# Patient Record
Sex: Female | Born: 1940
Health system: Southern US, Community
[De-identification: ages and names within clinical notes are randomized; demographics above are authoritative.]

## PROBLEM LIST (undated history)

## (undated) DIAGNOSIS — E785 Hyperlipidemia, unspecified: Secondary | ICD-10-CM

## (undated) DIAGNOSIS — I10 Essential (primary) hypertension: Secondary | ICD-10-CM

## (undated) DIAGNOSIS — J45909 Unspecified asthma, uncomplicated: Secondary | ICD-10-CM

## (undated) DIAGNOSIS — H269 Unspecified cataract: Secondary | ICD-10-CM

## (undated) DIAGNOSIS — R000 Tachycardia, unspecified: Secondary | ICD-10-CM

## (undated) DIAGNOSIS — M8580 Other specified disorders of bone density and structure, unspecified site: Secondary | ICD-10-CM

## (undated) HISTORY — DX: Tachycardia, unspecified: R00.0

## (undated) HISTORY — DX: Hyperlipidemia, unspecified: E78.5

## (undated) HISTORY — PX: ABDOMINAL HYSTERECTOMY: SHX81

## (undated) HISTORY — DX: Unspecified asthma, uncomplicated: J45.909

## (undated) HISTORY — DX: Essential (primary) hypertension: I10

## (undated) HISTORY — PX: EYE SURGERY: SHX253

## (undated) HISTORY — PX: TOE SURGERY: SHX1073

## (undated) HISTORY — DX: Unspecified cataract: H26.9

---
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
## 2001-02-04 ENCOUNTER — Emergency Department (HOSPITAL_COMMUNITY): Admission: EM | Admit: 2001-02-04 | Discharge: 2001-02-05 | Payer: Self-pay | Admitting: Emergency Medicine

## 2001-02-09 ENCOUNTER — Encounter: Admission: RE | Admit: 2001-02-09 | Discharge: 2001-02-09 | Payer: Self-pay | Admitting: General Practice

## 2001-02-09 ENCOUNTER — Encounter: Payer: Self-pay | Admitting: General Practice

## 2009-04-15 ENCOUNTER — Emergency Department (HOSPITAL_COMMUNITY): Admission: EM | Admit: 2009-04-15 | Discharge: 2009-04-15 | Payer: Self-pay | Admitting: Emergency Medicine

## 2011-02-24 HISTORY — PX: EYE SURGERY: SHX253

## 2012-01-18 ENCOUNTER — Ambulatory Visit: Payer: PRIVATE HEALTH INSURANCE

## 2012-01-18 ENCOUNTER — Ambulatory Visit (INDEPENDENT_AMBULATORY_CARE_PROVIDER_SITE_OTHER): Payer: PRIVATE HEALTH INSURANCE | Admitting: Family Medicine

## 2012-01-18 VITALS — BP 118/86 | HR 82 | Temp 98.4°F | Resp 18 | Ht 59.0 in | Wt 220.0 lb

## 2012-01-18 DIAGNOSIS — R062 Wheezing: Secondary | ICD-10-CM

## 2012-01-18 DIAGNOSIS — J45909 Unspecified asthma, uncomplicated: Secondary | ICD-10-CM

## 2012-01-18 DIAGNOSIS — R0989 Other specified symptoms and signs involving the circulatory and respiratory systems: Secondary | ICD-10-CM

## 2012-01-18 LAB — POCT CBC
Granulocyte percent: 68.6 %G (ref 37–80)
HCT, POC: 46.4 % (ref 37.7–47.9)
Hemoglobin: 14 g/dL (ref 12.2–16.2)
Lymph, poc: 2 (ref 0.6–3.4)
MCH, POC: 28.4 pg (ref 27–31.2)
MCHC: 30.2 g/dL — AB (ref 31.8–35.4)
MCV: 94.1 fL (ref 80–97)
MID (cbc): 0.7 (ref 0–0.9)
MPV: 8.5 fL (ref 0–99.8)
POC Granulocyte: 5.9 (ref 2–6.9)
POC LYMPH PERCENT: 23.7 %L (ref 10–50)
POC MID %: 7.7 %M (ref 0–12)
Platelet Count, POC: 237 10*3/uL (ref 142–424)
RBC: 4.93 M/uL (ref 4.04–5.48)
RDW, POC: 15.6 %
WBC: 806 10*3/uL — AB (ref 4.6–10.2)

## 2012-01-18 MED ORDER — AZITHROMYCIN 250 MG PO TABS: ORAL_TABLET | ORAL | Status: DC

## 2012-01-18 MED ORDER — ALBUTEROL SULFATE HFA 108 (90 BASE) MCG/ACT IN AERS: 2.0000 | INHALATION_SPRAY | Freq: Four times a day (QID) | RESPIRATORY_TRACT | Status: DC | PRN

## 2012-01-18 MED ORDER — METHYLPREDNISOLONE SODIUM SUCC 125 MG IJ SOLR
125.0000 mg | Freq: Once | INTRAMUSCULAR | Status: AC
  Administered 2012-01-18: 125 mg via INTRAMUSCULAR

## 2012-01-18 MED ORDER — HYDROCODONE-HOMATROPINE 5-1.5 MG/5ML PO SYRP: 5.0000 mL | ORAL_SOLUTION | Freq: Three times a day (TID) | ORAL | Status: DC | PRN

## 2012-01-18 MED ORDER — ALBUTEROL SULFATE (2.5 MG/3ML) 0.083% IN NEBU
2.5000 mg | INHALATION_SOLUTION | Freq: Once | RESPIRATORY_TRACT | Status: AC
  Administered 2012-01-18: 2.5 mg via RESPIRATORY_TRACT

## 2012-01-18 MED ORDER — PREDNISONE 20 MG PO TABS
ORAL_TABLET | ORAL | Status: DC
Start: 2012-01-18 — End: 2015-03-14

## 2012-01-18 NOTE — Progress Notes (Signed)
Subjective: 71 year old lady who is here with a four-day history of cough wheezing and congestion. She has a history of a previous pneumonia last time she was here. She is starting to cough up more yellowish mucus, having been clear originally. She has felt a little chilled but does not know of having a fever. She likely is on Thursday before this started. She does not smoke and never has. Her husband has not been sick.  Objective: Pleasant elderly lady in no acute distress. She is obese. TMs normal. Throat clear. Neck supple without nodes thyromegaly. Chest has some rhonchi throughout both lungs, with scattered wheezing also. Heart regular without murmurs. Heart was a little hard to hear due to the noisy lungs.  [facility] reading (PRIMARY) by  Dr. Alwyn Ren Prominent bronchovascular markings.   Assessment: Cough Rhonchi Wheezes  Plan: Chest x-ray CBC Nebulizer albuterol treatment  Improved after nebulizer but still a little rhonchous  Will give solumedrol Prednisone taper zpak Hycodan albuterol

## 2012-01-18 NOTE — Patient Instructions (Addendum)
Drink plenty of fluids  Get enough rest  Use the albuterol inhaler every 4-6 hours as needed for wheezing and coughing  Take the antibiotics 2 today, then one daily for 4 days  Use the cough syrup only when needed for bad coughing  Take the prednisone starting tomorrow morning 3 daily for 2 days, 2 daily for 2 days, one daily for 2 days  Return if worse or short of breath

## 2014-06-26 ENCOUNTER — Encounter: Payer: Self-pay | Admitting: Podiatry

## 2014-06-26 ENCOUNTER — Ambulatory Visit (INDEPENDENT_AMBULATORY_CARE_PROVIDER_SITE_OTHER): Payer: PPO | Admitting: Podiatry

## 2014-06-26 VITALS — BP 124/74 | HR 82 | Resp 15

## 2014-06-26 DIAGNOSIS — L600 Ingrowing nail: Secondary | ICD-10-CM | POA: Diagnosis not present

## 2014-06-26 DIAGNOSIS — L84 Corns and callosities: Secondary | ICD-10-CM | POA: Diagnosis not present

## 2014-06-26 NOTE — Progress Notes (Signed)
Subjective:     Patient ID: Gina Guerrero, female   DOB: 1940/04/26, 74 y.o.   MRN: [id number]  HPI patient states the ingrown on both my feet are really bothering me and making it hard to wear shoe gear and I've tried to trim them out myself. Also complains of painful calluses   Review of Systems  All other systems reviewed and are negative.      Objective:   Physical Exam  Constitutional: She is oriented to person, place, and time.  Cardiovascular: Intact distal pulses.   Musculoskeletal: Normal range of motion.  Neurological: She is oriented to person, place, and time.  Skin: Skin is warm.  Nursing note and vitals reviewed.  neurovascular status intact with muscle strength adequate range of motion within normal limits. Patient's noted to have incurvated hallux nails bilateral medial borders that are painful when pressed and makes shoe gear difficult. Patient's noted to have keratotic lesion sub-5 metatarsal bilateral and has good digital perfusion and is well oriented 3     Assessment:     Ingrown toenail deformity hallux bilateral with keratotic lesion sub-fifth metatarsals bilateral that are painful    Plan:     H&P and conditions discussed with patient. Discussed treatment options including permanent correction which patient wants and explained risk of procedure. Today I infiltrated each hallux 60 Milligan times like Marcaine mixture remove the medial borders exposed matrix and applied phenol 3 applications 30 seconds followed by alcohol lavage and sterile dressing. Gave instructions on soaks and reappoint

## 2014-06-26 NOTE — Progress Notes (Signed)
   Subjective:    Patient ID: Gina Guerrero, female    DOB: 20-Apr-1940, 74 y.o.   MRN: [id number]  HPI Pt presents with bilateral ingrown great nails, medial borders, also has painful callus bilateral 5th met   Review of Systems  Musculoskeletal: Positive for back pain.  All other systems reviewed and are negative.      Objective:   Physical Exam        Assessment & Plan:

## 2014-06-26 NOTE — Patient Instructions (Signed)

## 2015-01-24 ENCOUNTER — Ambulatory Visit (INDEPENDENT_AMBULATORY_CARE_PROVIDER_SITE_OTHER): Payer: PPO

## 2015-01-24 ENCOUNTER — Ambulatory Visit (INDEPENDENT_AMBULATORY_CARE_PROVIDER_SITE_OTHER): Payer: PPO | Admitting: Family Medicine

## 2015-01-24 VITALS — BP 120/72 | HR 74 | Temp 97.9°F | Resp 18 | Ht 59.75 in | Wt 227.2 lb

## 2015-01-24 DIAGNOSIS — G8929 Other chronic pain: Secondary | ICD-10-CM

## 2015-01-24 DIAGNOSIS — M76822 Posterior tibial tendinitis, left leg: Secondary | ICD-10-CM | POA: Diagnosis not present

## 2015-01-24 DIAGNOSIS — M79672 Pain in left foot: Secondary | ICD-10-CM

## 2015-01-24 DIAGNOSIS — L989 Disorder of the skin and subcutaneous tissue, unspecified: Secondary | ICD-10-CM

## 2015-01-24 MED ORDER — CLOBETASOL PROPIONATE 0.05 % EX CREA: 1.0000 "application " | TOPICAL_CREAM | Freq: Two times a day (BID) | CUTANEOUS | Status: DC

## 2015-01-24 MED ORDER — DICLOFENAC SODIUM 75 MG PO TBEC: 75.0000 mg | DELAYED_RELEASE_TABLET | Freq: Two times a day (BID) | ORAL | Status: DC

## 2015-01-24 NOTE — Progress Notes (Addendum)
Patient ID: Gina Guerrero, female    DOB: [birth year]  Age: 74 y.o. MRN: [id number]  Chief Complaint  Patient presents with  . Foot Pain    Left foot, pain and burning sensation around ankle. x2 months     Subjective:   Gina Guerrero who is been having problems for several months with pain in her left foot. Gina Guerrero has felt like it felt hot at times on the anterior aspect of ankle. Gina Guerrero knows of no specific injury. Gina Guerrero hurts primarily on the medial aspect of the left heel. Thought it might be just a ankle spur, but with more intense and a little bit of swelling in the ankle Gina Guerrero wanted checked.  Gina Guerrero also has a little skin lesion on her chest wall midsternum. Is gotten gradually more prominent. Gina Guerrero says it itches some.  Current allergies, medications, problem list, past/family and social histories reviewed.  Objective:  BP 120/72 mmHg  Pulse 74  Temp(Src) 97.9 F (36.6 C) (Oral)  Resp 18  Ht 4' 11.75" (1.518 m)  Wt 227 lb 3.2 oz (103.057 kg)  BMI 44.72 kg/m2  SpO2 97%  No major acute problems. Has a 1.5 cm by about 3 or 4 mm lesion transversely between the upper part of her breasts on the sternum. It looks like a little keloid though there is no history of trauma. Gina Guerrero scratches at it occasionally.  The ankle is tender on the medial aspect  Assessment & Plan:   Assessment: 1. Heel pain, chronic, left   2. Skin lesion of chest wall   3. Tibialis posterior tendonitis, left       Plan:   Orders Placed This Encounter  Procedures  . DG Ankle Complete Left    Order Specific Question:  Reason for Exam (SYMPTOM  OR DIAGNOSIS REQUIRED)    Answer:  left heel pain, ankle swelling and warmth    Order Specific Question:  Preferred imaging location?    Answer:  External  . DG Os Calcis Left    Standing Status: Future     Number of Occurrences: 1     Standing Expiration Date: 01/24/2016    Order Specific Question:  Reason for Exam (SYMPTOM  OR DIAGNOSIS REQUIRED)    Answer:  left heel  pain, ankle swelling and warmth    Order Specific Question:  Preferred imaging location?    Answer:  External  . Uric acid    Meds ordered this encounter  Medications  . diclofenac (VOLTAREN) 75 MG EC tablet    Sig: Take 1 tablet (75 mg total) by mouth 2 (two) times daily.    Dispense:  20 tablet    Refill:  0  . clobetasol cream (TEMOVATE) 0.05 %    Sig: Apply 1 application topically 2 (two) times daily. Apply a small amount rubbed in twice daily to skin lesion on chest wall    Dispense:  15 g    Refill:  1         Patient Instructions  Take the diclofenac one twice daily with food for heel pain and inflammation, if stomach pain develops discontinue immediately.  Wear the ankle splint as much of the time as possible  Return if not improved in 2-3 weeks  Use the clobetasol cream on the chest wall twice daily as directed  If the chest wall does not improve we will either take a biopsy of the place or send you to a dermatologist.   [facility] reading (PRIMARY) by  Dr. Linna Darner No acute bony problem.  Heel spurs..     Return in about 2 weeks (around 02/07/2015), or if symptoms worsen or fail to improve.   Eswin Worrell, MD 01/24/2015

## 2015-01-24 NOTE — Patient Instructions (Signed)
Take the diclofenac one twice daily with food for heel pain and inflammation, if stomach pain develops discontinue immediately.  Wear the ankle splint as much of the time as possible  Return if not improved in 2-3 weeks  Use the clobetasol cream on the chest wall twice daily as directed  If the chest wall does not improve we will either take a biopsy of the place or send you to a dermatologist.

## 2015-01-25 LAB — URIC ACID: Uric Acid, Serum: 4.8 mg/dL (ref 2.4–7.0)

## 2015-03-14 ENCOUNTER — Ambulatory Visit (INDEPENDENT_AMBULATORY_CARE_PROVIDER_SITE_OTHER): Payer: PPO | Admitting: Family Medicine

## 2015-03-14 ENCOUNTER — Ambulatory Visit (INDEPENDENT_AMBULATORY_CARE_PROVIDER_SITE_OTHER): Payer: PPO

## 2015-03-14 VITALS — BP 136/88 | HR 77 | Temp 97.5°F | Resp 20 | Ht 59.0 in | Wt 229.6 lb

## 2015-03-14 DIAGNOSIS — M5412 Radiculopathy, cervical region: Secondary | ICD-10-CM

## 2015-03-14 DIAGNOSIS — M79644 Pain in right finger(s): Secondary | ICD-10-CM

## 2015-03-14 DIAGNOSIS — M542 Cervicalgia: Secondary | ICD-10-CM

## 2015-03-14 DIAGNOSIS — M25511 Pain in right shoulder: Secondary | ICD-10-CM

## 2015-03-14 DIAGNOSIS — Z23 Encounter for immunization: Secondary | ICD-10-CM | POA: Diagnosis not present

## 2015-03-14 DIAGNOSIS — M47812 Spondylosis without myelopathy or radiculopathy, cervical region: Secondary | ICD-10-CM

## 2015-03-14 DIAGNOSIS — M4692 Unspecified inflammatory spondylopathy, cervical region: Secondary | ICD-10-CM | POA: Diagnosis not present

## 2015-03-14 MED ORDER — MELOXICAM 7.5 MG PO TABS: 7.5000 mg | ORAL_TABLET | Freq: Every day | ORAL | Status: DC

## 2015-03-14 MED ORDER — PREDNISONE 20 MG PO TABS: ORAL_TABLET | ORAL | Status: DC

## 2015-03-14 NOTE — Patient Instructions (Signed)
Take the prednisone 3 pills daily for 2 days, then 2 daily for 2 days, then 1 daily for 2 days. Best taken after eating breakfast. For inflammation of the nerve  Take the meloxicam 1 daily after your main meal for pain and inflammation  In addition to this you can take some Tylenol if needed for additional pain, maximum of 2 pills 3 times daily. Do not take ibuprofen along with the meloxicam.  If you're not doing much better in 2 weeks please return, or come back sooner if you're having worse problems.  You will receive a flu shot today.
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
## 2015-05-03 ENCOUNTER — Ambulatory Visit (INDEPENDENT_AMBULATORY_CARE_PROVIDER_SITE_OTHER): Payer: PPO | Admitting: Podiatry

## 2015-05-03 ENCOUNTER — Encounter: Payer: Self-pay | Admitting: Podiatry

## 2015-05-03 VITALS — BP 120/86 | HR 101 | Resp 12

## 2015-05-03 DIAGNOSIS — L600 Ingrowing nail: Secondary | ICD-10-CM | POA: Diagnosis not present

## 2015-05-03 NOTE — Patient Instructions (Addendum)

## 2015-05-04 NOTE — Progress Notes (Signed)
Subjective:     Patient ID: Gina Guerrero, female   DOB: 15-Nov-1940, 75 y.o.   MRN: [id number]  HPI patient states again and I'm getting pain on the inside of his right big toe and I feel like there is some scar tissue   Review of Systems     Objective:   Physical Exam Neurovascular status intact with some irritation around the medial border of the right hallux with thickened type tissue consistent with a scar like tissue which can unfortunately happened with skin conditions    Assessment:     Combination ingrown toenail with scar tissue formation    Plan:     We are going to take slightly more nail and I explained procedure and risk and the fact it may not solve the problem. Today I infiltrated the right hallux 60 mg I can Marcaine mixture removed the medial border proud flesh and created a channel and applied phenol 3 applications 30 seconds followed by alcohol lavage and sterile dressing gave instructions on soaks and reappoint and we'll reevaluate again in the future if symptoms persist

## 2015-10-11 ENCOUNTER — Ambulatory Visit (INDEPENDENT_AMBULATORY_CARE_PROVIDER_SITE_OTHER): Payer: PPO | Admitting: Urgent Care

## 2015-10-11 VITALS — BP 121/77 | HR 85 | Temp 97.8°F | Resp 16 | Ht 59.0 in | Wt 229.0 lb

## 2015-10-11 DIAGNOSIS — I8390 Asymptomatic varicose veins of unspecified lower extremity: Secondary | ICD-10-CM

## 2015-10-11 DIAGNOSIS — I868 Varicose veins of other specified sites: Secondary | ICD-10-CM | POA: Diagnosis not present

## 2015-10-11 DIAGNOSIS — R58 Hemorrhage, not elsewhere classified: Secondary | ICD-10-CM | POA: Diagnosis not present

## 2015-10-11 LAB — CBC
HCT: 41.7 % (ref 35.0–45.0)
Hemoglobin: 14 g/dL (ref 11.7–15.5)
MCH: 29.3 pg (ref 27.0–33.0)
MCHC: 33.6 g/dL (ref 32.0–36.0)
MCV: 87.2 fL (ref 80.0–100.0)
MPV: 9.5 fL (ref 7.5–12.5)
Platelets: 228 10*3/uL (ref 140–400)
RBC: 4.78 MIL/uL (ref 3.80–5.10)
RDW: 14.3 % (ref 11.0–15.0)
WBC: 6.7 10*3/uL (ref 3.8–10.8)

## 2015-10-11 NOTE — Patient Instructions (Addendum)
Bleeding Varicose Veins Varicose veins are veins that have become enlarged and twisted. Valves in the veins help return blood from the leg to the heart. If these valves are damaged, blood flows backward and backs up into the veins in the leg near the skin. This causes the veins to become larger because of increased pressure within them. Sometimes these veins bleed. CAUSES  Factors that can lead to bleeding varicose veins include:  Thinning of the skin that covers the veins. This skin is stretched as the veins enlarge.  Weak and thinning walls of the varicose veins. These thin walls are part of the reason why blood is not flowing normally to the heart.  Having high pressure in the veins. This high pressure occurs because the blood is not flowing freely back up to the heart.  Injury. Even a small injury to a varicose vein can cause bleeding.  Open wounds. A sore may develop near a varicose vein and not heal. This makes bleeding more likely.  Taking medicine that thins the blood. These medicines may include aspirin, anti-inflammatory medicine, and other blood thinners. SIGNS AND SYMPTOMS  If bleeding is on the outside surface of the skin, blood can be seen. Sometimes, the bleeding stays under the skin. If this happens, the blue or purple area will spread beyond the vein. This discoloration may be visible. DIAGNOSIS  To decide if you have a bleeding varicose vein, your health care provider may:  Ask about your symptoms. This will include when you first saw bleeding.  Ask about how long you have had varicose veins and if they cause you problems.  Ask about your overall health.  Ask about possible causes, such as recent cuts or if the area near the varicose veins was bumped or injured.  Examine the skin or leg that concerns you. Your health care provider will probably feel the veins.  Order imaging tests. These create detailed pictures of the veins. TREATMENT  The first goal of treating  bleeding varicose veins is to stop the bleeding. Then, the aim is to keep any bleeding from happening again. Treatment will depend on the cause of the bleeding and how bad it is. Ask your health care provider about what would be best for you. Options include:  Raising (elevating) your leg. Lie down with your leg propped up on a pillow or cushion. Your foot should be above the level of your heart.  Applying pressure to the spot that is bleeding. The bleeding should stop in a short time.  Wearing elastic stockings that "compress" your legs (compression stockings). An elastic bandage may do the same thing.  Applying an antibiotic cream on sores that are not healing.  Closing off or surgically removing the bleeding varicose veins with one of the following:  Sclerotherapy. A solution is injected into the vein to close it off.  Laser treatment. A laser is used to heat the vein to close it off.  Radiofrequency vein ablation. An electrical current produced by radio waves is used to close off the vein.  Phlebectomy. The vein is surgically removed through small incisions made over the varicose vein.  Vein ligation and stripping. The vein is surgically removed through incisions made over the varicose vein after the vein has been tied (ligated). HOME CARE INSTRUCTIONS   Apply any creams that your health care provider prescribed. Follow the directions carefully.  Wear compression stockings or any wraps as directed by your health care provider. Make sure you know:  If   you should wear them every day.  How long you should wear them.  If veins were removed or closed, a bandage (dressing) will probably cover the area. Make sure you know:  How often the dressing should be changed.  Whether the area can get wet.  When you can leave the skin uncovered.  Check your skin every day. Look for new sores and signs of bleeding.  To prevent future bleeding:  Use extra care in situations where you could  cut your legs, such as when shaving or gardening.  Try to keep your legs elevated as much as possible. Lie down when you can. SEEK MEDICAL CARE IF:   Your veins continue to bleed.  You develop new sores near your varicose veins.  You have a sore that does not heal or gets bigger.  You have increased pain in your leg.  The area around a varicose vein becomes warm, red, or tender to the touch.  You notice a yellowish fluid that smells bad coming from a spot where there was bleeding.  You have a fever. SEEK IMMEDIATE MEDICAL CARE IF:   You have chest pain or difficulty breathing.  You have severe leg pain.   This information is not intended to replace advice given to you by your health care provider. Make sure you discuss any questions you have with your health care provider.   Document Released: 06/28/2008 Document Revised: 03/02/2014 Document Reviewed: 06/13/2013 Elsevier Interactive Patient Education 2016 [employer].     IF you received an x-ray today, you will receive an invoice from [facility]. Please contact [facility] at [phone] with questions or concerns regarding your invoice.   IF you received labwork today, you will receive an invoice from [employer]. Please contact [facility] at [phone] with questions or concerns regarding your invoice.   Our billing staff will not be able to assist you with questions regarding bills from these companies.  You will be contacted with the lab results as soon as they are available. The fastest way to get your results is to activate your My Chart account. Instructions are located on the last page of this paperwork. If you have not heard from [place] regarding the results in 2 weeks, please contact this office.     We recommend that you schedule a mammogram for breast cancer screening. Typically, you do not need a referral to do this. Please contact a local imaging center to schedule  your mammogram.  [facility] - [phone]  *ask for the Radiology Department [facility] - [phone] or [phone]  [facility] - [phone] [place] [phone] [facility] - [phone]  *ask for the [facility] - [phone]  *ask for the Radiology Department [facility] - [phone]  *ask for the [facility] [place] - [phone]

## 2015-10-11 NOTE — Progress Notes (Signed)
    MRN: [id number] DOB: January 25, 1941  Subjective:   Gina Guerrero is a 75 y.o. female presenting for chief complaint of Mass (bump on left ankle x 6 months, hot to touch )  Reports ~6 month history of swelling over her left ankle that started out like a pimple like lesion. It has not gotten larger but does drain and bleeds especially when she bumps or scrapes her ankle. There is intermittent warmth over the area as well. Has not tried medications, creams. Denies fever, pain, trauma, drainage of pus, malodorous discharge.  Gina Guerrero has a current medication list which includes the following prescription(s): clobetasol cream and meloxicam. Also has No Known Allergies.  [place]  has a past medical history of Cataract. Also  has a past surgical history that includes Abdominal hysterectomy and Eye surgery.  Objective:   Vitals: BP 121/77 (BP Location: Left Arm, Patient Position: Sitting, Cuff Size: Large)   Pulse 85   Temp 97.8 F (36.6 C) (Oral)   Resp 16   Ht 4\' 11"  (1.499 m)   Wt 229 lb (103.9 kg)   SpO2 96%   BMI 46.25 kg/m   Physical Exam  Constitutional: She is oriented to person, place, and time. She appears well-developed and well-nourished.  Cardiovascular: Normal rate.   Pulmonary/Chest: Effort normal.  Neurological: She is alert and oriented to person, place, and time.  Skin:      Assessment and Plan :   1. Varicose veins 2. Bleeding - Recommended conservative management. Likely has a superficial varicose vein that bleeds with trauma. Labs pending.   Jaynee Eagles, PA-C [facility] [phone] 10/11/2015 3:14 PM

## 2015-10-12 LAB — PROTIME-INR
INR: 1
Prothrombin Time: 10.7 s (ref 9.0–11.5)

## 2015-10-14 ENCOUNTER — Encounter: Payer: Self-pay | Admitting: Urgent Care

## 2015-10-16 NOTE — Progress Notes (Signed)
Medical screening examination/treatment/procedure(s) were performed by a resident physician or non-physician practitioner and as the supervising physician I was immediately available for consultation/collaboration.  Lynne Leader, MD

## 2015-12-30 ENCOUNTER — Ambulatory Visit (INDEPENDENT_AMBULATORY_CARE_PROVIDER_SITE_OTHER): Payer: PPO | Admitting: Podiatry

## 2015-12-30 ENCOUNTER — Encounter: Payer: Self-pay | Admitting: Podiatry

## 2015-12-30 DIAGNOSIS — M79674 Pain in right toe(s): Secondary | ICD-10-CM

## 2015-12-30 DIAGNOSIS — M79675 Pain in left toe(s): Secondary | ICD-10-CM | POA: Diagnosis not present

## 2015-12-30 DIAGNOSIS — M205X9 Other deformities of toe(s) (acquired), unspecified foot: Secondary | ICD-10-CM | POA: Diagnosis not present

## 2015-12-30 DIAGNOSIS — L84 Corns and callosities: Secondary | ICD-10-CM | POA: Diagnosis not present

## 2015-12-30 NOTE — Patient Instructions (Signed)

## 2016-01-02 ENCOUNTER — Telehealth: Payer: Self-pay | Admitting: *Deleted

## 2016-01-02 NOTE — Telephone Encounter (Signed)
"  I am calling to cancel patient's surgery scheduled for 01/09/2016.  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 She said she will call back later to reschedule ."  I will let Dr. Amalia Hailey know.

## 2016-01-04 NOTE — Progress Notes (Signed)
Subjective: Patient presents today for follow-up evaluation of painful callus deformities to the fifth digits bilaterally. Patient states that she does have hammertoe deformities of the bilateral fifth digits. She is been treated multiple times in the past and tried all conservative measures to alleviate symptoms. However, she still has pain when ambulating in shoe gear and significant pain relating to her hammertoes of the bilateral fifth digits with overlying corn formation. Patient presents today to discuss surgical option of hammertoe correction.   Objective/Physical Exam General: The patient is alert and oriented x3 in no acute distress.  Dermatology: Skin is warm, dry and supple bilateral lower extremities. Negative for open lesions or macerations.  Vascular: Palpable pedal pulses bilaterally. No edema or erythema noted. Capillary refill within normal limits.  Neurological: Epicritic and protective threshold grossly intact bilaterally.   Musculoskeletal Exam: Range of motion within normal limits to all pedal and ankle joints bilateral. Muscle strength 5/5 in all groups bilateral.  Structural adductovarus deformity noted to the bilateral fifth digits with overlying callus formation at the level of the PIPJ. Pain on palpation noted.  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 All patient questions were answered #2 today painful callus lesions were debrided using an chisel blade without incident. #3 discussed in detail the conservative versus surgical management of hammertoe correction to the bilateral fifth digits. Patient opts for surgical correction. All conservative modalities have been unsuccessful in providing any sort of satisfactory alleviation of symptoms for the patient. The patient was told of the benefits as well as possible side effects of the procedure. No guarantees were  expressed or implied. #4 postoperative shoes dispensed for bilateral procedures #5 authorization for surgical intervention was initiated today #6 surgery will consist of derotational arthroplasty third bilateral fifth digits #7 return to clinic 1 week postop   Dr. Edrick Kins, [place]

## 2016-02-03 ENCOUNTER — Ambulatory Visit (INDEPENDENT_AMBULATORY_CARE_PROVIDER_SITE_OTHER): Payer: PPO | Admitting: Physician Assistant

## 2016-02-03 ENCOUNTER — Ambulatory Visit (INDEPENDENT_AMBULATORY_CARE_PROVIDER_SITE_OTHER): Payer: PPO

## 2016-02-03 VITALS — BP 118/78 | HR 100 | Temp 97.8°F | Resp 17 | Ht 59.0 in | Wt 225.0 lb

## 2016-02-03 DIAGNOSIS — R059 Cough, unspecified: Secondary | ICD-10-CM

## 2016-02-03 DIAGNOSIS — R05 Cough: Secondary | ICD-10-CM

## 2016-02-03 MED ORDER — BENZONATATE 100 MG PO CAPS: 100.0000 mg | ORAL_CAPSULE | Freq: Three times a day (TID) | ORAL | 0 refills | Status: DC | PRN

## 2016-02-03 MED ORDER — DOXYCYCLINE HYCLATE 100 MG PO CAPS: 100.0000 mg | ORAL_CAPSULE | Freq: Two times a day (BID) | ORAL | 0 refills | Status: DC

## 2016-02-03 MED ORDER — GUAIFENESIN ER 1200 MG PO TB12: 1.0000 | ORAL_TABLET | Freq: Two times a day (BID) | ORAL | 1 refills | Status: DC | PRN

## 2016-02-03 NOTE — Progress Notes (Signed)
Patient ID: Gina Guerrero, female    DOB: 06/23/1940, 75 y.o.   MRN: [id number]  PCP: No PCP Per Patient  Chief Complaint  Patient presents with  . Cough    and wheezing. Sore throat. Since last tuesday     Subjective:   Presents for evaluation of cough and wheezing x 1 week.  Cough produces green sputum and causes chest soreness. Associated with wheezing and gag-emesis, sore throat, LEFT ear pain, chills without fever, achy muscles.  No diarrhea. No nausea.  OTC meds without relief.    Review of Systems As above    There are no active problems to display for this patient.    Prior to Admission medications   Medication Sig Start Date End Date Taking? Authorizing Provider  clobetasol cream (TEMOVATE) [id number] % Apply 1 application topically 2 (two) times daily. Apply a small amount rubbed in twice daily to skin lesion on chest wall 01/24/15  Yes Posey Boyer, MD     No Known Allergies     Objective:  Physical Exam  Constitutional: She is oriented to person, place, and time. She appears well-developed and well-nourished. No distress.  BP 118/78 (BP Location: Right Arm, Patient Position: Sitting, Cuff Size: Large)   Pulse 100   Temp 97.8 F (36.6 C) (Oral)   Resp 17   Ht 4\' 11"  (1.499 m)   Wt 225 lb (102.1 kg)   SpO2 96%   BMI 45.44 kg/m    HENT:  Head: Normocephalic and atraumatic.  Right Ear: Hearing, tympanic membrane, external ear and ear canal normal.  Left Ear: Hearing, tympanic membrane, external ear and ear canal normal.  Nose: Mucosal edema and rhinorrhea present.  No foreign bodies. Right sinus exhibits no maxillary sinus tenderness and no frontal sinus tenderness. Left sinus exhibits no maxillary sinus tenderness and no frontal sinus tenderness.  Mouth/Throat: Uvula is midline, oropharynx is clear and moist and mucous membranes are normal. No uvula swelling. No oropharyngeal exudate.  Eyes: Conjunctivae and EOM are normal. Pupils are equal, round,  and reactive to light. Right eye exhibits no discharge. Left eye exhibits no discharge. No scleral icterus.  Neck: Trachea normal, normal range of motion and full passive range of motion without pain. Neck supple. No thyroid mass and no thyromegaly present.  Cardiovascular: Normal rate, regular rhythm and normal heart sounds.   Pulmonary/Chest: Effort normal and breath sounds normal. No respiratory distress. She has no wheezes.  Lymphadenopathy:       Head (right side): No submandibular, no tonsillar, no preauricular, no posterior auricular and no occipital adenopathy present.       Head (left side): No submandibular, no tonsillar, no preauricular and no occipital adenopathy present.    She has no cervical adenopathy.       Right: No supraclavicular adenopathy present.       Left: No supraclavicular adenopathy present.  Neurological: She is alert and oriented to person, place, and time. She has normal strength. No cranial nerve deficit or sensory deficit.  Skin: Skin is warm, dry and intact. No rash noted.  Psychiatric: She has a normal mood and affect. Her speech is normal and behavior is normal.       Dg Chest 2 View  Result Date: 02/03/2016 CLINICAL DATA:  Cough EXAM: CHEST  2 VIEW COMPARISON:  01/18/2012 FINDINGS: Heart and mediastinal contours are within normal limits. No focal opacities or effusions. No acute bony abnormality. IMPRESSION: No active cardiopulmonary disease. Electronically Signed  By: Rolm Baptise M.D.   On: 02/03/2016 16:43       Assessment & Plan:   1. Cough Cover for atypicals with doxycycline, which should also address sinusitis, another possible source of her symptoms. Supportive care. RTC if symptoms worsen/persist. - DG Chest 2 View; Future - doxycycline (VIBRAMYCIN) 100 MG capsule; Take 1 capsule (100 mg total) by mouth 2 (two) times daily.  Dispense: 20 capsule; Refill: 0 - benzonatate (TESSALON) 100 MG capsule; Take 1-2 capsules (100-200 mg total) by  mouth 3 (three) times daily as needed for cough.  Dispense: 40 capsule; Refill: 0 - Guaifenesin (MUCINEX MAXIMUM STRENGTH) 1200 MG TB12; Take 1 tablet (1,200 mg total) by mouth every 12 (twelve) hours as needed.  Dispense: 14 tablet; Refill: 1   Fara Chute, PA-C Physician Assistant-Certified [facility]

## 2016-02-03 NOTE — Patient Instructions (Addendum)
Get plenty of rest and drink at least 64 ounces of water daily.     IF you received an x-ray today, you will receive an invoice from [facility]. Please contact [facility] at [phone] with questions or concerns regarding your invoice.   IF you received labwork today, you will receive an invoice from [facility]. Please contact [facility] at [phone] with questions or concerns regarding your invoice.   Our billing staff will not be able to assist you with questions regarding bills from these companies.  You will be contacted with the lab results as soon as they are available. The fastest way to get your results is to activate your My Chart account. Instructions are located on the last page of this paperwork. If you have not heard from us regarding the results in 2 weeks, please contact this office.     

## 2016-02-05 ENCOUNTER — Telehealth: Payer: Self-pay

## 2016-02-05 NOTE — Telephone Encounter (Signed)
Pt states she feel no better than yesterday coughing of colored mucus  Best number [phone]

## 2016-02-06 NOTE — Telephone Encounter (Signed)
Pt is taking medication prescribed. Ear ache and sore throat has cleared up. Slight SOB noted with increased clear/yellow phlegm. Advised to continue taking medication, drink plenty of fluids and return to clinic on Saturday if cough, sob worsens. Verbalized understanding

## 2016-02-13 ENCOUNTER — Ambulatory Visit (INDEPENDENT_AMBULATORY_CARE_PROVIDER_SITE_OTHER): Payer: PPO | Admitting: Family Medicine

## 2016-02-13 VITALS — BP 120/82 | HR 88 | Temp 98.2°F | Resp 18 | Ht 59.0 in | Wt 227.2 lb

## 2016-02-13 DIAGNOSIS — R0602 Shortness of breath: Secondary | ICD-10-CM

## 2016-02-13 DIAGNOSIS — R05 Cough: Secondary | ICD-10-CM

## 2016-02-13 DIAGNOSIS — J45901 Unspecified asthma with (acute) exacerbation: Secondary | ICD-10-CM

## 2016-02-13 DIAGNOSIS — E669 Obesity, unspecified: Secondary | ICD-10-CM | POA: Diagnosis not present

## 2016-02-13 DIAGNOSIS — IMO0001 Reserved for inherently not codable concepts without codable children: Secondary | ICD-10-CM

## 2016-02-13 DIAGNOSIS — R059 Cough, unspecified: Secondary | ICD-10-CM

## 2016-02-13 DIAGNOSIS — R062 Wheezing: Secondary | ICD-10-CM | POA: Diagnosis not present

## 2016-02-13 LAB — POCT CBC
Granulocyte percent: 65.9 %G (ref 37–80)
HCT, POC: 41.1 % (ref 37.7–47.9)
Hemoglobin: 14.1 g/dL (ref 12.2–16.2)
Lymph, poc: 2.6 (ref 0.6–3.4)
MCH, POC: 29.7 pg (ref 27–31.2)
MCHC: 34.4 g/dL (ref 31.8–35.4)
MCV: 86.4 fL (ref 80–97)
MID (cbc): 0.2 (ref 0–0.9)
MPV: 6.9 fL (ref 0–99.8)
POC Granulocyte: 5.3 (ref 2–6.9)
POC LYMPH PERCENT: 32 %L (ref 10–50)
POC MID %: 2.1 %M (ref 0–12)
Platelet Count, POC: 244 10*3/uL (ref 142–424)
RBC: 4.75 M/uL (ref 4.04–5.48)
RDW, POC: 14.5 %
WBC: 8.1 10*3/uL (ref 4.6–10.2)

## 2016-02-13 LAB — GLUCOSE, POCT (MANUAL RESULT ENTRY): POC Glucose: 90 mg/dl (ref 70–99)

## 2016-02-13 MED ORDER — ALBUTEROL SULFATE HFA 108 (90 BASE) MCG/ACT IN AERS: 2.0000 | INHALATION_SPRAY | RESPIRATORY_TRACT | 1 refills | Status: DC | PRN

## 2016-02-13 MED ORDER — PREDNISONE 20 MG PO TABS: ORAL_TABLET | ORAL | 0 refills | Status: DC

## 2016-02-13 MED ORDER — AZITHROMYCIN 250 MG PO TABS
ORAL_TABLET | ORAL | 0 refills | Status: DC
Start: 2016-02-13 — End: 2016-02-21

## 2016-02-13 NOTE — Patient Instructions (Addendum)
Your symptoms are most consistent with a cough induced asthma. If you ever get feeling like you cannot breathe call 911 and go to the emergency room.  Drink plenty of fluids to keep well-hydrated, because that will keep any mucus in there thinner.  Use the albuterol inhaler 2 inhalations every 4-6 hours as directed. Blowout call your air, insert inhaler, as you breathing in squeeze the inhaler to make it fire and suck in all of the medications that you can, hold your breath for about 10 seconds before exhaling. Wait a couple of minutes before taking the second inhalation.  Take the azithromycin antibiotic 2 pills today, then 1 daily for 4 days. Take it with some food.  Take the prednisone 3 pills daily for 2 days, then 2 daily for 2 days, then 1 daily for 2 days. Best taken after breakfast, except take the first dose today with some food.  Return if worse or not improving    IF you received an x-ray today, you will receive an invoice from [facility]. Please contact [facility] at [phone] with questions or concerns regarding your invoice.   IF you received labwork today, you will receive an invoice from [facility]. Please contact [facility] at [phone] with questions or concerns regarding your invoice.   Our billing staff will not be able to assist you with questions regarding bills from these companies.  You will be contacted with the lab results as soon as they are available. The fastest way to get your results is to activate your My Chart account. Instructions are located on the last page of this paperwork. If you have not heard from [place] regarding the results in 2 weeks, please contact this office.

## 2016-02-13 NOTE — Progress Notes (Signed)
Patient ID: Gina Guerrero, female    DOB: Dec 06, 1940  Age: 75 y.o. MRN: [id number]  Chief Complaint  Patient presents with  . Follow-up    not any better, cold and hot chills and shortness of breath    Subjective:   Pleasant 75 year old lady well-known to me from previous encounters. She was here 10 days ago with a respiratory tract infection. Chest x-ray was clear. She was treated with doxycycline, but didn't sit well with her and she is not complaining of. She has continued to have coughing, though not running up a lot of phlegm. She has more problems at nighttime. She does not have any inhalers. She does not smoke or live with any smokers. She does have a history of having had an episode of asthma about 3 years ago we treated her for. She's been feeling hot and cold but no documented fevers.  Current allergies, medications, problem list, past/family and social histories reviewed.  Objective:  BP 120/82 (BP Location: Right Arm, Patient Position: Sitting, Cuff Size: Large)   Pulse 88   Temp 98.2 F (36.8 C) (Oral)   Resp 18   Ht 4\' 11"  (1.499 m)   Wt 227 lb 3.2 oz (103.1 kg)   SpO2 98%   BMI 45.89 kg/m   Afebrile as noted. Her TMs are normal. Throat clear. Neck supple without significant nodes. Chest has decreased air exchange but only a minimal wheeze. No rales. Heart regular without murmur. No ankle edema. Note that her O2 saturation was very good.  Assessment & Plan:   Assessment: 1. Cough   2. Wheezing   3. Shortness of breath   4. Class 3 obesity without serious comorbidity in adult, unspecified BMI, unspecified obesity type   5. Moderate asthma with acute exacerbation, unspecified whether persistent       Plan: Check a CBC on her. If it is normal will not order any additional x-rays. Results for orders placed or performed in visit on 02/13/16  POCT CBC  Result Value Ref Range   WBC 8.1 4.6 - 10.2 K/uL   Lymph, poc 2.6 0.6 - 3.4   POC LYMPH PERCENT 32.0 10 - 50 %L   MID (cbc) 0.2 0 - 0.9   POC MID % 2.1 0 - 12 %M   POC Granulocyte 5.3 2 - 6.9   Granulocyte percent 65.9 37 - 80 %G   RBC 4.75 4.04 - 5.48 M/uL   Hemoglobin 14.1 12.2 - 16.2 g/dL   HCT, POC 41.1 37.7 - 47.9 %   MCV 86.4 80 - 97 fL   MCH, POC 29.7 27 - 31.2 pg   MCHC 34.4 31.8 - 35.4 g/dL   RDW, POC 14.5 %   Platelet Count, POC 244 142 - 424 K/uL   MPV 6.9 0 - 99.8 fL  POCT glucose (manual entry)  Result Value Ref Range   POC Glucose 90 70 - 99 mg/dl     Orders Placed This Encounter  Procedures  . POCT CBC  . POCT glucose (manual entry)    No orders of the defined types were placed in this encounter.        Patient Instructions   Your symptoms are most consistent with a cough induced asthma. If you ever get feeling like you cannot breathe call 911 and go to the emergency room.  Drink plenty of fluids to keep well-hydrated, because that will keep any mucus in there thinner.  Use the albuterol inhaler 2 inhalations every  4-6 hours as directed. Blowout call your air, insert inhaler, as you breathing in squeeze the inhaler to make it fire and suck in all of the medications that you can, hold your breath for about 10 seconds before exhaling. Wait a couple of minutes before taking the second inhalation.  Take the azithromycin antibiotic 2 pills today, then 1 daily for 4 days. Take it with some food.  Take the prednisone 3 pills daily for 2 days, then 2 daily for 2 days, then 1 daily for 2 days. Best taken after breakfast, except take the first dose today with some food.  Return if worse or not improving    IF you received an x-ray today, you will receive an invoice from [facility]. Please contact [facility] at [phone] with questions or concerns regarding your invoice.   IF you received labwork today, you will receive an invoice from [place]. Please contact [facility] at [phone] with questions or concerns regarding your invoice.   Our billing  staff will not be able to assist you with questions regarding bills from these companies.  You will be contacted with the lab results as soon as they are available. The fastest way to get your results is to activate your My Chart account. Instructions are located on the last page of this paperwork. If you have not heard from [place] regarding the results in 2 weeks, please contact this office.         No Follow-up on file.   Yerachmiel Spinney, MD 02/13/2016

## 2016-02-19 ENCOUNTER — Telehealth: Payer: Self-pay

## 2016-02-19 NOTE — Telephone Encounter (Signed)
Patient called in stating she is still not getting any better, after looking through the chart I see she has been dealing with this going on about 2 weeks now.   She would like to have a nurse call her to see what she needs to do about getting something to make her feel better.  Her call back number is [phone]

## 2016-02-21 ENCOUNTER — Ambulatory Visit (INDEPENDENT_AMBULATORY_CARE_PROVIDER_SITE_OTHER): Payer: PPO | Admitting: Physician Assistant

## 2016-02-21 VITALS — BP 108/78 | Temp 98.2°F | Resp 18 | Ht 59.0 in | Wt 229.0 lb

## 2016-02-21 DIAGNOSIS — J040 Acute laryngitis: Secondary | ICD-10-CM

## 2016-02-21 DIAGNOSIS — R05 Cough: Secondary | ICD-10-CM

## 2016-02-21 DIAGNOSIS — R059 Cough, unspecified: Secondary | ICD-10-CM

## 2016-02-21 MED ORDER — ALBUTEROL SULFATE (2.5 MG/3ML) 0.083% IN NEBU
2.5000 mg | INHALATION_SOLUTION | Freq: Once | RESPIRATORY_TRACT | Status: AC
  Administered 2016-02-21: 2.5 mg via RESPIRATORY_TRACT

## 2016-02-21 MED ORDER — FLUTICASONE-SALMETEROL 250-50 MCG/DOSE IN AEPB: 1.0000 | INHALATION_SPRAY | Freq: Two times a day (BID) | RESPIRATORY_TRACT | 0 refills | Status: DC

## 2016-02-21 MED ORDER — HYDROCODONE-HOMATROPINE 5-1.5 MG/5ML PO SYRP: 5.0000 mL | ORAL_SOLUTION | Freq: Two times a day (BID) | ORAL | 0 refills | Status: DC | PRN

## 2016-02-21 MED ORDER — IPRATROPIUM BROMIDE 0.02 % IN SOLN
0.5000 mg | Freq: Once | RESPIRATORY_TRACT | Status: AC
  Administered 2016-02-21: 0.5 mg via RESPIRATORY_TRACT

## 2016-02-21 NOTE — Telephone Encounter (Signed)
Called pt who sounded hoarse and "tight" while speaking. She stated that she has not gotten any better on any of the treatments (pred/Abx) and may be even worse. Reported that she does get better briefly after using her albuterol inh. Advised pt that she should RTC to be re-checked and transferred to set appt time.

## 2016-02-21 NOTE — Patient Instructions (Signed)
     IF you received an x-ray today, you will receive an invoice from [facility]. Please contact [facility] at [phone] with questions or concerns regarding your invoice.   IF you received labwork today, you will receive an invoice from [facility]. Please contact [facility] at [phone] with questions or concerns regarding your invoice.   Our billing staff will not be able to assist you with questions regarding bills from these companies.  You will be contacted with the lab results as soon as they are available. The fastest way to get your results is to activate your My Chart account. Instructions are located on the last page of this paperwork. If you have not heard from us regarding the results in 2 weeks, please contact this office.     

## 2016-02-21 NOTE — Progress Notes (Signed)
Patient ID: Gina Guerrero, female    DOB: 06/21/1940, 75 y.o.   MRN: [id number]  PCP: No PCP Per Patient  Chief Complaint  Patient presents with  . Sore Throat    rt ear pain, hoarsess, SOB, x 2 weeks    Subjective:   Presents for evaluation of persistent illness.  "Can't catch my breath." Hoarse. Occasional RIGHT eat pain. cpugh is non-productive. "It's so tight."  Presented 02/03/2016 for evaluation of cough and wheezing x 1 week. Cough produced green sputum and caused chest soreness. Associated with wheezing and gag-emesis, sore throat, LEFT ear pain, chills without fever, achy muscles. CXR was normal. Treated with doxycycline and tessalon for sinobronchitis.  She stopped the doxycycline after 3-4 days, thinking that wheezing and hoarseness may have been caused by it.  Returned 02/13/2016: She has continued to have coughing, though not running up a lot of phlegm. She has more problems at nighttime. She does not have any inhalers. She does not smoke or live with any smokers. She does have a history of having had an episode of asthma about 3 years ago we treated her for. She's been feeling hot and cold but no documented fevers.  CBC was normal. Glucose 90. Thought cough induced asthma and prescribe azithromycin, Prednisone taper, albuterol inhaler.  No improvements. Wheezing and SOB and hoarseness seem worse to her. Chills. No fever. No nausea, vomiting or diarrhea. Albuterol inhaler helps temporarily.    Review of Systems As above.    There are no active problems to display for this patient.    Prior to Admission medications   Medication Sig Start Date End Date Taking? Authorizing Provider  albuterol (PROVENTIL HFA;VENTOLIN HFA) 108 (90 Base) MCG/ACT inhaler Inhale 2 puffs into the lungs every 4 (four) hours as needed for wheezing or shortness of breath (cough, shortness of breath or wheezing.). 02/13/16  Yes Posey Boyer, MD  clobetasol cream (TEMOVATE) [id number] %  Apply 1 application topically 2 (two) times daily. Apply a small amount rubbed in twice daily to skin lesion on chest wall 01/24/15  Yes Posey Boyer, MD     No Known Allergies     Objective:  Physical Exam  Constitutional: She is oriented to person, place, and time. She appears well-developed and well-nourished. She is active and cooperative. No distress.  BP 108/78 (BP Location: Right Arm, Patient Position: Sitting, Cuff Size: Large)   Temp 98.2 F (36.8 C) (Oral)   Resp 18   Ht 4\' 11"  (1.499 m)   Wt 229 lb (103.9 kg)   SpO2 98%   BMI 46.25 kg/m   HENT:  Head: Normocephalic and atraumatic.  Right Ear: Hearing, tympanic membrane, external ear and ear canal normal.  Left Ear: Hearing, tympanic membrane, external ear and ear canal normal.  Nose: Nose normal.  Mouth/Throat: Uvula is midline, oropharynx is clear and moist and mucous membranes are normal. She has dentures.  Eyes: Conjunctivae are normal. No scleral icterus.  Neck: Normal range of motion and full passive range of motion without pain. Neck supple. No thyromegaly present.  Voice is hoarse  Cardiovascular: Normal rate, regular rhythm and normal heart sounds.   Pulses:      Radial pulses are 2+ on the right side, and 2+ on the left side.  Pulmonary/Chest: Effort normal. She has decreased breath sounds. She has no wheezes. She has no rhonchi. She has no rales.  Albuterol + Atrovent neb in the office resulted in subjective improvement and normal  lung exam on recheck.  Lymphadenopathy:       Head (right side): No tonsillar, no preauricular, no posterior auricular and no occipital adenopathy present.       Head (left side): No tonsillar, no preauricular, no posterior auricular and no occipital adenopathy present.    She has no cervical adenopathy.       Right: No supraclavicular adenopathy present.       Left: No supraclavicular adenopathy present.  Neurological: She is alert and oriented to person, place, and time. No  sensory deficit.  Skin: Skin is warm, dry and intact. No rash noted. No cyanosis or erythema. Nails show no clubbing.  Psychiatric: She has a normal mood and affect. Her speech is normal and behavior is normal.           Assessment & Plan:   1. Cough Not improved following partial course of doxycycline and then azithromycin. Prednisone helped some initially. Tessalon without benefit. Albuterol inhaler helps temporarily. Neb helped considerably in the office. Continue Q4hours albuterol inhaler. Add Advair BID. Hycodan syrup. Rest. Hydrate. Re-evaluate with me on 02/25/2015, sooner if worsens. - albuterol (PROVENTIL) (2.5 MG/3ML) 0.083% nebulizer solution 2.5 mg; Take 3 mLs (2.5 mg total) by nebulization once. - ipratropium (ATROVENT) nebulizer solution 0.5 mg; Take 2.5 mLs (0.5 mg total) by nebulization once. - Fluticasone-Salmeterol (ADVAIR) 250-50 MCG/DOSE AEPB; Inhale 1 puff into the lungs 2 (two) times daily. Rinse mouth or brush teeth after each dose  Dispense: 60 each; Refill: 0 - HYDROcodone-homatropine (HYCODAN) 5-1.5 MG/5ML syrup; Take 5 mLs by mouth every 12 (twelve) hours as needed for cough.  Dispense: 100 mL; Refill: 0  2. Laryngitis Due to #1. If persists, consider ENT vs pulmonology evaluation.   Fara Chute, PA-C Physician Assistant-Certified [facility]

## 2016-02-25 ENCOUNTER — Encounter: Payer: Self-pay | Admitting: Physician Assistant

## 2016-02-25 ENCOUNTER — Ambulatory Visit (INDEPENDENT_AMBULATORY_CARE_PROVIDER_SITE_OTHER): Payer: PPO | Admitting: Physician Assistant

## 2016-02-25 VITALS — BP 118/86 | HR 96 | Temp 99.0°F | Resp 16 | Ht 59.25 in | Wt 223.8 lb

## 2016-02-25 DIAGNOSIS — R0602 Shortness of breath: Secondary | ICD-10-CM | POA: Diagnosis not present

## 2016-02-25 DIAGNOSIS — R05 Cough: Secondary | ICD-10-CM | POA: Diagnosis not present

## 2016-02-25 DIAGNOSIS — J040 Acute laryngitis: Secondary | ICD-10-CM | POA: Diagnosis not present

## 2016-02-25 DIAGNOSIS — R059 Cough, unspecified: Secondary | ICD-10-CM

## 2016-02-25 NOTE — Progress Notes (Signed)
Patient ID: Gina Guerrero, female    DOB: 03/02/1940, 76 y.o.   MRN: [id number]  PCP: No PCP Per Patient  Chief Complaint  Patient presents with  . Follow-up    "breathing is better", little wheezing but not like it was    Subjective:   Presents for evaluation of cough and wheezing.  Presented 02/03/2016 for evaluation of cough and wheezing x 1 week. Cough produced green sputum and caused chest soreness. Associated with wheezing and gag-emesis, sore throat, LEFT ear pain, chills without fever, achy muscles. CXR was normal. Treated with doxycycline and tessalon for sinobronchitis.  She stopped the doxycycline after 3-4 days, thinking that wheezing and hoarseness may have been caused by it.  Returned 02/13/2016: She has continued to have coughing, though not running up a lot of phlegm. She has more problems at nighttime. She does not have any inhalers. She does not smoke or live with any smokers. She does have a history of having had an episode of asthma about 3 years ago we treated her for. She's been feeling hot and cold but no documented fevers.  CBC was normal. Glucose 90. Thought cough induced asthma and prescribe azithromycin, Prednisone taper, albuterol inhaler.  Returned on 02/21/2016: Not improvemed. Wheezing and SOB and hoarseness seemed worse to her. Chills. No fever. No nausea, vomiting or diarrhea. Albuterol inhaler helping temporarily. "Can't catch my breath." Hoarse. Occasional RIGHT eat pain. cough is non-productive. "It's so tight."  Albuterol+ Atrovent neb in the office improved her symptoms and exam normalized. She was started on Advair and advised to return today for reassessment, with plans to refer for specialty evaluation, depending on the persistent symptoms.  She reports that she feels much better. Minimal cough. Voice has returned to normal.   Review of Systems No SOB. Minimal cough. No sore throat, drainage, ear pressure or pain. No  laryngitis.    There are no active problems to display for this patient.    Prior to Admission medications   Medication Sig Start Date End Date Taking? Authorizing Provider  albuterol (PROVENTIL HFA;VENTOLIN HFA) 108 (90 Base) MCG/ACT inhaler Inhale 2 puffs into the lungs every 4 (four) hours as needed for wheezing or shortness of breath (cough, shortness of breath or wheezing.). 02/13/16  Yes Posey Boyer, MD  clobetasol cream (TEMOVATE) [id number] % Apply 1 application topically 2 (two) times daily. Apply a small amount rubbed in twice daily to skin lesion on chest wall 01/24/15  Yes Posey Boyer, MD  Fluticasone-Salmeterol (ADVAIR) 250-50 MCG/DOSE AEPB Inhale 1 puff into the lungs 2 (two) times daily. Rinse mouth or brush teeth after each dose 02/21/16  Yes Giana Castner, PA-C  HYDROcodone-homatropine (HYCODAN) 5-1.5 MG/5ML syrup Take 5 mLs by mouth every 12 (twelve) hours as needed for cough. 02/21/16  Yes Madailein Londo, PA-C     No Known Allergies     Objective:  Physical Exam  Constitutional: She is oriented to person, place, and time. She appears well-developed and well-nourished. She is active and cooperative. No distress.  BP 118/86   Pulse 96   Temp 99 F (37.2 C) (Oral)   Resp 16   Ht 4' 11.25" (1.505 m)   Wt 223 lb 12.8 oz (101.5 kg)   SpO2 96%   BMI 44.82 kg/m   HENT:  Head: Normocephalic and atraumatic.  Right Ear: Hearing normal.  Left Ear: Hearing normal.  Eyes: Conjunctivae are normal. No scleral icterus.  Neck: Normal range of motion.  Neck supple. No thyromegaly present.  Cardiovascular: Normal rate, regular rhythm and normal heart sounds.   Pulses:      Radial pulses are 2+ on the right side, and 2+ on the left side.  Pulmonary/Chest: Effort normal and breath sounds normal.  Lymphadenopathy:       Head (right side): No tonsillar, no preauricular, no posterior auricular and no occipital adenopathy present.       Head (left side): No tonsillar, no  preauricular, no posterior auricular and no occipital adenopathy present.    She has no cervical adenopathy.       Right: No supraclavicular adenopathy present.       Left: No supraclavicular adenopathy present.  Neurological: She is alert and oriented to person, place, and time. No sensory deficit.  Skin: Skin is warm, dry and intact. No rash noted. No cyanosis or erythema. Nails show no clubbing.  Psychiatric: She has a normal mood and affect. Her speech is normal and behavior is normal.           Assessment & Plan:   1. Shortness of breath Resolved.  2. Cough Significantly improved. Nearly resolved.  3. Laryngitis Resolved.  She was advised to continue the Advair until she only rarely needed the Albuterol rescue inhaler, at which time she can stop the Advair. If she continues to need it, RTC.  Fara Chute, PA-C Physician Assistant-Certified [facility]

## 2016-02-25 NOTE — Patient Instructions (Addendum)
Continue the new inhaler (Advair) until you only rarely need the rescue (Albuterol) inhaler, the stop it.  If your symptoms recur, resume the Advair and let me know.    IF you received an x-ray today, you will receive an invoice from [facility]. Please contact [facility] at [phone] with questions or concerns regarding your invoice.   IF you received labwork today, you will receive an invoice from [facility]. Please contact [facility] at [phone] with questions or concerns regarding your invoice.   Our billing staff will not be able to assist you with questions regarding bills from these companies.  You will be contacted with the lab results as soon as they are available. The fastest way to get your results is to activate your My Chart account. Instructions are located on the last page of this paperwork. If you have not heard from [place] regarding the results in 2 weeks, please contact this office.

## 2016-03-26 ENCOUNTER — Ambulatory Visit (INDEPENDENT_AMBULATORY_CARE_PROVIDER_SITE_OTHER): Payer: PPO

## 2016-03-26 ENCOUNTER — Encounter: Payer: Self-pay | Admitting: Family Medicine

## 2016-03-26 ENCOUNTER — Ambulatory Visit (INDEPENDENT_AMBULATORY_CARE_PROVIDER_SITE_OTHER): Payer: PPO | Admitting: Family Medicine

## 2016-03-26 VITALS — BP 148/82 | HR 96 | Temp 98.0°F | Resp 16 | Ht 59.25 in | Wt 225.0 lb

## 2016-03-26 DIAGNOSIS — Z23 Encounter for immunization: Secondary | ICD-10-CM

## 2016-03-26 DIAGNOSIS — R05 Cough: Secondary | ICD-10-CM

## 2016-03-26 DIAGNOSIS — R490 Dysphonia: Secondary | ICD-10-CM

## 2016-03-26 DIAGNOSIS — R61 Generalized hyperhidrosis: Secondary | ICD-10-CM

## 2016-03-26 DIAGNOSIS — R059 Cough, unspecified: Secondary | ICD-10-CM

## 2016-03-26 MED ORDER — FLUTICASONE PROPIONATE 50 MCG/ACT NA SUSP: 2.0000 | Freq: Every day | NASAL | 6 refills | Status: DC

## 2016-03-26 NOTE — Patient Instructions (Addendum)
It was very good to meet you today.  Your chest xray looks very good.  There's no sign of infection, bronchitis, or pneumonia.   Start the [facility].  This is a steroid that will stop any drainage you have from your sinuses.    I am referring you to an ear, nose, and throat doctor for the prolonged hoarseness.  We are checking labs today which would let us know about any infection or other issues.      IF you received an x-ray today, you will receive an invoice from [facility]. Please contact [facility] at [phone] with questions or concerns regarding your invoice.   IF you received labwork today, you will receive an invoice from [place]. Please contact [facility] at [phone] with questions or concerns regarding your invoice.   Our billing staff will not be able to assist you with questions regarding bills from these companies.  You will be contacted with the lab results as soon as they are available. The fastest way to get your results is to activate your My Chart account. Instructions are located on the last page of this paperwork. If you have not heard from [place] regarding the results in 2 weeks, please contact this office.

## 2016-03-26 NOTE — Progress Notes (Addendum)
Gina Guerrero is a 76 y.o. female who presents to [place] at [facility] today for hoarseness:  1.  Hoarseness:  Present since December 11.  Patient has had multiple visits here secondary to ongoing issues from URI symptoms. Initially evaluated back on December 11 for cough. She returned on December 21 for the same. This followed by December 29 and then January 2.  She had cough and wheezing throughout entire time. She was diagnosed as asthmatic and started on albuterol and Advair inhalers. She has been treated with steroid taper as well as antibiotics.  This resulted in resolution of her shortness of breath and audible wheezing symptoms.    Her cough has since resolved, even though she's not using any of her inhalers. Of note, she has not actually taken her Advair or albuterol inhaler for the past 2 weeks because she was concerned for development of thrush and this might complicate her presentation of hoarseness.  She has had no fevers but has had some subjective chills. She's also had night sweats for the past 4-5 days. She was not having any episodes of night sweats previously. She's lost 2 pounds since January 2. She is eating and drinking well. She has no nausea vomiting. No shortness of breath on exertion. No palpitations. No lower extremity edema.  ROS as above.     PMH reviewed. Patient is a nonsmoker.   Past Medical History:  Diagnosis Date  . Cataract    Past Surgical History:  Procedure Laterality Date  . ABDOMINAL HYSTERECTOMY    . EYE SURGERY      Medications reviewed. Current Outpatient Prescriptions  Medication Sig Dispense Refill  . albuterol (PROVENTIL HFA;VENTOLIN HFA) 108 (90 Base) MCG/ACT inhaler Inhale 2 puffs into the lungs every 4 (four) hours as needed for wheezing or shortness of breath (cough, shortness of breath or wheezing.). 1 Inhaler 1  . clobetasol cream (TEMOVATE) [id number] % Apply 1 application topically 2 (two) times daily. Apply a small amount rubbed in twice  daily to skin lesion on chest wall 15 g 1  . Fluticasone-Salmeterol (ADVAIR) 250-50 MCG/DOSE AEPB Inhale 1 puff into the lungs 2 (two) times daily. Rinse mouth or brush teeth after each dose 60 each 0  . HYDROcodone-homatropine (HYCODAN) 5-1.5 MG/5ML syrup Take 5 mLs by mouth every 12 (twelve) hours as needed for cough. (Patient not taking: Reported on 03/26/2016) 100 mL 0   No current facility-administered medications for this visit.      Physical Exam:  BP (!) 148/82   Pulse 96   Temp 98 F (36.7 C) (Oral)   Resp 16   Ht 4' 11.25" (1.505 m)   Wt 225 lb (102.1 kg)   SpO2 99%   BMI 45.06 kg/m  Gen:  Patient sitting on exam table, appears stated age in no acute distress Head: Normocephalic atraumatic Eyes: EOMI, PERRL, sclera and conjunctiva non-erythematous Ears:  Canals clear bilaterally.  TMs pearly gray bilaterally without erythema or bulging.   Nose:  Nasal turbinates grossly enlarged bilaterally. Some exudates noted. Tender to palpation of maxillary sinus  Mouth: Mucosa membranes moist. Tonsils +2, nonenlarged, non-erythematous. Neck: No cervical lymphadenopathy noted Heart:  RRR, no murmurs auscultated. Pulm:  Clear to auscultation bilaterally with good air movement.  No wheezes or rales noted.   Ext:  No LE edema   Assessment and Plan:  1.  Hoarseness: - Ongoing issue for patient. It sounds like her asthma, this is much actually has, is under control. She  is not actually used any inhaler for the past couple weeks. There is no evidence of thrush on examination today. -No issues with lungs on exam today or by history. -As hoarseness is present about 6 weeks we'll try multitude of things: We'll start her on Flonase to see if this resolves any postnasal drip which may relate hoarseness. -Also because this is ongoing despite factor other URI and lower respiratory tract issues have resolved, sent to refer to otolaryngology for further evaluation and possible laryngoscopy. - If  completely esolves with flonase, will presume from PND, and she can cancel ENT referral.   #2. Night sweats: -Unclear etiology. -Unclear if this is related to versus above. -Checking CBC for a leukocytosis. Also check a TSH for hyperthyroidism and HIV antibody. Non-she has no adenopathy on examination today. -If persists will need further workup, that she has no other ongoing B symptoms. She has had no real weight loss and her cough is now resolved.

## 2016-03-27 LAB — COMPREHENSIVE METABOLIC PANEL
ALT: 13 IU/L (ref 0–32)
AST: 20 IU/L (ref 0–40)
Albumin/Globulin Ratio: 1.3 (ref 1.2–2.2)
Albumin: 4 g/dL (ref 3.5–4.8)
Alkaline Phosphatase: 87 IU/L (ref 39–117)
BUN/Creatinine Ratio: 24 (ref 12–28)
BUN: 19 mg/dL (ref 8–27)
Bilirubin Total: 0.4 mg/dL (ref 0.0–1.2)
CO2: 24 mmol/L (ref 18–29)
Calcium: 9.1 mg/dL (ref 8.7–10.3)
Chloride: 101 mmol/L (ref 96–106)
Creatinine, Ser: 0.78 mg/dL (ref 0.57–1.00)
GFR calc Af Amer: 86 mL/min/{1.73_m2} (ref >59)
GFR calc non Af Amer: 75 mL/min/{1.73_m2} (ref >59)
Globulin, Total: 3 g/dL (ref 1.5–4.5)
Glucose: 102 mg/dL — ABNORMAL HIGH (ref 65–99)
Potassium: 3.8 mmol/L (ref 3.5–5.2)
Sodium: 141 mmol/L (ref 134–144)
Total Protein: 7 g/dL (ref 6.0–8.5)

## 2016-03-27 LAB — CBC
Hematocrit: 42.1 % (ref 34.0–46.6)
Hemoglobin: 14.2 g/dL (ref 11.1–15.9)
MCH: 29.1 pg (ref 26.6–33.0)
MCHC: 33.7 g/dL (ref 31.5–35.7)
MCV: 86 fL (ref 79–97)
Platelets: 237 10*3/uL (ref 150–379)
RBC: 4.88 x10E6/uL (ref 3.77–5.28)
RDW: 14.8 % (ref 12.3–15.4)
WBC: 7.9 10*3/uL (ref 3.4–10.8)

## 2016-03-27 LAB — TSH: TSH: 1.85 u[IU]/mL (ref 0.450–4.500)

## 2016-03-27 LAB — HIV ANTIBODY (ROUTINE TESTING W REFLEX): HIV Screen 4th Generation wRfx: NONREACTIVE

## 2016-04-01 ENCOUNTER — Telehealth: Payer: Self-pay

## 2016-04-01 DIAGNOSIS — R490 Dysphonia: Secondary | ICD-10-CM | POA: Diagnosis not present

## 2016-04-01 DIAGNOSIS — J040 Acute laryngitis: Secondary | ICD-10-CM | POA: Diagnosis not present

## 2016-04-01 DIAGNOSIS — R05 Cough: Secondary | ICD-10-CM | POA: Diagnosis not present

## 2016-04-01 NOTE — Telephone Encounter (Signed)
Pt is calling to have someone go over her lab results with her.  She has called twice today and the lab voicemail box is currently full.  Please advise  [phone]

## 2016-04-02 NOTE — Telephone Encounter (Signed)
L/m Advised of her negative lab results

## 2016-04-17 ENCOUNTER — Telehealth: Payer: Self-pay | Admitting: *Deleted

## 2016-04-17 NOTE — Telephone Encounter (Signed)
"  I had canceled my surgery back in November.  I would like to reschedule it now."  Do you have a date in mind, he does surgery on Thursdays.  "I'd like to do it soon as possible because this toe is killing me."  He can do it on March 8.  "That date will be fine.  Where is the surgical center located?"  It is located at [place]. [employer].  The address is on the brochure that was given to you in your bag.  Do you still have it?  "Yes, I still have it.  Thank you."

## 2016-04-21 ENCOUNTER — Telehealth: Payer: Self-pay | Admitting: Podiatry

## 2016-04-21 NOTE — Telephone Encounter (Signed)
Tried calling patient to ask Gina Guerrero to come into the office one day this week to have x-rays taken prior to Gina Guerrero surgery scheduled for Thursday 08 March with Dr. Amalia Hailey. The house phone just rang with no answer/answering machine.

## 2016-04-23 ENCOUNTER — Ambulatory Visit (INDEPENDENT_AMBULATORY_CARE_PROVIDER_SITE_OTHER): Payer: PPO

## 2016-04-23 ENCOUNTER — Ambulatory Visit (INDEPENDENT_AMBULATORY_CARE_PROVIDER_SITE_OTHER): Payer: PPO | Admitting: Podiatry

## 2016-04-23 DIAGNOSIS — M205X9 Other deformities of toe(s) (acquired), unspecified foot: Secondary | ICD-10-CM

## 2016-04-24 NOTE — Progress Notes (Signed)
Subjective:     Patient ID: Gina Guerrero, female   DOB: 02/22/1941, 76 y.o.   MRN: [id number]  HPI patient presents to get x-rays for surgery with Dr. Amalia Hailey   Review of Systems     Objective:   Physical Exam Chronic hammertoe deformity with pain    Assessment:     Hammertoe deformity bilateral    Plan:     X-rays taken reviewed and will be discussed with Dr. Amalia Hailey for surgery scheduled in March

## 2016-04-30 ENCOUNTER — Encounter: Payer: Self-pay | Admitting: Podiatry

## 2016-04-30 DIAGNOSIS — M25774 Osteophyte, right foot: Secondary | ICD-10-CM

## 2016-04-30 DIAGNOSIS — L600 Ingrowing nail: Secondary | ICD-10-CM | POA: Diagnosis not present

## 2016-04-30 DIAGNOSIS — Z01818 Encounter for other preprocedural examination: Secondary | ICD-10-CM | POA: Diagnosis not present

## 2016-04-30 DIAGNOSIS — L609 Nail disorder, unspecified: Secondary | ICD-10-CM

## 2016-04-30 DIAGNOSIS — M2041 Other hammer toe(s) (acquired), right foot: Secondary | ICD-10-CM | POA: Diagnosis not present

## 2016-04-30 DIAGNOSIS — M2042 Other hammer toe(s) (acquired), left foot: Secondary | ICD-10-CM | POA: Diagnosis not present

## 2016-04-30 DIAGNOSIS — M2021 Hallux rigidus, right foot: Secondary | ICD-10-CM | POA: Diagnosis not present

## 2016-05-06 ENCOUNTER — Ambulatory Visit (INDEPENDENT_AMBULATORY_CARE_PROVIDER_SITE_OTHER): Payer: Self-pay | Admitting: Podiatry

## 2016-05-06 ENCOUNTER — Ambulatory Visit (INDEPENDENT_AMBULATORY_CARE_PROVIDER_SITE_OTHER): Payer: PPO

## 2016-05-06 DIAGNOSIS — M205X9 Other deformities of toe(s) (acquired), unspecified foot: Secondary | ICD-10-CM | POA: Diagnosis not present

## 2016-05-06 DIAGNOSIS — Z9889 Other specified postprocedural states: Secondary | ICD-10-CM

## 2016-05-13 ENCOUNTER — Encounter: Payer: Self-pay | Admitting: Allergy & Immunology

## 2016-05-13 ENCOUNTER — Ambulatory Visit (INDEPENDENT_AMBULATORY_CARE_PROVIDER_SITE_OTHER): Payer: PPO | Admitting: Allergy & Immunology

## 2016-05-13 ENCOUNTER — Ambulatory Visit (INDEPENDENT_AMBULATORY_CARE_PROVIDER_SITE_OTHER): Payer: Self-pay | Admitting: Podiatry

## 2016-05-13 VITALS — BP 134/76 | HR 87 | Temp 98.0°F | Resp 16 | Ht 58.5 in | Wt 225.0 lb

## 2016-05-13 DIAGNOSIS — J3089 Other allergic rhinitis: Secondary | ICD-10-CM | POA: Diagnosis not present

## 2016-05-13 DIAGNOSIS — R490 Dysphonia: Secondary | ICD-10-CM | POA: Diagnosis not present

## 2016-05-13 DIAGNOSIS — B370 Candidal stomatitis: Secondary | ICD-10-CM | POA: Diagnosis not present

## 2016-05-13 DIAGNOSIS — R053 Chronic cough: Secondary | ICD-10-CM

## 2016-05-13 DIAGNOSIS — J310 Chronic rhinitis: Secondary | ICD-10-CM | POA: Diagnosis not present

## 2016-05-13 DIAGNOSIS — Z9889 Other specified postprocedural states: Secondary | ICD-10-CM

## 2016-05-13 DIAGNOSIS — K219 Gastro-esophageal reflux disease without esophagitis: Secondary | ICD-10-CM

## 2016-05-13 DIAGNOSIS — R05 Cough: Secondary | ICD-10-CM

## 2016-05-13 MED ORDER — FLUCONAZOLE 200 MG PO TABS: 200.0000 mg | ORAL_TABLET | Freq: Every day | ORAL | 0 refills | Status: AC

## 2016-05-13 MED ORDER — OMEPRAZOLE 40 MG PO CPDR: 40.0000 mg | DELAYED_RELEASE_CAPSULE | Freq: Every day | ORAL | 1 refills | Status: DC

## 2016-05-13 NOTE — Progress Notes (Addendum)
NEW PATIENT  Date of Service/Encounter:  05/13/16  Referring provider: Annabell Sabal, MD   Assessment:   Gastroesophageal reflux disease  Chronic cough  Hoarse voice  Oral thrush   Plan/Recommendations:   1. Chronic cough - secondary to GERD versus oral Candidiasis - We will get blood work to look for allergies. - We will send in fluconazole 200mg  once daily for 14 days for the thrush. - We will start omeprazole 40mg  daily empirically to see if this helps the hoarse voice. - Could consider workup for causes of recurrent laryngeal nerve dysfunction versus a mass.  - We will get blood work to look for environmental allergies, although she has no nasal congestion or rhinorrhea suggestive of such.  2. Return in about 4 weeks (around 06/10/2016).   Subjective:   Gina Guerrero is a 76 y.o. female presenting today for evaluation of  Chief Complaint  Patient presents with  . Allergies  . Asthma  . Wheezing    mostly at night  . Cough    off and on     Gina HEFFERAN has a history of the following: Patient Active Problem List   Diagnosis Date Noted  . Chronic cough 05/13/2016    History obtained from: chart review and patient.  Gina Guerrero was referred by [facility], MD.     Gina Guerrero is a 76 y.o. female presenting for evaluation of asthma and allergies. Symptoms started in December 2017 when she had a cold. She was seen in Urgent Care December th and has been sick since that time. Prior to this, she was in perfect health. She has had no fevers. Pulse ox has always been normal. CXR has been normal. She did have blood work in February 2018 that was normal. She was placed on Tessalon pearls, doxycycline, and Tussionex. She was also started on Advair which seemed to make her hoarse. She took the doxycycline for three days and had hoarseness and shortness of breath. She was started on the Advair Diskus after this first urgent care visit in December, but only took it for a few days  since there was no improvement. She has been off of all of these medications for one month and continues to have these wheezing and shortness of breath. She has also been quite wary of continuing on these medications since she read the side effect profiles.   She did have allergy testing performed way back when but does not remember what she was allergic to. She did see Dr. Ernesto Rutherford three weeks ago. He did perform a nasal endoscopy which was unrevealing. He recommended seeing an allergist. She is on Flonase but has not taken it in quite some time; she was prescribed this in February but only use it for three days without improvement. Dr. Mingo Amber prescribed this medication. She denies postnasal drip although she does endorse some "collection of stuff" in her throat. She denies symptoms of heart burn and has never needed a reflux medication.   Of note, she did have some exposure to a 76 year old man who passed away around Thanksgiving. He did not have any known infections. There is a moist basement with some mildew present but this has been ongoing for quite some time, upwards of one year.    Otherwise, there is no history of other atopic diseases, including asthma, drug allergies, food allergies, stinging insect allergies, or urticaria. There is no significant infectious history. Vaccinations are up to date.    Past Medical History:  Patient Active Problem List   Diagnosis Date Noted  . Chronic cough 05/13/2016    Medication List:  Allergies as of 05/13/2016   No Known Allergies     Medication List       Accurate as of 05/13/16  3:59 PM. Always use your most recent med list.          albuterol 108 (90 Base) MCG/ACT inhaler Commonly known as:  PROVENTIL HFA;VENTOLIN HFA Inhale 2 puffs into the lungs every 4 (four) hours as needed for wheezing or shortness of breath (cough, shortness of breath or wheezing.).   clobetasol cream 0.05 % Commonly known as:  TEMOVATE Apply 1 application  topically 2 (two) times daily. Apply a small amount rubbed in twice daily to skin lesion on chest wall   fluconazole 200 MG tablet Commonly known as:  DIFLUCAN Take 1 tablet (200 mg total) by mouth daily.   fluticasone 50 MCG/ACT nasal spray Commonly known as:  FLONASE Place 2 sprays into both nostrils daily.   Fluticasone-Salmeterol 250-50 MCG/DOSE Aepb Commonly known as:  ADVAIR Inhale 1 puff into the lungs 2 (two) times daily. Rinse mouth or brush teeth after each dose   meloxicam 15 MG tablet Commonly known as:  MOBIC   omeprazole 40 MG capsule Commonly known as:  PRILOSEC Take 1 capsule (40 mg total) by mouth daily.   UNABLE TO FIND Take 1 tablet by mouth daily. preser vision eye vitamin       Birth History: non-contributory.   Developmental History: non-contributory.   Past Surgical History: Past Surgical History:  Procedure Laterality Date  . ABDOMINAL HYSTERECTOMY    . EYE SURGERY       Family History: Family History  Problem Relation Age of Onset  . Allergic rhinitis Neg Hx   . Angioedema Neg Hx   . Asthma Neg Hx   . Eczema Neg Hx   . Immunodeficiency Neg Hx   . Urticaria Neg Hx      Social History: [name] lives at home with her husband of 29 years. Their house was built in [place]. There are wood floors throughout. There is some mold damage in the basement. They have gas heating and central cooling. There is one dog inside the home. She does have dust mite covers on her bed, but not her pillows. There is no tobacco exposure.     Review of Systems: a 14-point review of systems is pertinent for what is mentioned in HPI.  Otherwise, all other systems were negative. Constitutional: negative other than that listed in the HPI Eyes: negative other than that listed in the HPI Ears, nose, mouth, throat, and face: negative other than that listed in the HPI Respiratory: negative other than that listed in the HPI Cardiovascular: negative other than that listed in  the HPI Gastrointestinal: negative other than that listed in the HPI Genitourinary: negative other than that listed in the HPI Integument: negative other than that listed in the HPI Hematologic: negative other than that listed in the HPI Musculoskeletal: negative other than that listed in the HPI Neurological: negative other than that listed in the HPI Allergy/Immunologic: negative other than that listed in the HPI    Objective:   Blood pressure 134/76, pulse 87, temperature 98 F (36.7 C), temperature source Oral, resp. rate 16, height 4' 10.5" (1.486 m), weight 225 lb (102.1 kg), SpO2 97 %. Body mass index is 46.22 kg/m.   Physical Exam:  General: Alert, interactive, in no acute distress. Pleasant  elderly female. Very talkative with joyful smile.  Eyes: No conjunctival injection present on the right, No conjunctival injection present on the left, PERRL bilaterally, No discharge on the right, No discharge on the left and No Horner-Trantas dots present Ears: Right TM pearly gray with normal light reflex, Left TM pearly gray with normal light reflex, Right TM intact without perforation and Left TM intact without perforation.  Nose/Throat: External nose within normal limits and septum midline, turbinates minimally edematous without discharge, post-pharynx mildly erythematous without cobblestoning in the posterior oropharynx. Tonsils 2+ without exudates Neck: Supple without thyromegaly.  Adenopathy: no enlarged lymph nodes appreciated in the anterior cervical, occipital, axillary, epitrochlear, inguinal, or popliteal regions Lungs: Clear to auscultation without wheezing, rhonchi or rales. No increased work of breathing. CV: Normal S1/S2, no murmurs. Capillary refill <2 seconds.  Abdomen: Nondistended, nontender. No guarding or rebound tenderness. Bowel sounds present in all fields and hyperactive  Skin: Warm and dry, without lesions or rashes. Extremities:  No clubbing, cyanosis or  edema. Neuro:   Grossly intact. No focal deficits appreciated. Responsive to questions.  Diagnostic studies:  Spirometry: results normal (FEV1: 1.74/119%, FVC: 1.91/102%, FEV1/FVC: 91%).    Spirometry consistent with normal pattern.   Allergy Studies: None     Salvatore Marvel, MD [facility]

## 2016-05-13 NOTE — Patient Instructions (Addendum)
1. Chronic cough - GERD versus Candidiasis - We will get blood work to look for allergies.  2. Hoarse voice - We will send in fluconazole 200mg  once daily for 14 days for the thrush. - Start omeprazole 40mg  daily.  3. Return in about 4 weeks (around 06/10/2016).  Please inform us of any Emergency Department visits, hospitalizations, or changes in symptoms. Call us before going to the ED for breathing or allergy symptoms since we might be able to fit you in for a sick visit. Feel free to contact us anytime with any questions, problems, or concerns.  It was a pleasure to meet you today! Happy spring!   Websites that have reliable patient information: 1. American Academy of Asthma, Allergy, and Immunology: www.aaaai.org 2. Food Allergy Research and Education (FARE): foodallergy.org 3. Mothers of Asthmatics: http://www.asthmacommunitynetwork.org 4. American College of Allergy, Asthma, and Immunology: www.acaai.org

## 2016-05-14 LAB — CP584 ZONE 3
Allergen, A. alternata, m6: <0.1 kU/L
Allergen, Black Locust, Acacia9: <0.1 kU/L
Allergen, C. Herbarum, M2: <0.1 kU/L
Allergen, Cedar tree, t12: <0.1 kU/L
Allergen, Comm Silver Birch, t9: <0.1 kU/L
Allergen, D pternoyssinus,d7: 1.79 kU/L — ABNORMAL HIGH
Allergen, Mucor Racemosus, M4: <0.1 kU/L
Allergen, Mulberry, t76: <0.1 kU/L
Allergen, Oak,t7: <0.1 kU/L
Allergen, P. notatum, m1: <0.1 kU/L
Allergen, S. Botryosum, m10: <0.1 kU/L
Aspergillus fumigatus, m3: <0.1 kU/L
Bahia Grass: <0.1 kU/L
Bermuda Grass: <0.1 kU/L
Box Elder IgE: <0.1 kU/L
Cat Dander: 0.14 kU/L — ABNORMAL HIGH
Cockroach: <0.1 kU/L
Common Ragweed: <0.1 kU/L
D. farinae: 3.33 kU/L — ABNORMAL HIGH
Dog Dander: <0.1 kU/L
Elm IgE: <0.1 kU/L
Johnson Grass: <0.1 kU/L
Meadow Grass: <0.1 kU/L
Nettle: <0.1 kU/L
Pecan/Hickory Tree IgE: <0.1 kU/L
Plantain: <0.1 kU/L
Rough Pigweed  IgE: <0.1 kU/L

## 2016-05-17 NOTE — Progress Notes (Signed)
Subjective: Patient presents today status post bilateral fifth toe arthroplasty with a when a graft procedure to the left great toe. Patient states that she's feeling much better. Patient denies significant amount of pain.  Date of surgery 04/30/2016.  Objective: Skin incisions well coapted with sutures intact. No sign of infectious process. Minimal edema noted.  Assessment: Status post bilateral fifth toe arthroplasty.  Procedure left great toe. Date of surgery 04/30/2016.  Plan of care: Today dressings were changed. Return to clinic in 1 week for stitches removal  Edrick Kins, DPM [facility]  Dr. Edrick Kins, [address]                                        [place], [place] [address]                Office [phone]  Fax [phone]

## 2016-05-20 ENCOUNTER — Ambulatory Visit: Payer: PPO | Admitting: Podiatry

## 2016-05-20 ENCOUNTER — Encounter: Payer: PPO | Admitting: Podiatry

## 2016-05-20 NOTE — Progress Notes (Signed)
Subjective: Patient presents today status post bilateral fifth toe arthroplasty with a when a graft procedure to the left great toe. Patient states that she's feeling much better. Patient denies significant amount of pain.  Date of surgery 04/30/2016.  Objective: Skin incisions well coapted with sutures intact. No sign of infectious process. Minimal edema noted.  Assessment: Status post bilateral fifth toe arthroplasty. [place] Procedure left great toe. Date of surgery 04/30/2016.  Plan of care: Today dressings were changed. Today sutures were removed. Patient can begin transitioning into tennis shoes. Return to clinic in 4 weeks   Edrick Kins, DPM [facility]  Dr. Edrick Kins, [address]                                        [place], [place] [address]                Office [phone]  Fax [phone]

## 2016-06-10 ENCOUNTER — Ambulatory Visit: Payer: PPO | Admitting: Allergy & Immunology

## 2016-06-10 ENCOUNTER — Encounter: Payer: Self-pay | Admitting: Allergy & Immunology

## 2016-06-10 ENCOUNTER — Ambulatory Visit (INDEPENDENT_AMBULATORY_CARE_PROVIDER_SITE_OTHER): Payer: PPO | Admitting: Podiatry

## 2016-06-10 ENCOUNTER — Ambulatory Visit (INDEPENDENT_AMBULATORY_CARE_PROVIDER_SITE_OTHER): Payer: PPO

## 2016-06-10 ENCOUNTER — Ambulatory Visit (INDEPENDENT_AMBULATORY_CARE_PROVIDER_SITE_OTHER): Payer: PPO | Admitting: Allergy & Immunology

## 2016-06-10 VITALS — BP 122/78 | HR 87 | Temp 98.2°F | Resp 20 | Ht <= 58 in | Wt 223.6 lb

## 2016-06-10 DIAGNOSIS — Z9889 Other specified postprocedural states: Secondary | ICD-10-CM

## 2016-06-10 DIAGNOSIS — J4540 Moderate persistent asthma, uncomplicated: Secondary | ICD-10-CM

## 2016-06-10 DIAGNOSIS — R490 Dysphonia: Secondary | ICD-10-CM

## 2016-06-10 DIAGNOSIS — T781XXD Other adverse food reactions, not elsewhere classified, subsequent encounter: Secondary | ICD-10-CM | POA: Diagnosis not present

## 2016-06-10 DIAGNOSIS — K219 Gastro-esophageal reflux disease without esophagitis: Secondary | ICD-10-CM | POA: Diagnosis not present

## 2016-06-10 DIAGNOSIS — M205X9 Other deformities of toe(s) (acquired), unspecified foot: Secondary | ICD-10-CM

## 2016-06-10 MED ORDER — AZITHROMYCIN 250 MG PO TABS: ORAL_TABLET | ORAL | 0 refills | Status: DC

## 2016-06-10 MED ORDER — FLUTICASONE FUROATE-VILANTEROL 200-25 MCG/INH IN AEPB: 1.0000 | INHALATION_SPRAY | Freq: Every day | RESPIRATORY_TRACT | 0 refills | Status: AC
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
## 2016-06-10 NOTE — Patient Instructions (Addendum)
1. Moderate persistent asthma - with coexisting ? bronchitis - Lung function looked normal today. - We will change you to Breo 200/25 one inhalation once daily (this will replace the Advair). - Start azithromycin: 500mg  today and then 250mg  daily for four more days - Start the prednisone pack provided. - Daily controller medication(s): Breo 200/25 one inhalation once daily - Rescue medications: ProAir 4 puffs every 4-6 hours as needed - Asthma control goals:  * Full participation in all desired activities (may need albuterol before activity) * Albuterol use two time or less a week on average (not counting use with activity) * Cough interfering with sleep two time or less a month * Oral steroids no more than once a year * No hospitalizations  2. Gastroesophageal reflux disease - We will stop the omeprazole since this does not seem to be helping.  3. Perennial allergic rhinitis (dust mites, cats) - Restart the Flonase two sprays per nostril at night. - We will get an extended mold panel allergy test.   4. Adverse food reaction (shrimp) - We will get blood testing for seafood.  5. Return in about 4 weeks (around 07/08/2016).  Please inform us of any Emergency Department visits, hospitalizations, or changes in symptoms. Call us before going to the ED for breathing or allergy symptoms since we might be able to fit you in for a sick visit. Feel free to contact us anytime with any questions, problems, or concerns.  It was a pleasure to see you again today! Happy spring!   Websites that have reliable patient information: 1. American Academy of Asthma, Allergy, and Immunology: www.aaaai.org 2. Food Allergy Research and Education (FARE): foodallergy.org 3. Mothers of Asthmatics: http://www.asthmacommunitynetwork.org 4. American College of Allergy, Asthma, and Immunology: www.acaai.org  Control of House Dust Mite Allergen    House dust mites play a major role in allergic asthma and  rhinitis.  They occur in environments with high humidity wherever human skin, the food for dust mites is found. High levels have been detected in dust obtained from mattresses, pillows, carpets, upholstered furniture, bed covers, clothes and soft toys.  The principal allergen of the house dust mite is found in its feces.  A gram of dust may contain 1,000 mites and 250,000 fecal particles.  Mite antigen is easily measured in the air during house cleaning activities.    1. Encase mattresses, including the box spring, and pillow, in an air tight cover.  Seal the zipper end of the encased mattresses with wide adhesive tape. 2. Wash the bedding in water of 130 degrees Farenheit weekly.  Avoid cotton comforters/quilts and flannel bedding: the most ideal bed covering is the dacron comforter. 3. Remove all upholstered furniture from the bedroom. 4. Remove carpets, carpet padding, rugs, and non-washable window drapes from the bedroom.  Wash drapes weekly or use plastic window coverings. 5. Remove all non-washable stuffed toys from the bedroom.  Wash stuffed toys weekly. 6. Have the room cleaned frequently with a vacuum cleaner and a damp dust-mop.  The patient should not be in a room which is being cleaned and should wait 1 hour after cleaning before going into the room. 7. Close and seal all heating outlets in the bedroom.  Otherwise, the room will become filled with dust-laden air.  An electric heater can be used to heat the room. 8. Reduce indoor humidity to less than 50%.  Do not use a humidifier.  Control of Dog or Cat Allergen  Avoidance is the best way  to manage a dog or cat allergy. If you have a dog or cat and are allergic to dog or cats, consider removing the dog or cat from the home. If you have a dog or cat but don't want to find it a new home, or if your family wants a pet even though someone in the household is allergic, here are some strategies that may help keep symptoms at bay:  1. Keep the  pet out of your bedroom and restrict it to only a few rooms. Be advised that keeping the dog or cat in only one room will not limit the allergens to that room. 2. Don't pet, hug or kiss the dog or cat; if you do, wash your hands with soap and water. 3. High-efficiency particulate air (HEPA) cleaners run continuously in a bedroom or living room can reduce allergen levels over time. 4. Regular use of a high-efficiency vacuum cleaner or a central vacuum can reduce allergen levels. 5. Giving your dog or cat a bath at least once a week can reduce airborne allergen.

## 2016-06-11 LAB — ALLERGY PANEL 19, SEAFOOD GROUP
Allergen, Salmon, f41: <0.1 kU/L
Crab: <0.1 kU/L
Fish Cod: <0.1 kU/L
Lobster: <0.1 kU/L
Shrimp IgE: <0.1 kU/L
Tuna IgE: <0.1 kU/L

## 2016-06-11 LAB — ALLERGY PANEL 11, MOLD GROUP
Allergen, A. alternata, m6: <0.1 kU/L
Allergen, C. Herbarum, M2: <0.1 kU/L
Allergen, Mucor Racemosus, M4: <0.1 kU/L
Aspergillus fumigatus, m3: <0.1 kU/L
Candida Albicans: 0.37 kU/L — ABNORMAL HIGH

## 2016-06-11 LAB — IGE: IgE (Immunoglobulin E), Serum: 49 kU/L (ref <115)

## 2016-06-11 NOTE — Progress Notes (Signed)
Subjective: Patient presents today status post bilateral fifth toe arthroplasty with a Winograd procedure to the left great toe. Patient states that she is doing well. She reports some intermittent soreness to the toes. Patient denies significant amount of pain.  Date of surgery 04/30/2016.  Objective: Skin incisions well coapted with sutures intact. No sign of infectious process. Minimal edema noted.  Assessment: Status post bilateral fifth toe arthroplasty. Winograd Procedure left great toe. Date of surgery 04/30/2016.  Plan of care: Patient evaluated. X-rays reviewed. Patient doing well. Return to clinic when necessary.  Edrick Kins, DPM [facility]  Dr. Edrick Kins, [place]                                        [place], [place] [address]                Office [phone]  Fax [phone]

## 2016-06-26 ENCOUNTER — Encounter: Payer: Self-pay | Admitting: *Deleted

## 2016-07-08 ENCOUNTER — Ambulatory Visit: Payer: PPO | Admitting: Allergy & Immunology

## 2016-07-15 NOTE — Progress Notes (Signed)
DOS 04/30/2016 Derotational arthroplasty fifth digit bilateral(hammertoe repair)

## 2016-07-27 ENCOUNTER — Ambulatory Visit: Payer: PPO | Admitting: Physician Assistant

## 2016-07-28 ENCOUNTER — Ambulatory Visit (INDEPENDENT_AMBULATORY_CARE_PROVIDER_SITE_OTHER): Payer: PPO

## 2016-07-28 ENCOUNTER — Ambulatory Visit (INDEPENDENT_AMBULATORY_CARE_PROVIDER_SITE_OTHER): Payer: PPO | Admitting: Physician Assistant

## 2016-07-28 ENCOUNTER — Encounter: Payer: Self-pay | Admitting: Physician Assistant

## 2016-07-28 VITALS — BP 109/73 | HR 88 | Temp 98.3°F | Resp 18 | Ht <= 58 in | Wt 217.0 lb

## 2016-07-28 DIAGNOSIS — M25562 Pain in left knee: Secondary | ICD-10-CM

## 2016-07-28 DIAGNOSIS — M25561 Pain in right knee: Secondary | ICD-10-CM

## 2016-07-28 MED ORDER — MELOXICAM 15 MG PO TABS: 15.0000 mg | ORAL_TABLET | Freq: Every day | ORAL | 1 refills | Status: DC

## 2016-07-28 NOTE — Progress Notes (Signed)
Gina Guerrero  MRN: [id number] DOB: 05/13/1940  PCP: Mancel Bale, PA-C  Chief Complaint  Patient presents with  . Knee Injury    both knees that travels to calves x2weeks     Subjective:  Pt presents to clinic for bilateral knee pain that started about 1.5-2 weeks ago. Right side is worse than the left - right started 1st and then left started about 4 days ago.  The pain aches and stabbing with changing position - it is stiff so it is hard to move.  When the right knee started to hurt she started to limp due to pain.  She has had problems with her knees in the past. She has never had imaging.  Recent foot surgery in 3/18 and she stopped her mobic about a month ago because she did not go to the pharmacy to pick up her refill.  No recent long distance travel, nonsmoker.  Helps take care of family members - very active.  [place]ard[place].    Review of Systems  Musculoskeletal: Positive for gait problem.    Patient Active Problem List   Diagnosis Date Noted  . Chronic cough 05/13/2016    Current Outpatient Prescriptions on File Prior to Visit  Medication Sig Dispense Refill  . clobetasol cream (TEMOVATE) 5.62 % Apply 1 application topically 2 (two) times daily. Apply a small amount rubbed in twice daily to skin lesion on chest wall 15 g 1  . UNABLE TO FIND Take 1 tablet by mouth daily. preser vision eye vitamin    . albuterol (PROVENTIL HFA;VENTOLIN HFA) 108 (90 Base) MCG/ACT inhaler Inhale 2 puffs into the lungs every 4 (four) hours as needed for wheezing or shortness of breath (cough, shortness of breath or wheezing.). (Patient not taking: Reported on 06/10/2016) 1 Inhaler 1  . omeprazole (PRILOSEC) 40 MG capsule Take 1 capsule (40 mg total) by mouth daily. 30 capsule 1   No current facility-administered medications on file prior to visit.     No Known Allergies  Pt patients past, family and social history were reviewed and updated.   Objective:  BP 109/73   Pulse 88   Temp 98.3 F  (36.8 C) (Oral)   Resp 18   Ht 4\' 10"  (1.473 m)   Wt 217 lb (98.4 kg)   SpO2 95%   BMI 45.35 kg/m   Physical Exam  Constitutional: She is oriented to person, place, and time and well-developed, well-nourished, and in no distress.  HENT:  Head: Normocephalic and atraumatic.  Right Ear: Hearing and external ear normal.  Left Ear: Hearing and external ear normal.  Eyes: Conjunctivae are normal.  Neck: Normal range of motion.  Pulmonary/Chest: Effort normal.  Musculoskeletal:       Right knee: She exhibits normal range of motion, no effusion, no ecchymosis and no erythema. Tenderness found. Medial joint line tenderness noted. No lateral joint line tenderness noted.       Left knee: She exhibits normal range of motion, no swelling and no erythema. Tenderness found. Medial joint line tenderness noted. No lateral joint line, no MCL and no LCL tenderness noted.  Multiple varicose veins - none specifically tender Mild TTP over the entire bilateral calves and lower thighs  Neurological: She is alert and oriented to person, place, and time. Gait normal.  Skin: Skin is warm and dry.  Psychiatric: Mood, memory, affect and judgment normal.  Vitals reviewed.   Wt Readings from Last 3 Encounters:  07/28/16 217 lb (98.4  kg)  06/10/16 223 lb 9.6 oz (101.4 kg)  05/13/16 225 lb (102.1 kg)   Dg Knee 1-2 Views Left  Result Date: 07/28/2016 CLINICAL DATA:  Acute pain of both knees EXAM: LEFT KNEE - 1-2 VIEW COMPARISON:  None. FINDINGS: No fracture or dislocation. Moderate narrowing of the medial compartment with bony spurring. Mild lateral spurring. Mild patellofemoral degenerative change with bony spurring. Trace suprapatellar effusion. IMPRESSION: No acute osseous abnormality. Mild to moderate arthritis with trace suprapatellar effusion Electronically Signed   By: Donavan Foil M.D.   On: 07/28/2016 17:01   Dg Knee 1-2 Views Right  Result Date: 07/28/2016 CLINICAL DATA:  Acute onset of bilateral  knee pain EXAM: RIGHT KNEE - 1-2 VIEW COMPARISON:  None. FINDINGS: Moderate to marked narrowing of the medial compartment with bony spurring. Mild narrowing and spurring of the lateral compartment. Disc space calcification laterally. Mild lateral deviation of the patella. Mild to moderate patellofemoral degenerative changes with bony spurring. Small suprapatellar effusion. IMPRESSION: 1. No acute osseous abnormality. Moderate arthritis of the right knee with small suprapatellar effusion 2. Mild lateral deviation of the patella 3. Chondrocalcinosis Electronically Signed   By: Donavan Foil M.D.   On: 07/28/2016 17:06     Assessment and Plan :  Acute pain of both knees - Plan: DG Knee 1-2 Views Left, DG Knee 1-2 Views Right, meloxicam (MOBIC) 15 MG tablet   Suspect arthritis is the cause of the patient's pain - suspect that the right knee is worse and when she changed her gait the left started to hurt.  Suspect her recent surgery played a role in today's pain as well as her weight.  Pt felt much better while on the mobic - she will restart that medication.  Expect patient to maybe benefit from knee injections if the mobic does not clear her pain - ? Need for referral to ortho.   Encouraged patient to think about getting general medical care.  Windell Hummingbird PA-C  [facility] 07/28/2016 5:50 PM

## 2016-07-28 NOTE — Patient Instructions (Addendum)
Restart the mobic as this is likely helping your pain.  Elevate your legs to help with aching    IF you received an x-ray today, you will receive an invoice from [facility]. Please contact [facility] at [phone] with questions or concerns regarding your invoice.   IF you received labwork today, you will receive an invoice from [facility]. Please contact [facility] at [phone] with questions or concerns regarding your invoice.   Our billing staff will not be able to assist you with questions regarding bills from these companies.  You will be contacted with the lab results as soon as they are available. The fastest way to get your results is to activate your My Chart account. Instructions are located on the last page of this paperwork. If you have not heard from [place] regarding the results in 2 weeks, please contact this office.

## 2016-08-11 ENCOUNTER — Telehealth: Payer: Self-pay | Admitting: Physician Assistant

## 2016-08-11 NOTE — Telephone Encounter (Signed)
Pt states that she never received a call back regarding the xray report on her knee and is still having trouble with the knee and  Needs to know what to do next   Best number [phone]

## 2016-08-12 NOTE — Telephone Encounter (Signed)
Sarah, please see this message. Thanks. If you want me to call the patient, please release the results.

## 2016-08-13 NOTE — Telephone Encounter (Signed)
Please let the patient know that she has arthritis in both of her knees.  This is likely something that she will deal with for likely the rest of the life though there wlll be times when it is better than others.  The mobic should help with that. We could try an injection in the office of the worse knee to see if that will help.  If the patient would like to consider knee injections other than steroids or further treatment like surgery an ortho would be the correct person for that and I can place a referral - if the patient wants this please see if there is one that she wants to see.

## 2016-08-14 NOTE — Telephone Encounter (Signed)
Called and spoke with patient, informed of results, told her about the options she has, but she don't agree to have injection in her knee, or to go to ortho. She is wondering if a prednisone could help , and she is ready to try if you send a prescription. Thanks.

## 2016-08-17 NOTE — Telephone Encounter (Signed)
The risks/side effects of oral steroids is more than the benefits and if she wants to do steroids they need to go directly into her joint to really help her pain.

## 2016-08-18 NOTE — Telephone Encounter (Signed)
Called and spoke with patient, she still don't want any steroids injection, but if the pain gets more severe she will call us.

## 2016-09-15 ENCOUNTER — Telehealth: Payer: Self-pay | Admitting: *Deleted

## 2016-09-15 DIAGNOSIS — M79605 Pain in left leg: Principal | ICD-10-CM

## 2016-09-15 DIAGNOSIS — M79604 Pain in right leg: Secondary | ICD-10-CM

## 2016-09-15 NOTE — Telephone Encounter (Signed)
Pt states she read that Mobic could cause cramps in the legs, worse at night. I spoke with pt and she had surgery in 04/2016 although she had said in 06/30/2016, pt states the pain started in 07/24/2016. Pt denies heat, swelling, redness or hardness to the calfs, only the pain worse at night and after resting. I told pt is would have the schedulers contact her to see if we could get her in tomorrow with Dr. Amalia Hailey. I told her if she did develop redness, increase pain, swelling, heat or firmness to the calfs to go to ED. Pt states understanding.

## 2016-09-23 ENCOUNTER — Encounter: Payer: Self-pay | Admitting: Podiatry

## 2016-09-23 ENCOUNTER — Ambulatory Visit (INDEPENDENT_AMBULATORY_CARE_PROVIDER_SITE_OTHER): Payer: PPO | Admitting: Podiatry

## 2016-09-23 DIAGNOSIS — M170 Bilateral primary osteoarthritis of knee: Secondary | ICD-10-CM

## 2016-09-23 MED ORDER — TRAMADOL HCL 50 MG PO TABS: 50.0000 mg | ORAL_TABLET | Freq: Three times a day (TID) | ORAL | 0 refills | Status: DC | PRN

## 2016-09-25 NOTE — Progress Notes (Signed)
   HPI: 76 year old female presents to office today for bilateral knee pain. She does have a history of surgery to the bilateral fifth toes and she has no complaints. She states that her toes and the ingrown toenail procedure left great toe is doing well. Date of surgery 04/30/2016. Today the patient complains of bilateral knee pain. She's been having chronic swelling and knee pain for the past 6 weeks. The pain extends to the posterior knee and extends down distally to the calf. Patient states that she started having pain after taking meloxicam.   Physical Exam: General: The patient is alert and oriented x3 in no acute distress.  Dermatology: Skin is warm, dry and supple bilateral lower extremities. Negative for open lesions or macerations.  Vascular: Palpable pedal pulses bilaterally. No edema or erythema noted. Capillary refill within normal limits.  Neurological: Epicritic and protective threshold grossly intact bilaterally.   Musculoskeletal Exam: Range of motion within normal limits to all pedal and ankle joints bilateral. Muscle strength 5/5 in all groups bilateral.  There is a moderate edema noted to the bilateral knee joints anterior and posterior.  Assessment: 1. 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 Patient was evaluated. 2. Today splint the patient that I do not treat or manage her knee pain. Today like to refer the patient to Dr. Eduard Roux, Orthopedic Surgeon, for referral regarding treatment management of bilateral knee pain. 3. Return to clinic when necessary   Edrick Kins, DPM [facility]  Dr. Edrick Kins, DPM    [address],  [address]                Office [phone]  Fax [phone]

## 2016-09-28 NOTE — Telephone Encounter (Signed)
-----   Message from Edrick Kins, DPM sent at 09/25/2016  6:46 PM EDT ----- Regarding: Ortho Referral Will you please refer the patient to Dr. Eduard Roux, Ortho, [facility], for bilateral chronic knee pain.  Thanks, Dr. Amalia Hailey

## 2016-09-28 NOTE — Telephone Encounter (Addendum)
-----   Message from Edrick Kins, DPM sent at 09/25/2016  6:46 PM EDT ----- Regarding: Ortho Referral Will you please refer the patient to Dr. Eduard Roux, Ortho, [facility], for bilateral chronic knee pain.  Thanks, Dr. Amalia Hailey.09/28/2016-Faxed referral to [facility] [employer].

## 2016-11-09 ENCOUNTER — Telehealth: Payer: Self-pay

## 2016-11-09 NOTE — Telephone Encounter (Signed)
Called pt to schedule Medicare Annual Wellness Visit. -nr    Josepha Pigg, B.A.  Care Guide [phone]

## 2018-04-01 IMAGING — DX DG CHEST 2V
2 series · 2 of 2 positions shown · non-contrast
Comparison: 01/18/2012

CLINICAL DATA: Cough

EXAM:
CHEST  2 VIEW

[chest pa]
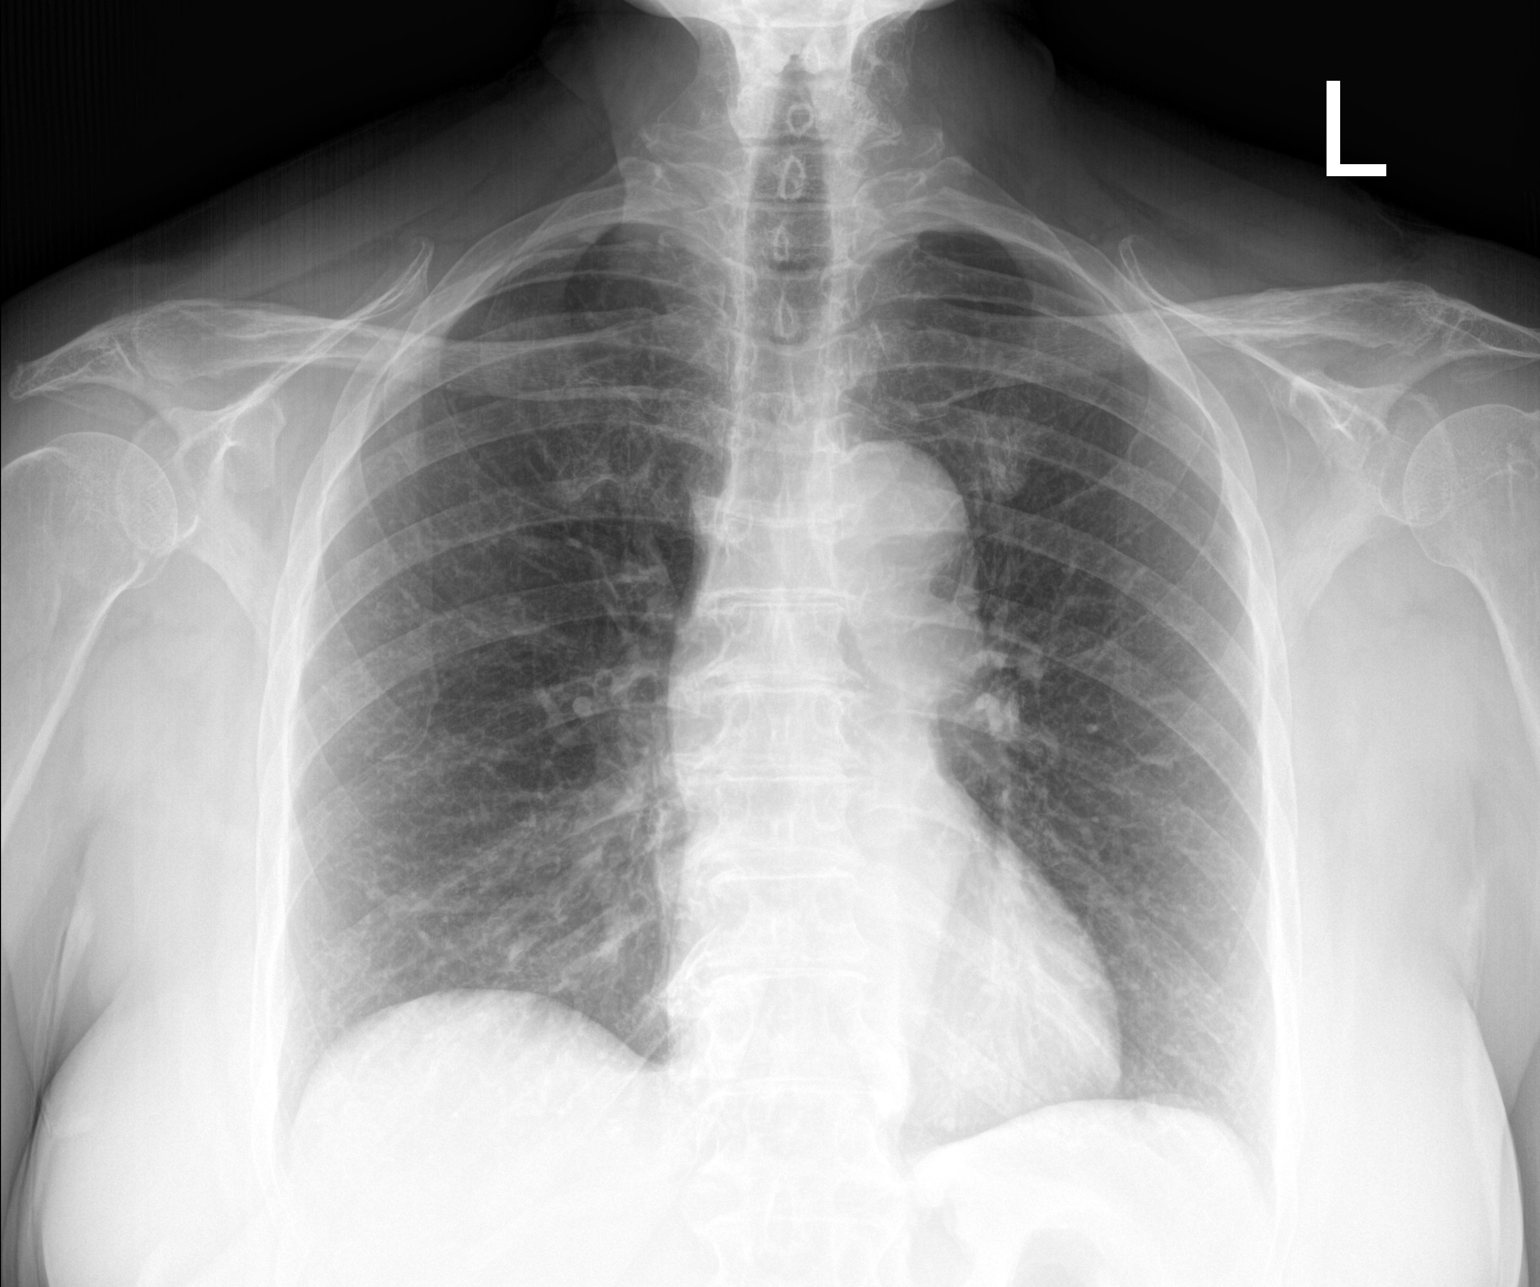

[chest lat]
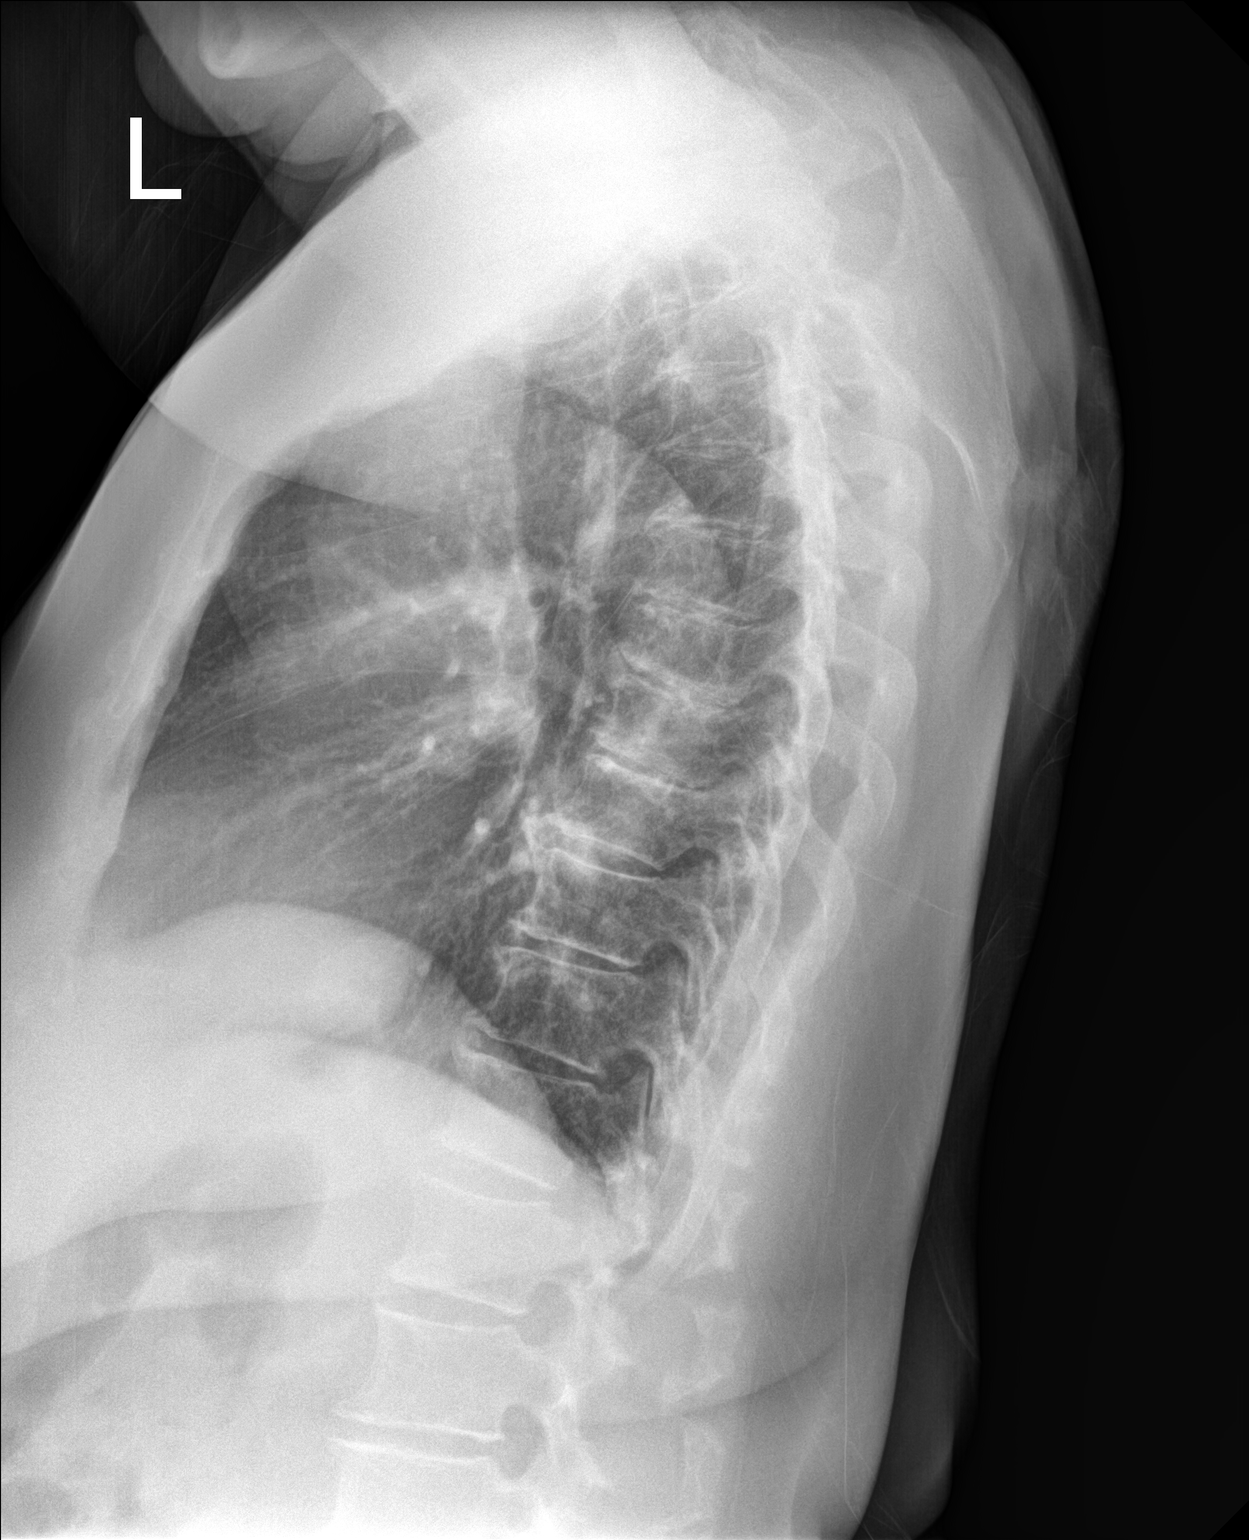

[2 of 2 positions shown; findings below may reference images not displayed]

FINDINGS: Heart and mediastinal contours are within normal limits. No focal
opacities or effusions. No acute bony abnormality.
IMPRESSION: No active cardiopulmonary disease.

## 2018-05-16 ENCOUNTER — Encounter: Payer: Self-pay | Admitting: Podiatry

## 2018-05-16 ENCOUNTER — Other Ambulatory Visit: Payer: Self-pay

## 2018-05-16 ENCOUNTER — Ambulatory Visit: Payer: PPO | Admitting: Podiatry

## 2018-05-16 DIAGNOSIS — L600 Ingrowing nail: Secondary | ICD-10-CM | POA: Diagnosis not present

## 2018-05-17 NOTE — Progress Notes (Signed)
   Subjective: Patient presents today for evaluation of pain to the medial border of the right hallux that began a few months ago. Patient is concerned for possible ingrown nail. Wearing shoes and applying pressure to the toe increases the pain. She has not done anything for treatment. Patient presents today for further treatment and evaluation.  Past Medical History:  Diagnosis Date  . Asthma   . Cataract     Objective:  General: Well developed, nourished, in no acute distress, alert and oriented x3   Dermatology: Skin is warm, dry and supple bilateral. Medial border right hallux appears to be erythematous with evidence of an ingrowing nail. Pain on palpation noted to the border of the nail fold. The remaining nails appear unremarkable at this time. There are no open sores, lesions.  Vascular: Dorsalis Pedis artery and Posterior Tibial artery pedal pulses palpable. No lower extremity edema noted.   Neruologic: Grossly intact via light touch bilateral.  Musculoskeletal: Muscular strength within normal limits in all groups bilateral. Normal range of motion noted to all pedal and ankle joints.   Assesement: #1 Paronychia with ingrowing nail medial border right hallux  #2 Incurvated nail  Plan of Care:  1. Patient evaluated.  2. Mechanical debridement of the right great toenail performed using a nail nipper. Filed with dremel without incident.  3. Recommended good shoe gear.  4. Return to clinic as needed.     Edrick Kins, DPM [facility]  Dr. Edrick Kins, [address]                                        [place], [place] [address]                Office [phone]  Fax [phone]

## 2018-07-04 ENCOUNTER — Ambulatory Visit: Payer: PPO | Admitting: Podiatry

## 2018-07-06 ENCOUNTER — Ambulatory Visit: Payer: PPO | Admitting: Podiatry

## 2018-07-06 ENCOUNTER — Other Ambulatory Visit: Payer: Self-pay

## 2018-07-06 VITALS — Temp 97.9°F

## 2018-07-06 DIAGNOSIS — M79674 Pain in right toe(s): Secondary | ICD-10-CM

## 2018-07-06 DIAGNOSIS — L600 Ingrowing nail: Secondary | ICD-10-CM

## 2018-07-06 MED ORDER — GENTAMICIN SULFATE 0.1 % EX CREA: 1.0000 "application " | TOPICAL_CREAM | Freq: Two times a day (BID) | CUTANEOUS | 1 refills | Status: DC

## 2018-07-06 NOTE — Patient Instructions (Signed)

## 2018-07-10 NOTE — Progress Notes (Signed)
   Subjective:78 year old female presenting today for follow up evaluation of pain to the medial border of the right great toe. She reports continued pain and states the nail is still ingrown. She has not done anything at home for treatment. Wearing shoes and applying pressure to the nail increases the pain. Patient is here for further evaluation and treatment.   Past Medical History:  Diagnosis Date  . Asthma   . Cataract     Objective:  General: Well developed, nourished, in no acute distress, alert and oriented x3   Dermatology: Skin is warm, dry and supple bilateral. Medial border of the right great toe appears to be erythematous with evidence of an ingrowing nail. Pain on palpation noted to the border of the nail fold. The remaining nails appear unremarkable at this time. There are no open sores, lesions.  Vascular: Dorsalis Pedis artery and Posterior Tibial artery pedal pulses palpable. No lower extremity edema noted.   Neruologic: Grossly intact via light touch bilateral.  Musculoskeletal: Muscular strength within normal limits in all groups bilateral. Normal range of motion noted to all pedal and ankle joints.   Assessment:  #1 Paronychia with ingrowing nail medial border right great toe #2 Pain in toe #3 Incurvated nail  Plan of Care:  1. Patient evaluated.  2. Discussed treatment alternatives and plan of care. Explained nail avulsion procedure and post procedure course to patient. 3. Patient opted for total temporary nail avulsion of the right great toenail.  4. Prior to procedure, local anesthesia infiltration utilized using 3 ml of a 50:50 mixture of 2% plain lidocaine and 0.5% plain marcaine in a normal hallux block fashion and a betadine prep performed.  5. Light dressing applied. 6. Prescription for Gentamicin cream provided to patient to use daily with a bandage.  7. Return to clinic in 3 weeks.   Edrick Kins, DPM [facility]  Dr. Edrick Kins,  [address]                                        [place], [place] [address]                Office [phone]  Fax [phone]

## 2018-07-27 ENCOUNTER — Other Ambulatory Visit: Payer: Self-pay

## 2018-07-27 ENCOUNTER — Encounter: Payer: Self-pay | Admitting: Podiatry

## 2018-07-27 ENCOUNTER — Ambulatory Visit: Payer: PPO | Admitting: Podiatry

## 2018-07-27 VITALS — Temp 97.9°F

## 2018-07-27 DIAGNOSIS — L600 Ingrowing nail: Secondary | ICD-10-CM

## 2018-07-27 DIAGNOSIS — M79674 Pain in right toe(s): Secondary | ICD-10-CM | POA: Diagnosis not present

## 2018-07-30 NOTE — Progress Notes (Signed)
   Subjective: Patient presents today status post total temporary nail avulsion procedure of the right hallux that was performed on 07/06/2018. Patient states that the toe and nail fold is feeling much better. She has been soaking the toe for treatment. She denies any drainage. Patient is here for further evaluation and treatment.   Past Medical History:  Diagnosis Date  . Asthma   . Cataract     Objective: Skin is warm, dry and supple. Nail bed and respective nail fold appears to be healing appropriately. Open wound to the associated nail fold with a granular wound base and moderate amount of fibrotic tissue. Minimal drainage noted.   Assessment: #1 postop temporary total nail avulsion right hallux  #2 open wound periungual nail fold and nail bed of respective digit.   Plan of care: #1 patient was evaluated  #2 debridement of open wound was performed to the periungual border and nail fold of the respective toe using a currette. Antibiotic ointment and Band-Aid was applied. #3 patient is to return to clinic on a PRN  basis.   Edrick Kins, DPM [facility]  Dr. Edrick Kins, [address]                                        [place], [place] [address]                Office [phone]  Fax [phone]

## 2018-08-26 ENCOUNTER — Other Ambulatory Visit: Payer: Self-pay

## 2018-08-26 ENCOUNTER — Emergency Department (HOSPITAL_COMMUNITY): Payer: PPO

## 2018-08-26 ENCOUNTER — Encounter (HOSPITAL_COMMUNITY): Payer: Self-pay | Admitting: *Deleted

## 2018-08-26 ENCOUNTER — Emergency Department (HOSPITAL_COMMUNITY)
Admission: EM | Admit: 2018-08-26 | Discharge: 2018-08-26 | Disposition: A | Discharge status: Home / Self Care | Payer: PPO | Attending: Emergency Medicine | Admitting: Emergency Medicine

## 2018-08-26 DIAGNOSIS — R062 Wheezing: Secondary | ICD-10-CM

## 2018-08-26 DIAGNOSIS — R0602 Shortness of breath: Secondary | ICD-10-CM | POA: Diagnosis not present

## 2018-08-26 DIAGNOSIS — R0789 Other chest pain: Secondary | ICD-10-CM | POA: Insufficient documentation

## 2018-08-26 DIAGNOSIS — R079 Chest pain, unspecified: Secondary | ICD-10-CM | POA: Diagnosis not present

## 2018-08-26 DIAGNOSIS — Z8709 Personal history of other diseases of the respiratory system: Secondary | ICD-10-CM | POA: Insufficient documentation

## 2018-08-26 DIAGNOSIS — J45901 Unspecified asthma with (acute) exacerbation: Secondary | ICD-10-CM

## 2018-08-26 DIAGNOSIS — I471 Supraventricular tachycardia, unspecified: Secondary | ICD-10-CM

## 2018-08-26 DIAGNOSIS — Z20828 Contact with and (suspected) exposure to other viral communicable diseases: Secondary | ICD-10-CM | POA: Diagnosis not present

## 2018-08-26 DIAGNOSIS — R05 Cough: Secondary | ICD-10-CM | POA: Insufficient documentation

## 2018-08-26 DIAGNOSIS — R059 Cough, unspecified: Secondary | ICD-10-CM

## 2018-08-26 LAB — CBC WITH DIFFERENTIAL/PLATELET
Abs Immature Granulocytes: 0.06 10*3/uL (ref 0.00–0.07)
Basophils Absolute: 0 10*3/uL (ref 0.0–0.1)
Basophils Relative: 0 %
Eosinophils Absolute: 0 10*3/uL (ref 0.0–0.5)
Eosinophils Relative: 0 %
HCT: 42.5 % (ref 36.0–46.0)
Hemoglobin: 14.1 g/dL (ref 12.0–15.0)
Immature Granulocytes: 1 %
Lymphocytes Relative: 14 %
Lymphs Abs: 1.4 10*3/uL (ref 0.7–4.0)
MCH: 30.3 pg (ref 26.0–34.0)
MCHC: 33.2 g/dL (ref 30.0–36.0)
MCV: 91.2 fL (ref 80.0–100.0)
Monocytes Absolute: 0.7 10*3/uL (ref 0.1–1.0)
Monocytes Relative: 7 %
Neutro Abs: 7.8 10*3/uL — ABNORMAL HIGH (ref 1.7–7.7)
Neutrophils Relative %: 78 %
Platelets: 154 10*3/uL (ref 150–400)
RBC: 4.66 MIL/uL (ref 3.87–5.11)
RDW: 15.6 % — ABNORMAL HIGH (ref 11.5–15.5)
WBC: 10.1 10*3/uL (ref 4.0–10.5)
nRBC: 0.3 % — ABNORMAL HIGH (ref 0.0–0.2)

## 2018-08-26 LAB — BRAIN NATRIURETIC PEPTIDE: B Natriuretic Peptide: 929.5 pg/mL — ABNORMAL HIGH (ref 0.0–100.0)

## 2018-08-26 LAB — BASIC METABOLIC PANEL
Anion gap: 15 (ref 5–15)
BUN: 47 mg/dL — ABNORMAL HIGH (ref 8–23)
CO2: 17 mmol/L — ABNORMAL LOW (ref 22–32)
Calcium: 8.8 mg/dL — ABNORMAL LOW (ref 8.9–10.3)
Chloride: 105 mmol/L (ref 98–111)
Creatinine, Ser: 1.3 mg/dL — ABNORMAL HIGH (ref 0.44–1.00)
GFR calc Af Amer: 46 mL/min — ABNORMAL LOW (ref >60)
GFR calc non Af Amer: 40 mL/min — ABNORMAL LOW (ref >60)
Glucose, Bld: 148 mg/dL — ABNORMAL HIGH (ref 70–99)
Potassium: 4.1 mmol/L (ref 3.5–5.1)
Sodium: 137 mmol/L (ref 135–145)

## 2018-08-26 LAB — SARS CORONAVIRUS 2 BY RT PCR (HOSPITAL ORDER, PERFORMED IN ~~LOC~~ HOSPITAL LAB): SARS Coronavirus 2: NEGATIVE

## 2018-08-26 LAB — TROPONIN I (HIGH SENSITIVITY)
Troponin I (High Sensitivity): 28 ng/L — ABNORMAL HIGH (ref <18)
Troponin I (High Sensitivity): 38 ng/L — ABNORMAL HIGH (ref <18)
Troponin I (High Sensitivity): 42 ng/L — ABNORMAL HIGH (ref <18)

## 2018-08-26 LAB — TSH: TSH: 2.066 u[IU]/mL (ref 0.350–4.500)

## 2018-08-26 MED ORDER — SODIUM CHLORIDE 0.9 % IV BOLUS
500.0000 mL | Freq: Once | INTRAVENOUS | Status: AC
  Administered 2018-08-26: 500 mL via INTRAVENOUS

## 2018-08-26 MED ORDER — ALBUTEROL SULFATE HFA 108 (90 BASE) MCG/ACT IN AERS: 2.0000 | INHALATION_SPRAY | RESPIRATORY_TRACT | 1 refills | Status: DC | PRN

## 2018-08-26 MED ORDER — DILTIAZEM HCL 25 MG/5ML IV SOLN
20.0000 mg | Freq: Once | INTRAVENOUS | Status: AC
  Administered 2018-08-26: 11:00:00 20 mg via INTRAVENOUS
  Filled 2018-08-26: qty 5

## 2018-08-26 NOTE — Discharge Instructions (Signed)
Please follow-up and establish care with a primary care provider by calling the number circled on your paperwork.  The cardiologist office should also be reaching out to you.  Please return to the emergency department if you develop any new or worsening symptoms.

## 2018-08-26 NOTE — ED Notes (Signed)
Pt verbalized understanding of discharge paperwork, prescriptions and follow-up care. Nephew is picking up pt

## 2018-08-26 NOTE — ED Triage Notes (Signed)
C/o sob  Onset 1 week ago, coughing productive at times. C/o chest pain onset off and on  . Denies chest pain

## 2018-08-26 NOTE — ED Provider Notes (Signed)
[facility] EMERGENCY DEPARTMENT Provider Note   CSN: [id number] Arrival date & time: 08/26/18  1003    History   Chief Complaint Chief Complaint  Patient presents with  . Shortness of Breath    HPI Gina Guerrero is a 78 y.o. female with history of asthma who presents with a one-week history of shortness of breath.  Patient has had some intermittent chest pain, but has not had any in the past 3 to 4 days.  She is also had some intermittent cough.  She has felt like her heart has been racing a few times, but does not feel that now.  She denies any fever, abdominal pain, nausea, vomiting, urinary symptoms, leg swelling.  Patient has no history of cardiac problems.  No interventions taken prior to arrival     HPI  Past Medical History:  Diagnosis Date  . Asthma   . Cataract     Patient Active Problem List   Diagnosis Date Noted  . Chronic cough 05/13/2016    Past Surgical History:  Procedure Laterality Date  . ABDOMINAL HYSTERECTOMY    . EYE SURGERY       OB History   No obstetric history on file.      Home Medications    Prior to Admission medications   Medication Sig Start Date End Date Taking? Authorizing Provider  Multiple Vitamins-Minerals (PRESERVISION/LUTEIN PO) Take 1 tablet by mouth daily.   Yes [provider]  albuterol (VENTOLIN HFA) 108 (90 Base) MCG/ACT inhaler Inhale 2 puffs into the lungs every 4 (four) hours as needed for wheezing or shortness of breath (cough, shortness of breath or wheezing.). 08/26/18   Anatalia Kronk, Bea Graff, PA-C  meloxicam (MOBIC) 15 MG tablet Take 1 tablet (15 mg total) by mouth daily. Patient not taking: Reported on 08/26/2018 07/28/16   Mancel Bale, PA-C  omeprazole (PRILOSEC) 40 MG capsule Take 1 capsule (40 mg total) by mouth daily. Patient not taking: Reported on 08/26/2018 05/13/16 06/12/16  Valentina Shaggy, MD  traMADol (ULTRAM) 50 MG tablet Take 1 tablet (50 mg total) by mouth every 8 (eight) hours  as needed. Patient not taking: Reported on 08/26/2018 09/23/16   Edrick Kins, DPM    Family History Family History  Problem Relation Age of Onset  . Allergic rhinitis Neg Hx   . Angioedema Neg Hx   . Asthma Neg Hx   . Eczema Neg Hx   . Immunodeficiency Neg Hx   . Urticaria Neg Hx     Social History Social History   Tobacco Use  . Smoking status: Never Smoker  . Smokeless tobacco: Never Used  Substance Use Topics  . Alcohol use: No  . Drug use: Not on file     Allergies   Patient has no known allergies.   Review of Systems Review of Systems  Constitutional: Negative for chills and fever.  HENT: Negative for facial swelling and sore throat.   Respiratory: Positive for cough and shortness of breath.   Cardiovascular: Positive for chest pain and palpitations.  Gastrointestinal: Negative for abdominal pain, nausea and vomiting.  Genitourinary: Negative for dysuria.  Musculoskeletal: Negative for back pain.  Skin: Negative for rash and wound.  Neurological: Negative for headaches.  Psychiatric/Behavioral: The patient is not nervous/anxious.      Physical Exam Updated Vital Signs BP (!) 130/93   Pulse 69   Temp 97.6 F (36.4 C) (Oral)   Resp 20   Ht 4' 11.5" (  1.511 m)   Wt 90.7 kg   SpO2 98%   BMI 39.72 kg/m   Physical Exam Vitals signs and nursing note reviewed.  Constitutional:      General: She is not in acute distress.    Appearance: She is well-developed. She is not diaphoretic.  HENT:     Head: Normocephalic and atraumatic.     Mouth/Throat:     Pharynx: No oropharyngeal exudate.  Eyes:     General: No scleral icterus.       Right eye: No discharge.        Left eye: No discharge.     Conjunctiva/sclera: Conjunctivae normal.     Pupils: Pupils are equal, round, and reactive to light.  Neck:     Musculoskeletal: Normal range of motion and neck supple.     Thyroid: No thyromegaly.  Cardiovascular:     Rate and Rhythm: Regular rhythm.  Tachycardia present.     Heart sounds: Normal heart sounds. No murmur. No friction rub. No gallop.      Comments: Initially, patient had tachycardia in the 190s with regular rhythm Pulmonary:     Effort: Pulmonary effort is normal. No respiratory distress.     Breath sounds: Normal breath sounds. No stridor. No wheezing or rales.  Abdominal:     General: Bowel sounds are normal. There is no distension.     Palpations: Abdomen is soft.     Tenderness: There is no abdominal tenderness. There is no guarding or rebound.  Lymphadenopathy:     Cervical: No cervical adenopathy.  Skin:    General: Skin is warm and dry.     Coloration: Skin is not pale.     Findings: No rash.  Neurological:     Mental Status: She is alert.     Coordination: Coordination normal.      ED Treatments / Results  Labs (all labs ordered are listed, but only abnormal results are displayed) Labs Reviewed  BASIC METABOLIC PANEL - Abnormal; Notable for the following components:      Result Value   CO2 17 (*)    Glucose, Bld 148 (*)    BUN 47 (*)    Creatinine, Ser 1.30 (*)    Calcium 8.8 (*)    GFR calc non Af Amer 40 (*)    GFR calc Af Amer 46 (*)    All other components within normal limits  CBC WITH DIFFERENTIAL/PLATELET - Abnormal; Notable for the following components:   RDW 15.6 (*)    nRBC 0.3 (*)    Neutro Abs 7.8 (*)    All other components within normal limits  TROPONIN I (HIGH SENSITIVITY) - Abnormal; Notable for the following components:   Troponin I (High Sensitivity) 28 (*)    All other components within normal limits  BRAIN NATRIURETIC PEPTIDE - Abnormal; Notable for the following components:   B Natriuretic Peptide 929.5 (*)    All other components within normal limits  TROPONIN I (HIGH SENSITIVITY) - Abnormal; Notable for the following components:   Troponin I (High Sensitivity) 38 (*)    All other components within normal limits  TROPONIN I (HIGH SENSITIVITY) - Abnormal; Notable for  the following components:   Troponin I (High Sensitivity) 42 (*)    All other components within normal limits  SARS CORONAVIRUS 2 (HOSPITAL ORDER, [facility])  TSH    EKG EKG Interpretation  Date/Time:  Friday August 26 2018 10:42:42 EDT Ventricular Rate:  78 PR Interval:    QRS Duration: 114 QT Interval:  374 QTC Calculation: 426 R Axis:   -60 Text Interpretation:  Sinus rhythm Probable left atrial enlargement Left anterior fascicular block Abnormal R-wave progression, early transition Borderline repolarization abnormality flipped t wves in inferior and lateral leads Otherwise no significant change Confirmed by Deno Etienne [phone]) on 08/26/2018 12:48:16 PM   Radiology Dg Chest Portable 1 View  Result Date: 08/26/2018 CLINICAL DATA:  Intermittent chest pain with shortness of breath and cough for the past week. EXAM: PORTABLE CHEST 1 VIEW COMPARISON:  Chest x-ray dated March 26, 2016. FINDINGS: The heart size and mediastinal contours are within normal limits. Central peribronchial thickening. No focal consolidation, pleural effusion, or pneumothorax. No acute osseous abnormality. IMPRESSION: 1. Bronchitic changes. Electronically Signed   By: Titus Dubin M.D.   On: 08/26/2018 11:22    Procedures Procedures (including critical care time)  Medications Ordered in ED Medications  sodium chloride 0.9 % bolus 500 mL (0 mLs Intravenous Stopped 08/26/18 1206)  diltiazem (CARDIZEM) injection 20 mg (20 mg Intravenous Given 08/26/18 1037)     Initial Impression / Assessment and Plan / ED Course  I have reviewed the triage vital signs and the nursing notes.  Pertinent labs & imaging results that were available during my care of the patient were reviewed by me and considered in my medical decision making (see chart for details).  Clinical Course as of Aug 26 808  Fri Aug 26, 2018  1051 Patient given diltiazem 20mg  to control heart rates and patient's heart rate  went from the 190s to 75 with normal sinus rhythm.  On reassessment, patient is feeling back to baseline.  She denies any shortness of breath or chest pain.   [AL]    Clinical Course User Index [AL] Frederica Kuster, PA-C       Patient presenting with SVT suspected for the past week, as patient states she has been short of breath for that time.  Patient given single dose of diltiazem 20 mg and she is back into normal sinus rhythm with rates around 75.  She is feeling much better and denies any shortness of breath.  BNP is elevated to 929.  Troponin is mildly elevated.  BMP shows BUN 47, creatinine 1.30.  Patient given IV fluids in the ED.  COVID test negative.  TSH within normal limits.  I discussed these findings with Dr. Caryl Comes with cardiology who advised they were expected with patient's week history of SVT.  He is comfortable with the patient being discharged home with follow-up outpatient in their office.  Cardiology will contact the patient.  Will not start any medications at this time, although patient did ask for albuterol inhaler refill.  Patient advised to establish care with PCP as well.  Return precautions discussed.  Patient vitals stable and discharged in satisfactory condition.  Patient also evaluated by my attending, Dr. Tyrone Nine, who guided the patient's management and agrees with plan  Final Clinical Impressions(s) / ED Diagnoses   Final diagnoses:  SVT (supraventricular tachycardia) [facility])    ED Discharge Orders         Ordered    albuterol (VENTOLIN HFA) 108 (90 Base) MCG/ACT inhaler  Every 4 hours PRN     08/26/18 1603           LawBea Graff, PA-C 08/27/18 [place], DO 08/27/18 1108

## 2018-08-30 ENCOUNTER — Encounter: Payer: Self-pay | Admitting: Allergy & Immunology

## 2018-08-30 ENCOUNTER — Ambulatory Visit (INDEPENDENT_AMBULATORY_CARE_PROVIDER_SITE_OTHER): Payer: PPO | Admitting: Allergy & Immunology

## 2018-08-30 ENCOUNTER — Other Ambulatory Visit: Payer: Self-pay

## 2018-08-30 VITALS — BP 142/90 | HR 77 | Temp 98.0°F | Resp 18 | Ht 59.5 in | Wt 225.0 lb

## 2018-08-30 DIAGNOSIS — J3089 Other allergic rhinitis: Secondary | ICD-10-CM

## 2018-08-30 DIAGNOSIS — R7989 Other specified abnormal findings of blood chemistry: Secondary | ICD-10-CM | POA: Diagnosis not present

## 2018-08-30 DIAGNOSIS — R0602 Shortness of breath: Secondary | ICD-10-CM | POA: Diagnosis not present

## 2018-08-30 NOTE — Patient Instructions (Addendum)
1. Shortness of breath - You had an elevated BNP which shows that you have a problem with possible congestive heart failure. - We are going to refer to you to see Dr. Vear Clock at [facility]. - We will go ahead and put that referral in and they should be getting a hold of you later this week.  - I think you just need a fluid pill most likely, but we will see what the Cardiologist says.  - Continue with the albuterol as needed since this seems to have gotten better.   2. Chronic rhinitis (dust mites, cat) - Continue with fluticasone one spray daily as needed.  3. Return in about 6 months (around 03/02/2019). This can be an in-person, a virtual Webex or a telephone follow up visit.     Please inform us of any Emergency Department visits, hospitalizations, or changes in symptoms. Call us before going to the ED for breathing or allergy symptoms since we might be able to fit you in for a sick visit. Feel free to contact us anytime with any questions, problems, or concerns.  It was a pleasure to see you again today! You definitely need a primary care doctor to help coordinate your care!   Any [facility] or [facility] Physician are great choices!   Websites that have reliable patient information: 1. American Academy of Asthma, Allergy, and Immunology: www.aaaai.org 2. Food Allergy Research and Education (FARE): foodallergy.org 3. Mothers of Asthmatics: http://www.asthmacommunitynetwork.org 4. American College of Allergy, Asthma, and Immunology: www.acaai.org  "Like" [place] on Facebook and Instagram for our latest updates!      Make sure you are registered to vote! If you have moved or changed any of your contact information, you will need to get this updated before voting!  In some cases, you MAY be able to register to vote online: [email or web address]    Voter ID laws are NOT going into effect for the General Election in November 2020! DO NOT  let this stop you from exercising your right to vote!   Absentee voting is the SAFEST way to vote during the coronavirus pandemic!   Download and print an absentee ballot request form at [email or web address] or you can scan the QR code below with your smart phone:      More information on absentee ballots can be found here: [email or web address]

## 2018-08-30 NOTE — Progress Notes (Signed)
FOLLOW UP  Date of Service/Encounter:  08/30/18   Assessment:   Moderate persistent asthma - apparently controlled since the last visit two years ago  Shortness of breath - with elevated BNP and mildly elevated troponins on a recent ED visit  Allergic rhinitis (dust mite, cat)  Adverse food reaction (shellfish)  Needs a Primary Care Provider   Asthma Reportables:  Severity: moderate persistent  Risk: low Control: not well controlled  Plan/Recommendations:   1. Shortness of breath - You had an elevated BNP which shows that you have a problem with possible congestive heart failure. - We are going to refer to you to see Dr. Vear Clock at [facility]. - We will go ahead and put that referral in and they should be getting a hold of you later this week.  - I think you just need a fluid pill most likely, but we will see what the Cardiologist says.  - Continue with the albuterol as needed since this seems to have gotten better.   2. Chronic rhinitis (dust mites, cat) - Continue with fluticasone one spray daily as needed.  3. Return in about 6 months (around 03/02/2019). This can be an in-person, a virtual Webex or a telephone follow up visit.   Subjective:   Gina Guerrero is a 78 y.o. female presenting today for follow up of  Chief Complaint  Patient presents with  . Asthma    she was wheezing and was seen at the ED on Friday. elevated troponin. she did have a negative COVID test.   . Allergic Rhinitis     Gina Guerrero has a history of the following: Patient Active Problem List   Diagnosis Date Noted  . Shortness of breath 08/30/2018  . Elevated brain natriuretic peptide (BNP) level 08/30/2018  . Perennial allergic rhinitis 08/30/2018  . Chronic cough 05/13/2016    History obtained from: chart review and patient.  Gina Guerrero is a 78 y.o. female presenting for a follow up visit.  She was last seen in April 2018.  At that time, her lung function looked  normal.  She was endorsing mucus production and worsening cough, so I treated her empirically for bronchitis with azithromycin and prednisone.  We also changed her from Advair to Breo 200/25 mcg 1 puff once daily.  We stopped her omeprazole since this did not seem to be helping.  She has a history of sensitizations to dust mite and cat.  We started Flonase 2 sprays per nostril daily because we felt that the postnasal drip was contributing to her cough.  We also obtained an extended mold panel to see if she was sensitized to molds.  We recommended continued avoidance of shrimp.  We did test a seafood panel and this was completely negative.  We recommended introducing this at home.  The extended mold panel showed only a sensitization to Candida albicans.  Since last visit, she has mostly done well. She reports that she was wheezing which prompted her to actually go to the ED. Per the note, they discussed the case with Cards and it was felt that she was stable to be discharged, event with a BNP of nearly 1000 and mildly elevated troponins. She has not seen a Cardiologist at all (apparently the discussion in the ED was just over the phone).  Chest x-ray showed bronchitic changes.    She is interested in seeing her husband's cardiologist - Dr. Vear Clock - at [facility]. She is open to our  making an appointment for her. She also does not have a PCP and would be open to [place] providing some recommendations regarding establishing with a PCP.  Regarding her asthma, she says that this is been well controlled until the beginning of July.  She does mention Advair as a controller, but it is unclear who would have been filling in since she has not seen [place] in 2 years and she has no PCP.  Otherwise, there have been no changes to her past medical history, surgical history, family history, or social history.    Review of Systems  Constitutional: Negative.  Negative for chills, fever, malaise/fatigue and  weight loss.  HENT: Negative.  Negative for congestion, ear discharge and ear pain.   Eyes: Negative for pain, discharge and redness.  Respiratory: Positive for shortness of breath. Negative for cough, sputum production and wheezing.   Cardiovascular: Positive for leg swelling. Negative for chest pain and palpitations.  Gastrointestinal: Negative for abdominal pain, constipation, diarrhea, heartburn, nausea and vomiting.  Genitourinary: Positive for frequency.  Skin: Negative.  Negative for itching and rash.  Neurological: Negative for dizziness and headaches.  Endo/Heme/Allergies: Negative for environmental allergies. Does not bruise/bleed easily.       Objective:   Blood pressure (!) 142/90, pulse 77, temperature 98 F (36.7 C), temperature source Temporal, resp. rate 18, height 4' 11.5" (1.511 m), weight 225 lb (102.1 kg), SpO2 97 %. Body mass index is 44.68 kg/m.   Physical Exam:  Physical Exam  Constitutional: She appears well-developed.  Pleasant female.  HENT:  Head: Normocephalic and atraumatic.  Right Ear: Tympanic membrane, external ear and ear canal normal.  Left Ear: Tympanic membrane, external ear and ear canal normal.  Nose: Mucosal edema and rhinorrhea present. No nasal deformity or septal deviation. No epistaxis. Right sinus exhibits no maxillary sinus tenderness and no frontal sinus tenderness. Left sinus exhibits no maxillary sinus tenderness and no frontal sinus tenderness.  Mouth/Throat: Uvula is midline and oropharynx is clear and moist. Mucous membranes are not pale and not dry.  Eyes: Pupils are equal, round, and reactive to light. Conjunctivae and EOM are normal. Right eye exhibits no chemosis and no discharge. Left eye exhibits no chemosis and no discharge. Right conjunctiva is not injected. Left conjunctiva is not injected.  Cardiovascular: Normal rate, regular rhythm and normal heart sounds.  Respiratory: Effort normal and breath sounds normal. No  accessory muscle usage. No tachypnea. No respiratory distress. She has no wheezes. She has no rhonchi. She has no rales. She exhibits no tenderness.  Moving air well in all lung fields.  There is slightly decreased air movement at the bases.  Musculoskeletal:     Comments: She does have clearly swollen lower extremities.  Lymphadenopathy:    She has no cervical adenopathy.  Neurological: She is alert.  Skin: No abrasion, no petechiae and no rash noted. Rash is not papular, not vesicular and not urticarial. No erythema. No pallor.  Alopecia.  Psychiatric: She has a normal mood and affect.     Diagnostic studies: none     Salvatore Marvel, MD  [facility] [place] [facility] [place]

## 2018-08-31 ENCOUNTER — Telehealth: Payer: Self-pay

## 2018-08-31 NOTE — Telephone Encounter (Signed)
Looks like the referral has been placed to the correct office.  I will follow back up tomorrow to see if the patient has been scheduled.

## 2018-08-31 NOTE — Progress Notes (Signed)
Looks like the referral has been placed to the correct office.  I will follow back up tomorrow to see if the patient has been scheduled.   Thanks

## 2018-08-31 NOTE — Telephone Encounter (Signed)
-----   Message from Valentina Shaggy, MD sent at 08/30/2018 11:42 PM EDT ----- Urgent Cardiology referral placed for Dr. Vear Clock at [facility].

## 2018-09-13 ENCOUNTER — Other Ambulatory Visit: Payer: Self-pay

## 2018-09-13 ENCOUNTER — Encounter: Payer: Self-pay | Admitting: Family Medicine

## 2018-09-13 ENCOUNTER — Ambulatory Visit (INDEPENDENT_AMBULATORY_CARE_PROVIDER_SITE_OTHER): Payer: PPO | Admitting: Family Medicine

## 2018-09-13 ENCOUNTER — Telehealth: Payer: Self-pay

## 2018-09-13 VITALS — BP 140/86 | HR 88 | Temp 98.5°F | Ht 59.5 in | Wt 218.0 lb

## 2018-09-13 DIAGNOSIS — M25511 Pain in right shoulder: Secondary | ICD-10-CM

## 2018-09-13 DIAGNOSIS — Z780 Asymptomatic menopausal state: Secondary | ICD-10-CM

## 2018-09-13 DIAGNOSIS — I471 Supraventricular tachycardia: Secondary | ICD-10-CM

## 2018-09-13 DIAGNOSIS — R8290 Unspecified abnormal findings in urine: Secondary | ICD-10-CM

## 2018-09-13 DIAGNOSIS — G8929 Other chronic pain: Secondary | ICD-10-CM

## 2018-09-13 DIAGNOSIS — N289 Disorder of kidney and ureter, unspecified: Secondary | ICD-10-CM | POA: Diagnosis not present

## 2018-09-13 DIAGNOSIS — R351 Nocturia: Secondary | ICD-10-CM | POA: Diagnosis not present

## 2018-09-13 DIAGNOSIS — Z23 Encounter for immunization: Secondary | ICD-10-CM

## 2018-09-13 LAB — POCT URINALYSIS DIP (MANUAL ENTRY)
Bilirubin, UA: NEGATIVE
Blood, UA: NEGATIVE
Glucose, UA: NEGATIVE mg/dL
Ketones, POC UA: NEGATIVE mg/dL
Nitrite, UA: NEGATIVE
Protein Ur, POC: NEGATIVE mg/dL
Spec Grav, UA: 1.01 (ref 1.010–1.025)
Urobilinogen, UA: 0.2 E.U./dL
pH, UA: 5 (ref 5.0–8.0)

## 2018-09-13 LAB — BASIC METABOLIC PANEL
BUN/Creatinine Ratio: 17 (ref 12–28)
BUN: 15 mg/dL (ref 8–27)
CO2: 24 mmol/L (ref 20–29)
Calcium: 9.5 mg/dL (ref 8.7–10.3)
Chloride: 105 mmol/L (ref 96–106)
Creatinine, Ser: 0.86 mg/dL (ref 0.57–1.00)
GFR calc Af Amer: 75 mL/min/{1.73_m2} (ref >59)
GFR calc non Af Amer: 65 mL/min/{1.73_m2} (ref >59)
Glucose: 98 mg/dL (ref 65–99)
Potassium: 4.9 mmol/L (ref 3.5–5.2)
Sodium: 143 mmol/L (ref 134–144)

## 2018-09-13 MED ORDER — DICLOFENAC SODIUM 1 % TD GEL: 2.0000 g | Freq: Four times a day (QID) | TRANSDERMAL | 3 refills | Status: DC

## 2018-09-13 NOTE — Patient Instructions (Signed)
° ° ° °  If you have lab work done today you will be contacted with your lab results within the next 2 weeks.  If you have not heard from us then please contact us. The fastest way to get your results is to register for My Chart. ° ° °IF you received an x-ray today, you will receive an invoice from [facility]. Please contact  Radiology at [phone] with questions or concerns regarding your invoice.  ° °IF you received labwork today, you will receive an invoice from [facility]. Please contact [facility] at [phone] with questions or concerns regarding your invoice.  ° °Our billing staff will not be able to assist you with questions regarding bills from these companies. ° °You will be contacted with the lab results as soon as they are available. The fastest way to get your results is to activate your My Chart account. Instructions are located on the last page of this paperwork. If you have not heard from us regarding the results in 2 weeks, please contact this office. °  ° ° ° °

## 2018-09-13 NOTE — Telephone Encounter (Signed)
Spoke to Gina Guerrero at [employer], was told the original referral was put into epic incorrectly. Referral coordinator, Rise Paganini has fixed the problem and the pt will be receiving a call soon for appt.

## 2018-09-13 NOTE — Progress Notes (Signed)
7/21/202010:36 AM  [name] [birth year], 78 y.o., female [id number]  Chief Complaint  Patient presents with  . Follow-up    went to ed 3 wks ago originally for wheezing, was told that she is chf. Needs cards referral. Having lots of tachycardia and fatigue  . Urinary Urgency    drinks lots of water, yet has some burning  . Immunizations    wants to talk pvenar 13    HPI:   Patient is a 78 y.o. female who presents today for ER followup  Seen in ED  On August 26 2018, dx with SVT, resolved with dilt bolus, elevated BNP and trop 2/2 week of SVT, normal TSH  Saw asthma and allergy clinic July 2020 - saw them for SOB, referred to cards, albuterol prn  Has not seen cards yet, referral placed on July 7th Had a short lived event of palpitations In general no chest pain, cough, SOB, edema, dysuria, hematuria, abd pain, nausea, vomiting She is experiencing fatigue, polyuria  Right shoulder pain, known OA Bothers her at night  Depression screen [facility] 2/9 09/13/2018 09/13/2018 07/28/2016  Decreased Interest 0 0 0  Down, Depressed, Hopeless 0 0 0  PHQ - 2 Score 0 0 0    Fall Risk  09/13/2018 07/28/2016 03/26/2016 02/25/2016 02/21/2016  Falls in the past year? 0 No No No No  Number falls in past yr: 0 - - - -  Injury with Fall? 0 - - - -     No Known Allergies  Prior to Admission medications   Medication Sig Start Date End Date Taking? Authorizing Provider  albuterol (VENTOLIN HFA) 108 (90 Base) MCG/ACT inhaler Inhale 2 puffs into the lungs every 4 (four) hours as needed for wheezing or shortness of breath (cough, shortness of breath or wheezing.). 08/26/18  Yes Law, Bea Graff, PA-C    Past Medical History:  Diagnosis Date  . Asthma   . Cataract     Past Surgical History:  Procedure Laterality Date  . ABDOMINAL HYSTERECTOMY    . EYE SURGERY      Social History   Tobacco Use  . Smoking status: Never Smoker  . Smokeless tobacco: Never Used  Substance Use Topics  . Alcohol  use: No    Family History  Problem Relation Age of Onset  . Allergic rhinitis Neg Hx   . Angioedema Neg Hx   . Asthma Neg Hx   . Eczema Neg Hx   . Immunodeficiency Neg Hx   . Urticaria Neg Hx     ROS Per hpi  OBJECTIVE:  Today's Vitals   09/13/18 0952  BP: 140/86  Pulse: 88  Temp: 98.5 F (36.9 C)  TempSrc: Oral  SpO2: 96%  Weight: 218 lb (98.9 kg)  Height: 4' 11.5" (1.511 m)   Body mass index is 43.29 kg/m.   Physical Exam Vitals signs and nursing note reviewed.  Constitutional:      Appearance: She is well-developed.  HENT:     Head: Normocephalic and atraumatic.     Mouth/Throat:     Pharynx: No oropharyngeal exudate.  Eyes:     General: No scleral icterus.    Conjunctiva/sclera: Conjunctivae normal.     Pupils: Pupils are equal, round, and reactive to light.  Neck:     Musculoskeletal: Neck supple.  Cardiovascular:     Rate and Rhythm: Normal rate and regular rhythm.     Heart sounds: Normal heart sounds. No murmur. No friction rub. No  gallop.   Pulmonary:     Effort: Pulmonary effort is normal.     Breath sounds: Normal breath sounds. No wheezing or rales.  Skin:    General: Skin is warm and dry.  Neurological:     Mental Status: She is alert and oriented to person, place, and time.     Results for orders placed or performed in visit on 09/13/18 (from the past 24 hour(s))  POCT urinalysis dipstick     Status: Abnormal   Collection Time: 09/13/18 10:40 AM  Result Value Ref Range   Color, UA yellow yellow   Clarity, UA clear clear   Glucose, UA negative negative mg/dL   Bilirubin, UA negative negative   Ketones, POC UA negative negative mg/dL   Spec Grav, UA 1.010 1.010 - 1.025   Blood, UA negative negative   pH, UA 5.0 5.0 - 8.0   Protein Ur, POC negative negative mg/dL   Urobilinogen, UA 0.2 0.2 or 1.0 E.U./dL   Nitrite, UA Negative Negative   Leukocytes, UA Trace (A) Negative    No results found.   ASSESSMENT and PLAN  1.  Paroxysmal SVT (supraventricular tachycardia) ([place]) Patient has had one more event since ED. Today is normal rate. Reached out to cards, referral corrected, they will call patient to make appt.  2. Need for vaccination - Pneumococcal conjugate vaccine 13-valent IM  3. Postmenopausal estrogen deficiency - DG Bone Density; Future  4. Nocturia - POCT urinalysis dipstick  5. Function kidney decreased - Basic Metabolic Panel  6. Chronic right shoulder pain No oral nsaids given decreased kidney function with recent labs. Discussed trial of topical diclofenac and/or OTC APAP  7. Abnormal urinalysis - Urine Culture  Other orders - diclofenac sodium (VOLTAREN) 1 % GEL; Apply 2 g topically 4 (four) times daily.  Return in about 3 months (around 12/14/2018).    Rutherford Guys, MD Primary Care at [place], [place] [address] Ph.  [phone] Fax [phone]

## 2018-09-14 ENCOUNTER — Ambulatory Visit (INDEPENDENT_AMBULATORY_CARE_PROVIDER_SITE_OTHER): Payer: PPO | Admitting: Cardiology

## 2018-09-14 ENCOUNTER — Encounter: Payer: Self-pay | Admitting: Cardiology

## 2018-09-14 VITALS — BP 135/62 | HR 82 | Temp 98.7°F | Ht 59.5 in | Wt 218.0 lb

## 2018-09-14 DIAGNOSIS — I471 Supraventricular tachycardia, unspecified: Secondary | ICD-10-CM | POA: Insufficient documentation

## 2018-09-14 DIAGNOSIS — R0602 Shortness of breath: Secondary | ICD-10-CM | POA: Diagnosis not present

## 2018-09-14 LAB — URINE CULTURE

## 2018-09-14 MED ORDER — DILTIAZEM HCL 30 MG PO TABS: 30.0000 mg | ORAL_TABLET | Freq: Three times a day (TID) | ORAL | 2 refills | Status: DC

## 2018-09-14 NOTE — Assessment & Plan Note (Signed)
Two likely episodes since 7/3.  Episode on 7/18 at home appears to have self-resolved.  EKG in office shows resolved T-wave inversions compared to hospital EKG.  Elevated BNP in hospital and elevated trop, likely secondary to demand. No evidence of ACS or fluid overload in office.  TSH normal. -Start Diltiazem BID -Take two additional tablets for acute SVT episodes at home -Attempt valsalva maneuvers at home during SVT episodes -TTE -Follow up in 2-3 months -Call if episodes become frequent or poorly controlled

## 2018-09-14 NOTE — Progress Notes (Deleted)
Primary Physician:  Rutherford Guys, MD   Patient ID: Gina Guerrero, female    DOB: Aug 18, 1940, 78 y.o.   MRN: [id number]  Subjective:    Chief Complaint  Patient presents with  . Shortness of Breath    pt c/o sob 1 month on exertion     HPI: Gina Guerrero  is a 78 y.o. female  with ***  Past Medical History:  Diagnosis Date  . Asthma   . Cataract     Past Surgical History:  Procedure Laterality Date  . ABDOMINAL HYSTERECTOMY    . EYE SURGERY      Social History   Socioeconomic History  . Marital status: Married    Spouse name: Not on file  . Number of children: Not on file  . Years of education: Not on file  . Highest education level: Not on file  Occupational History  . Not on file  Social Needs  . Financial resource strain: Not on file  . Food insecurity    Worry: Not on file    Inability: Not on file  . Transportation needs    Medical: Not on file    Non-medical: Not on file  Tobacco Use  . Smoking status: Never Smoker  . Smokeless tobacco: Never Used  Substance and Sexual Activity  . Alcohol use: No  . Drug use: Not on file  . Sexual activity: Not on file  Lifestyle  . Physical activity    Days per week: Not on file    Minutes per session: Not on file  . Stress: Not on file  Relationships  . Social Herbalist on phone: Not on file    Gets together: Not on file    Attends religious service: Not on file    Active member of club or organization: Not on file    Attends meetings of clubs or organizations: Not on file    Relationship status: Not on file  . Intimate partner violence    Fear of current or ex partner: Not on file    Emotionally abused: Not on file    Physically abused: Not on file    Forced sexual activity: Not on file  Other Topics Concern  . Not on file  Social History Narrative   Lives with husband and is currently taking care of her niece who is dying of brain cancer.     No children.    ROS    Objective:   Blood pressure 135/62, pulse 82, temperature 98.7 F (37.1 C), height 4' 11.5" (1.511 m), weight 218 lb (98.9 kg), SpO2 98 %. Body mass index is 43.29 kg/m.    Physical Exam Radiology: No results found.  Laboratory examination:   *** CMP Latest Ref Rng & Units 09/13/2018 08/26/2018 03/26/2016  Glucose 65 - 99 mg/dL 98 148(H) 102(H)  BUN 8 - 27 mg/dL 15 47(H) 19  Creatinine 0.57 - 1.00 mg/dL 0.86 1.30(H) 0.78  Sodium 134 - 144 mmol/L 143 137 141  Potassium 3.5 - 5.2 mmol/L 4.9 4.1 3.8  Chloride 96 - 106 mmol/L 105 105 101  CO2 20 - 29 mmol/L 24 17(L) 24  Calcium 8.7 - 10.3 mg/dL 9.5 8.8(L) 9.1  Total Protein 6.0 - 8.5 g/dL - - 7.0  Total Bilirubin 0.0 - 1.2 mg/dL - - 0.4  Alkaline Phos 39 - 117 IU/L - - 87  AST 0 - 40 IU/L - - 20  ALT 0 - 32  IU/L - - 13   CBC Latest Ref Rng & Units 08/26/2018 03/26/2016 02/13/2016  WBC 4.0 - 10.5 K/uL 10.1 7.9 8.1  Hemoglobin 12.0 - 15.0 g/dL 14.1 14.2 14.1  Hematocrit 36.0 - 46.0 % 42.5 42.1 41.1  Platelets 150 - 400 K/uL 154 237 -   Lipid Panel  No results found for: CHOL, TRIG, HDL, CHOLHDL, VLDL, LDLCALC, LDLDIRECT HEMOGLOBIN A1C No results found for: HGBA1C, MPG TSH Recent Labs    08/26/18 1025  TSH 2.066    PRN Meds:. There are no discontinued medications. Current Meds  Medication Sig  . albuterol (VENTOLIN HFA) 108 (90 Base) MCG/ACT inhaler Inhale 2 puffs into the lungs every 4 (four) hours as needed for wheezing or shortness of breath (cough, shortness of breath or wheezing.).  Marland Kitchen diclofenac sodium (VOLTAREN) 1 % GEL Apply 2 g topically 4 (four) times daily.  . fluticasone (FLONASE) 50 MCG/ACT nasal spray Place 2 sprays into both nostrils as needed for allergies or rhinitis.    Cardiac Studies:   ***  Assessment:   Shortness of breath - Plan: EKG 12-Lead  ***  Recommendations:   ***  Miquel Dunn, MSN, APRN, FNP-C [facility]. [place] Office: [phone] Fax: [phone]

## 2018-09-14 NOTE — Progress Notes (Signed)
Primary Physician/Referring:  Rutherford Guys, MD  Patient ID: Gina Guerrero, female    DOB: 1940-08-23, 78 y.o.   MRN: [id number]  Chief Complaint  Patient presents with  . Shortness of Breath    pt c/o sob 1 month on exertion    HPI:    HPI: Gina Guerrero  is a 78 y.o. presenting for a a new patient visit for supraventricular tachycardia.  Her previous medical history is significant for asthma and a sister who recently underwent open heart surgery (unknown reason/procedure).  She reports being seen in the ED on 7/3 following 2 weeks of shortness of breath and wheezing.  She believed that she was merely experiencing an asthma exacerbation.  While in the ED, she was found to have SVT with an HR up to 193.  Vagal maneuvers failed and SVT was successfully aborted following a diltiazem bolus.  Her EKG reverted to NSR with T-wave inversions in inferior and lateral leads.  Labs were notable for mildly elevated high sensitivity troponin, and elevated BNP. She was discharged from the ED with close follow up with her PCP.  She was seen by her PCP on 7/6 and was well appearing at that visit.  She was referred to cardiology for further management of SVT.  Today in clinic, she reports no SOB, chest pain or palpitations since her ED visit.  She notes that she did feel unwell over the weekend on 7/18 and took her vitals to find that she had a normal blood pressure but and elevated heart rate to 141.  She had not recently taken her albuterol prior to this tachycardic episode.  This episode seems to have resolved spontaneously.  She noted that she was attempting valsalva maneuvers at home which may have contributed.  Past Medical History:  Diagnosis Date  . Asthma   . Cataract    Past Surgical History:  Procedure Laterality Date  . ABDOMINAL HYSTERECTOMY    . EYE SURGERY     Social History   Socioeconomic History  . Marital status: Married    Spouse name: Not on file  . Number of children: Not on  file  . Years of education: Not on file  . Highest education level: Not on file  Occupational History  . Not on file  Social Needs  . Financial resource strain: Not on file  . Food insecurity    Worry: Not on file    Inability: Not on file  . Transportation needs    Medical: Not on file    Non-medical: Not on file  Tobacco Use  . Smoking status: Never Smoker  . Smokeless tobacco: Never Used  Substance and Sexual Activity  . Alcohol use: No  . Drug use: Not on file  . Sexual activity: Not on file  Lifestyle  . Physical activity    Days per week: Not on file    Minutes per session: Not on file  . Stress: Not on file  Relationships  . Social Herbalist on phone: Not on file    Gets together: Not on file    Attends religious service: Not on file    Active member of club or organization: Not on file    Attends meetings of clubs or organizations: Not on file    Relationship status: Not on file  . Intimate partner violence    Fear of current or ex partner: Not on file    Emotionally abused: Not on  file    Physically abused: Not on file    Forced sexual activity: Not on file  Other Topics Concern  . Not on file  Social History Narrative   Lives with husband and is currently taking care of her niece who is dying of brain cancer.     No children.   ROS  Review of Systems  Constitution: Positive for malaise/fatigue (mildly decreased energy following her ED visit). Negative for decreased appetite.  HENT: Negative for congestion.   Cardiovascular: Negative for chest pain, leg swelling and palpitations.  Respiratory: Negative for cough and wheezing.   Neurological: Negative for excessive daytime sleepiness and weakness.   Objective  Blood pressure 135/62, pulse 82, temperature 98.7 F (37.1 C), height 4' 11.5" (1.511 m), weight 218 lb (98.9 kg), SpO2 98 %. Body mass index is 43.29 kg/m.   Physical Exam  Constitutional: She is oriented to person, place, and time.  She appears well-developed and well-nourished.  HENT:  Head: Normocephalic.  Eyes: Pupils are equal, round, and reactive to light.  Neck: Normal range of motion. Neck supple.  Cardiovascular: Normal rate, regular rhythm, normal heart sounds and intact distal pulses. Exam reveals no gallop.  No murmur heard. Radial, DP and popliteal pulses normal.  Pulmonary/Chest: Effort normal and breath sounds normal. No respiratory distress. She has no wheezes. She exhibits no tenderness.  Abdominal: Soft. Bowel sounds are normal. There is no abdominal tenderness.  Musculoskeletal: Normal range of motion.        General: No edema.  Neurological: She is alert and oriented to person, place, and time.  Skin: Skin is warm and dry. No rash noted.  Psychiatric: She has a normal mood and affect. Her behavior is normal.   Radiology: No results found.  Laboratory examination:   CMP Latest Ref Rng & Units 09/13/2018 08/26/2018 03/26/2016  Glucose 65 - 99 mg/dL 98 148(H) 102(H)  BUN 8 - 27 mg/dL 15 47(H) 19  Creatinine 0.57 - 1.00 mg/dL 0.86 1.30(H) 0.78  Sodium 134 - 144 mmol/L 143 137 141  Potassium 3.5 - 5.2 mmol/L 4.9 4.1 3.8  Chloride 96 - 106 mmol/L 105 105 101  CO2 20 - 29 mmol/L 24 17(L) 24  Calcium 8.7 - 10.3 mg/dL 9.5 8.8(L) 9.1  Total Protein 6.0 - 8.5 g/dL - - 7.0  Total Bilirubin 0.0 - 1.2 mg/dL - - 0.4  Alkaline Phos 39 - 117 IU/L - - 87  AST 0 - 40 IU/L - - 20  ALT 0 - 32 IU/L - - 13   CBC Latest Ref Rng & Units 08/26/2018 03/26/2016 02/13/2016  WBC 4.0 - 10.5 K/uL 10.1 7.9 8.1  Hemoglobin 12.0 - 15.0 g/dL 14.1 14.2 14.1  Hematocrit 36.0 - 46.0 % 42.5 42.1 41.1  Platelets 150 - 400 K/uL 154 237 -   Lipid Panel  No results found for: CHOL, TRIG, HDL, CHOLHDL, VLDL, LDLCALC, LDLDIRECT HEMOGLOBIN A1C No results found for: HGBA1C, MPG TSH Recent Labs    08/26/18 1025  TSH 2.066   Medications   Current Outpatient Medications  Medication Instructions  . albuterol (VENTOLIN HFA) 108 (90  Base) MCG/ACT inhaler 2 puffs, Inhalation, Every 4 hours PRN  . diclofenac sodium (VOLTAREN) 2 g, Topical, 4 times daily  . diltiazem (CARDIZEM) 30 mg, Oral, 3 times daily, Take 2 additional tablets with heart palpitations  . fluticasone (FLONASE) 50 MCG/ACT nasal spray 2 sprays, Each Nare, As needed    Cardiac Studies:   None  Assessment  ICD-10-CM   1. PSVT (paroxysmal supraventricular tachycardia) (HCC)  I47.1 PCV ECHOCARDIOGRAM COMPLETE    diltiazem (CARDIZEM) 30 MG tablet   Long RP tachycardia : AVRT  2. Shortness of breath  R06.02 EKG 12-Lead  3. Morbid obesity ([place])  E66.01     EKG 09/14/2018: Normal sinus rhythm at rate of 87 bpm, left atrial abnormality, left axis deviation, left anterior fascicular block.  Cannot exclude inferior infarct old. Poor R wave progression, cannot exclude anterior infarct old.  IVCD, borderline criteria for LVH.    EKG 08/26/2018: SVT at rate of 193 bpm, left axis deviation, incomplete right bundle branch block.  Nonspecific T abnormality. Repeat EKG reveals probable ectopic atrial rhythm with short PR interval, leftward axis, poor R-wave progression, incomplete right bundle branch block.  Diffuse nonspecific T abnormality, cannot exclude inferior and anterolateral ischemia.  ST T abnormalities may have been due to either demand ischemia or memory effect S/P SVT examination.  Recommendations:   Supraventricular Tachycardia: Two likely episodes since 7/3.  Episode on 7/18 at home appears to have self-resolved.  EKG in office shows resolved T-wave inversions compared to hospital EKG.  Elevated BNP in hospital and elevated trop, likely secondary to demand. No evidence of ACS or fluid overload in office.  TSH normal. -Start Diltiazem 30 mg TID -Take two additional tablets for acute SVT episodes at home -Attempt valsalva maneuvers at home during SVT episodes -Follow up TTE -Follow up in 2-3 months -Call if episodes become frequent or poorly  controlled   Adrian Prows, MD, [facility] 09/14/2018, 3:14 PM [facility]. [place] Pager: [phone] Office: [phone] If no answer Cell [phone]

## 2018-09-21 ENCOUNTER — Telehealth: Payer: Self-pay

## 2018-09-21 NOTE — Telephone Encounter (Signed)
She may take the medication-diltiazem- as needed for palpitation. No need to take everyday.  Thanks MJP

## 2018-09-21 NOTE — Telephone Encounter (Signed)
Pt aware of instructions.//ah

## 2018-09-21 NOTE — Telephone Encounter (Signed)
Telephone encounter:  Reason for call: She was given med for palpitations. Mentioned she is feeling better, should she continue medication (does not remember name of medicine)   Usual provider: [place] office visit: 7/22  Next office visit: 9/23   Last hospitalization:   Current Outpatient Medications on File Prior to Visit  Medication Sig Dispense Refill  . albuterol (VENTOLIN HFA) 108 (90 Base) MCG/ACT inhaler Inhale 2 puffs into the lungs every 4 (four) hours as needed for wheezing or shortness of breath (cough, shortness of breath or wheezing.). 6.7 g 1  . diclofenac sodium (VOLTAREN) 1 % GEL Apply 2 g topically 4 (four) times daily. 100 g 3  . diltiazem (CARDIZEM) 30 MG tablet Take 1 tablet (30 mg total) by mouth 3 (three) times daily. Take 2 additional tablets with heart palpitations 90 tablet 2  . fluticasone (FLONASE) 50 MCG/ACT nasal spray Place 2 sprays into both nostrils as needed for allergies or rhinitis.     No current facility-administered medications on file prior to visit.

## 2018-10-14 ENCOUNTER — Other Ambulatory Visit: Payer: Self-pay

## 2018-10-14 ENCOUNTER — Ambulatory Visit (INDEPENDENT_AMBULATORY_CARE_PROVIDER_SITE_OTHER): Payer: PPO

## 2018-10-14 DIAGNOSIS — I471 Supraventricular tachycardia: Secondary | ICD-10-CM

## 2018-10-18 NOTE — Progress Notes (Signed)
Pt aware.

## 2018-11-15 ENCOUNTER — Encounter: Payer: Self-pay | Admitting: Cardiology

## 2018-11-16 ENCOUNTER — Other Ambulatory Visit: Payer: Self-pay

## 2018-11-16 ENCOUNTER — Ambulatory Visit: Payer: PPO

## 2018-11-16 ENCOUNTER — Encounter: Payer: Self-pay | Admitting: Cardiology

## 2018-11-16 ENCOUNTER — Ambulatory Visit (INDEPENDENT_AMBULATORY_CARE_PROVIDER_SITE_OTHER): Payer: PPO | Admitting: Cardiology

## 2018-11-16 VITALS — BP 133/77 | HR 77 | Ht 59.0 in | Wt 219.5 lb

## 2018-11-16 DIAGNOSIS — I471 Supraventricular tachycardia: Secondary | ICD-10-CM

## 2018-11-16 DIAGNOSIS — R0602 Shortness of breath: Secondary | ICD-10-CM

## 2018-11-16 MED ORDER — DILTIAZEM HCL ER COATED BEADS 120 MG PO CP24: 120.0000 mg | ORAL_CAPSULE | Freq: Every day | ORAL | 1 refills | Status: DC

## 2018-11-16 NOTE — Patient Instructions (Signed)
Please get your blood test done today

## 2018-11-16 NOTE — Progress Notes (Signed)
Primary Physician/Referring:  Rutherford Guys, MD  Patient ID: Gina Guerrero, female    DOB: 1940-06-07, 78 y.o.   MRN: [id number]  Chief Complaint  Patient presents with  . Tachycardia  . Follow-up   HPI:    HPI: Gina Guerrero  is a 78 y.o. presenting for a a new patient visit for supraventricular tachycardia.  Her previous medical history is significant for asthma.  She was seen in the emergency room on 08/26/2018 when she presented with shortness of breath and wheezing and was also found to have SVT with a heart rate up to 193 bpm.  She spontaneously converted to sinus rhythm after IV diltiazem.  I had last seen her 2 months ago, she now presents for follow-up. She is now having frequent episodes of palpitations that lasts several hours and has also used diltiazem that she has taken p.r.n. basis for termination.  Well several maneuver does help.  But each episode is lasting anywhere from a few minutes to sometimes up to 2-3 hours.  Dyspnea has remained stable and she has not had any recent asthma exacerbation.  Past Medical History:  Diagnosis Date  . Asthma   . Cataract    Past Surgical History:  Procedure Laterality Date  . ABDOMINAL HYSTERECTOMY    . EYE SURGERY     Social History   Socioeconomic History  . Marital status: Married    Spouse name: Not on file  . Number of children: 0  . Years of education: Not on file  . Highest education level: Not on file  Occupational History  . Not on file  Social Needs  . Financial resource strain: Not on file  . Food insecurity    Worry: Not on file    Inability: Not on file  . Transportation needs    Medical: Not on file    Non-medical: Not on file  Tobacco Use  . Smoking status: Never Smoker  . Smokeless tobacco: Never Used  Substance and Sexual Activity  . Alcohol use: No  . Drug use: Never  . Sexual activity: Not on file  Lifestyle  . Physical activity    Days per week: Not on file    Minutes per session: Not on  file  . Stress: Not on file  Relationships  . Social Herbalist on phone: Not on file    Gets together: Not on file    Attends religious service: Not on file    Active member of club or organization: Not on file    Attends meetings of clubs or organizations: Not on file    Relationship status: Not on file  . Intimate partner violence    Fear of current or ex partner: Not on file    Emotionally abused: Not on file    Physically abused: Not on file    Forced sexual activity: Not on file  Other Topics Concern  . Not on file  Social History Narrative   Lives with husband and is currently taking care of her niece who is dying of brain cancer.     No children.   ROS  Review of Systems  Constitution: Negative for chills, decreased appetite, malaise/fatigue and weight gain.  HENT: Negative for congestion.   Cardiovascular: Positive for palpitations. Negative for chest pain, dyspnea on exertion, leg swelling and syncope.  Respiratory: Negative for cough and wheezing.   Endocrine: Negative for cold intolerance.  Hematologic/Lymphatic: Does not bruise/bleed easily.  Musculoskeletal: Negative for joint swelling.  Gastrointestinal: Negative for abdominal pain, anorexia, change in bowel habit, hematochezia and melena.  Neurological: Negative for excessive daytime sleepiness, headaches, light-headedness and weakness.  Psychiatric/Behavioral: Negative for depression and substance abuse.  All other systems reviewed and are negative.  Objective  Blood pressure 133/77, pulse 77, height 4\' 11"  (1.499 m), weight 219 lb 8 oz (99.6 kg), SpO2 97 %. Body mass index is 44.33 kg/m.   Physical Exam  Constitutional: She is oriented to person, place, and time.  Short stay chair, morbidly obese in no acute distress.  HENT:  Head: Normocephalic.  Eyes: Pupils are equal, round, and reactive to light.  Neck: Normal range of motion. Neck supple.  Cardiovascular: Normal rate, regular rhythm,  normal heart sounds, intact distal pulses and normal pulses. Exam reveals no gallop.  No murmur heard. Pulses:      Carotid pulses are 2+ on the right side and 2+ on the left side. No leg edema, no JVD.  Pulmonary/Chest: Effort normal and breath sounds normal. No respiratory distress. She has no wheezes. She exhibits no tenderness.  Abdominal: Soft. Bowel sounds are normal. There is no abdominal tenderness.  Musculoskeletal: Normal range of motion.        General: No edema.  Neurological: She is alert and oriented to person, place, and time.  Skin: Skin is warm and dry. No rash noted.  Psychiatric: She has a normal mood and affect. Her behavior is normal.   Radiology: No results found.  Laboratory examination:   CMP Latest Ref Rng & Units 09/13/2018 08/26/2018 03/26/2016  Glucose 65 - 99 mg/dL 98 148(H) 102(H)  BUN 8 - 27 mg/dL 15 47(H) 19  Creatinine 0.57 - 1.00 mg/dL 0.86 1.30(H) 0.78  Sodium 134 - 144 mmol/L 143 137 141  Potassium 3.5 - 5.2 mmol/L 4.9 4.1 3.8  Chloride 96 - 106 mmol/L 105 105 101  CO2 20 - 29 mmol/L 24 17(L) 24  Calcium 8.7 - 10.3 mg/dL 9.5 8.8(L) 9.1  Total Protein 6.0 - 8.5 g/dL - - 7.0  Total Bilirubin 0.0 - 1.2 mg/dL - - 0.4  Alkaline Phos 39 - 117 IU/L - - 87  AST 0 - 40 IU/L - - 20  ALT 0 - 32 IU/L - - 13   CBC Latest Ref Rng & Units 08/26/2018 03/26/2016 02/13/2016  WBC 4.0 - 10.5 K/uL 10.1 7.9 8.1  Hemoglobin 12.0 - 15.0 g/dL 14.1 14.2 14.1  Hematocrit 36.0 - 46.0 % 42.5 42.1 41.1  Platelets 150 - 400 K/uL 154 237 -   Lipid Panel  No results found for: CHOL, TRIG, HDL, CHOLHDL, VLDL, LDLCALC, LDLDIRECT HEMOGLOBIN A1C No results found for: HGBA1C, MPG TSH Recent Labs    08/26/18 1025  TSH 2.066   Medications   Current Outpatient Medications  Medication Instructions  . albuterol (VENTOLIN HFA) 108 (90 Base) MCG/ACT inhaler 2 puffs, Inhalation, Every 4 hours PRN  . aspirin EC 81 mg, Oral, Daily  . diclofenac sodium (VOLTAREN) 2 g, Topical, 4  times daily  . diltiazem (CARDIZEM) 30 mg, Oral, 3 times daily, Take 2 additional tablets with heart palpitations  . fluticasone (FLONASE) 50 MCG/ACT nasal spray 2 sprays, Each Nare, As needed  . Multiple Vitamins-Minerals (PRESERVISION AREDS 2 PO) 1 tablet, Oral, 2 times daily    Cardiac Studies:   Echocardiogram 10/14/2018: Left ventricle cavity is normal in size. Normal left ventricular wall thickness. Normal LV systolic function with EF 55%. Normal global wall motion.  Doppler evidence of grade I (impaired) diastolic dysfunction, normal LAP.  Left atrial cavity is normal in size. Aneurysmal interatrial septum without 2D or color Doppler evidence of interatrial shunt. Trileaflet aortic valve. Mild aortic valve leaflet calcification. Mild (Grade I) aortic regurgitation. Mild tricuspid regurgitation. Estimated pulmonary artery systolic pressure is 26 mmHg.  Assessment     ICD-10-CM   1. Shortness of breath  R06.02   2. PSVT (paroxysmal supraventricular tachycardia) (HCC)  I47.1   3. Morbid obesity ([place])  E66.01     EKG 09/14/2018: Normal sinus rhythm at rate of 87 bpm, left atrial abnormality, left axis deviation, left anterior fascicular block.  Cannot exclude inferior infarct old. Poor R wave progression, cannot exclude anterior infarct old.  IVCD, borderline criteria for LVH.    EKG 08/26/2018: SVT at rate of 193 bpm, left axis deviation, incomplete right bundle branch block.  Nonspecific T abnormality. Repeat EKG reveals probable ectopic atrial rhythm with short PR interval, leftward axis, poor R-wave progression, incomplete right bundle branch block.  Diffuse nonspecific T abnormality, cannot exclude inferior and anterolateral ischemia.  ST T abnormalities may have been due to either demand ischemia or memory effect S/P SVT examination.  Recommendations:   Patient with symptoms of palpitations related to SVT that occurred first episode on 08/26/2018, patient is aware of Valsalva  maneuver and also to take extra dose of diltiazem if necessary. I seen her 2 months ago, she continues to have frequent episodes of palpitations now lasting for several hours, mostly happens when she wakes up in the morning.  I'll perform an event monitor to confirm persistent SVT.  Indeed if she has frequent episodes or persistent SVT, will refer her for EP evaluation.  Patient is becoming symptomatic with SVT. I have added diltiazem CD 120 mg daily in view of frequent palpitations.   I reviewed the results of the recently performed echocardiogram, normal LVEF, no significant valvular abnormality.  Weight loss was again discussed with the patient with regard to morbid obesity.    She did have abnormal EKG post SVT termination, this may indicate underlying CAD versus nonspecific changes, however she has no other significant cardiovascular risk factors that includes hypertension or diabetes or hyperlipidemia, however lipids have not been checked in quite a while and may benefit from obtaining them.    I will see her back in 3 months for follow-up.  Adrian Prows, MD, [facility] 11/16/2018, 11:36 AM [facility] [place] Pager: [phone] Office: [phone] If no answer Cell [phone]

## 2018-11-19 LAB — LIPID PANEL WITH LDL/HDL RATIO
Cholesterol, Total: 216 mg/dL — ABNORMAL HIGH (ref 100–199)
HDL: 58 mg/dL (ref >39)
LDL Chol Calc (NIH): 146 mg/dL — ABNORMAL HIGH (ref 0–99)
LDL/HDL Ratio: 2.5 ratio (ref 0.0–3.2)
Triglycerides: 70 mg/dL (ref 0–149)
VLDL Cholesterol Cal: 12 mg/dL (ref 5–40)

## 2018-11-21 ENCOUNTER — Other Ambulatory Visit: Payer: Self-pay

## 2018-11-21 MED ORDER — ATORVASTATIN CALCIUM 10 MG PO TABS: 10.0000 mg | ORAL_TABLET | Freq: Every day | ORAL | 3 refills | Status: DC

## 2018-11-21 NOTE — Progress Notes (Signed)
Called pt to inform her about her lab results. Pt is willing to start chol medication, have already send it to her pharmacy.

## 2018-11-23 ENCOUNTER — Other Ambulatory Visit: Payer: Self-pay

## 2018-11-23 ENCOUNTER — Ambulatory Visit
Admission: RE | Admit: 2018-11-23 | Discharge: 2018-11-23 | Disposition: A | Discharge status: Home / Self Care | Payer: PPO | Source: Ambulatory Visit | Attending: Family Medicine | Admitting: Family Medicine

## 2018-11-23 DIAGNOSIS — Z780 Asymptomatic menopausal state: Secondary | ICD-10-CM

## 2018-11-23 DIAGNOSIS — M8589 Other specified disorders of bone density and structure, multiple sites: Secondary | ICD-10-CM | POA: Diagnosis not present

## 2018-11-24 DIAGNOSIS — M8580 Other specified disorders of bone density and structure, unspecified site: Secondary | ICD-10-CM

## 2018-11-24 HISTORY — DX: Other specified disorders of bone density and structure, unspecified site: M85.80

## 2018-12-05 ENCOUNTER — Encounter: Payer: Self-pay | Admitting: Family Medicine

## 2018-12-06 ENCOUNTER — Encounter: Payer: Self-pay | Admitting: Radiology

## 2018-12-15 ENCOUNTER — Ambulatory Visit (INDEPENDENT_AMBULATORY_CARE_PROVIDER_SITE_OTHER): Payer: PPO | Admitting: Family Medicine

## 2018-12-15 ENCOUNTER — Other Ambulatory Visit: Payer: Self-pay

## 2018-12-15 ENCOUNTER — Encounter: Payer: Self-pay | Admitting: Family Medicine

## 2018-12-15 VITALS — BP 128/75 | HR 74 | Temp 98.5°F | Ht 59.0 in | Wt 218.0 lb

## 2018-12-15 DIAGNOSIS — I471 Supraventricular tachycardia, unspecified: Secondary | ICD-10-CM

## 2018-12-15 DIAGNOSIS — Z1231 Encounter for screening mammogram for malignant neoplasm of breast: Secondary | ICD-10-CM | POA: Diagnosis not present

## 2018-12-15 DIAGNOSIS — Z23 Encounter for immunization: Secondary | ICD-10-CM | POA: Diagnosis not present

## 2018-12-15 DIAGNOSIS — M8580 Other specified disorders of bone density and structure, unspecified site: Secondary | ICD-10-CM | POA: Diagnosis not present

## 2018-12-15 DIAGNOSIS — R0982 Postnasal drip: Secondary | ICD-10-CM | POA: Diagnosis not present

## 2018-12-15 NOTE — Progress Notes (Signed)
10/22/202011:17 AM  Gina Guerrero Aug 01, 1940, 78 y.o., female [id number]  Chief Complaint  Patient presents with  . Follow-up    asking for meds that helps with drainage, also wants desiity scan explained    HPI:   Patient is a 78 y.o. female with past medical history significant for pSVT who presents today for followup  Last OV July 2020 Has seen cards - started dilt 120mg  daily, LDL 146 - started atorvastatin 10mg , placed holter, pending referral to EP if holter +  Palpitations and SOB are better on dilt Started atorvastatin - tolerating well She continues to have palpitations at night specially if she is having lots of PND Has not been using flonase daily  She has started taking vitamin D3 1000 units a day, Not taking calcium supplement  Has not had mammogram in years Denies any breast concerns   Depression screen [facility] 2/9 09/13/2018 09/13/2018 07/28/2016  Decreased Interest 0 0 0  Down, Depressed, Hopeless 0 0 0  PHQ - 2 Score 0 0 0    Fall Risk  12/15/2018 09/13/2018 07/28/2016 03/26/2016 02/25/2016  Falls in the past year? 0 0 No No No  Number falls in past yr: 0 0 - - -  Injury with Fall? 0 0 - - -  Risk for fall due to : Impaired mobility;Impaired balance/gait - - - -     No Known Allergies  Prior to Admission medications   Medication Sig Start Date End Date Taking? Authorizing Provider  albuterol (VENTOLIN HFA) 108 (90 Base) MCG/ACT inhaler Inhale 2 puffs into the lungs every 4 (four) hours as needed for wheezing or shortness of breath (cough, shortness of breath or wheezing.). 08/26/18  Yes Law, Alexandra M, PA-C  aspirin EC 81 MG tablet Take 81 mg by mouth daily.   Yes [provider]  atorvastatin (LIPITOR) 10 MG tablet Take 1 tablet (10 mg total) by mouth daily. 11/21/18 12/21/18 Yes Adrian Prows, MD  diclofenac sodium (VOLTAREN) 1 % GEL Apply 2 g topically 4 (four) times daily. 09/13/18  Yes Rutherford Guys, MD  diltiazem (CARDIZEM CD) 120 MG 24 hr  capsule Take 1 capsule (120 mg total) by mouth daily. 11/16/18 02/14/19 Yes Adrian Prows, MD  diltiazem (CARDIZEM) 30 MG tablet Take 1 tablet (30 mg total) by mouth 3 (three) times daily. Take 2 additional tablets with heart palpitations Patient taking differently: Take 30 mg by mouth 3 (three) times daily as needed. Take 2 additional tablets with heart palpitations 09/14/18  Yes Adrian Prows, MD  fluticasone [facility]) 50 MCG/ACT nasal spray Place 2 sprays into both nostrils as needed for allergies or rhinitis.   Yes [provider]  Multiple Vitamins-Minerals (PRESERVISION AREDS 2 PO) Take 1 tablet by mouth 2 (two) times daily.   Yes [provider]    Past Medical History:  Diagnosis Date  . Asthma   . Cataract   . Osteopenia determined by x-ray 11/2018   low FRAX score    Past Surgical History:  Procedure Laterality Date  . ABDOMINAL HYSTERECTOMY    . EYE SURGERY      Social History   Tobacco Use  . Smoking status: Never Smoker  . Smokeless tobacco: Never Used  Substance Use Topics  . Alcohol use: No    Family History  Problem Relation Age of Onset  . Heart disease Sister   . Allergic rhinitis Neg Hx   . Angioedema Neg Hx   . Asthma Neg Hx   .  Eczema Neg Hx   . Immunodeficiency Neg Hx   . Urticaria Neg Hx     Review of Systems  Constitutional: Negative for chills and fever.  Respiratory: Negative for cough and shortness of breath.   Cardiovascular: Negative for chest pain, palpitations and leg swelling.  Gastrointestinal: Negative for abdominal pain, nausea and vomiting.     OBJECTIVE:  Today's Vitals   12/15/18 1110  BP: 128/75  Pulse: 74  Temp: 98.5 F (36.9 C)  SpO2: 98%  Weight: 218 lb (98.9 kg)  Height: 4\' 11"  (1.499 m)   Body mass index is 44.03 kg/m.   Physical Exam Vitals signs and nursing note reviewed.  Constitutional:      Appearance: She is well-developed.  HENT:     Head: Normocephalic and atraumatic.     Mouth/Throat:      Pharynx: No oropharyngeal exudate.  Eyes:     General: No scleral icterus.    Conjunctiva/sclera: Conjunctivae normal.     Pupils: Pupils are equal, round, and reactive to light.  Neck:     Musculoskeletal: Neck supple.  Cardiovascular:     Rate and Rhythm: Normal rate and regular rhythm.     Heart sounds: Normal heart sounds. No murmur. No friction rub. No gallop.   Pulmonary:     Effort: Pulmonary effort is normal.     Breath sounds: Normal breath sounds. No wheezing or rales.  Skin:    General: Skin is warm and dry.  Neurological:     Mental Status: She is alert and oriented to person, place, and time.     No results found for this or any previous visit (from the past 24 hour(s)).  No results found.   ASSESSMENT and PLAN  1. Osteopenia determined by x-ray Reviewed results. Discussed LFM. Checking vitamin D. Repeat dexa in 2 years - Vitamin D, 25-hydroxy  2. SVT (supraventricular tachycardia) ([place]) Managed by cards. Improved on dilt.  3. Post-nasal drip Start flonase every night. Call if not improved in 2-3 weeks  4. Visit for screening mammogram - MM DIGITAL SCREENING BILATERAL; Future  5. Need for prophylactic vaccination and inoculation against influenza - Flu Vaccine QUAD High Dose(Fluad)   Return in about 6 months (around 06/15/2019).    Rutherford Guys, MD [facility] [place], [place] [address] Ph.  [phone] Fax [phone]

## 2018-12-15 NOTE — Patient Instructions (Addendum)
I recommend daily calcium intake of 1200mg , best from food sources, daily vitamin D intake of at least 1000 units, this can be from an over the counter supplement, and daily weight bearing exercises such as walking. We should also check your vitamin D level to make sure you are no deficient.     If you have lab work done today you will be contacted with your lab results within the next 2 weeks.  If you have not heard from [place] then please contact us. The fastest way to get your results is to register for My Chart.   IF you received an x-ray today, you will receive an invoice from [facility]. Please contact [facility] at [phone] with questions or concerns regarding your invoice.   IF you received labwork today, you will receive an invoice from [place]. Please contact [facility] at [phone] with questions or concerns regarding your invoice.   Our billing staff will not be able to assist you with questions regarding bills from these companies.  You will be contacted with the lab results as soon as they are available. The fastest way to get your results is to activate your My Chart account. Instructions are located on the last page of this paperwork. If you have not heard from [place] regarding the results in 2 weeks, please contact this office.     Calcium Content in Foods Calcium is the most abundant mineral in your body. Most of your body's calcium supply is stored in your bones and teeth. Calcium helps many parts of the body function normally, including:  Blood and blood vessels.  Nerves.  Hormones.  Muscles.  Bones and teeth. When your calcium stores are low, you may be at risk for low bone mass, bone loss, and broken bones (fractures). When you get enough calcium, it helps to support strong bones and teeth throughout your life. Calcium is especially important for:  Children during growth spurts.  Girls during adolescence.  Women who are pregnant or  breastfeeding.  Women after their menstrual cycle stops (postmenopause).  Women whose menstrual cycle has stopped due to anorexia nervosa or regular intense exercise.  People who cannot eat or digest dairy products.  Vegans. What are tips for getting more calcium? General information  Try to get most of your calcium from food. Eat foods that are high in calcium.  Some people may benefit from taking calcium supplements. Check with your health care provider or diet and nutrition specialist (dietitian) before starting any calcium supplements. Calcium supplements may interact with certain medicines. Too much calcium may cause other health problems, like constipation and kidney stones.  For the body to absorb calcium, it needs vitamin D. Sources of vitamin D include: ? Skin exposure to direct sunlight. ? Foods, such as egg yolks, liver, saltwater fish, and fortified milk. ? Vitamin D supplements. Check with your health care provider or dietitian before starting any vitamin D supplements. What foods are high in calcium?  High-calcium foods are those that contain more than 100 milligrams (mg) of calcium per serving. Fruits  Fortified orange or other fruit juice, 300 mg per 8 oz serving. Vegetables  Collard greens, 360 mg per 8 oz serving.  Kale, 180 mg per 8 oz serving.  Bok choy, 160 mg per 8 oz serving. Grains  Fortified ready-to-eat cereals, 100-1,000 mg per 8 oz serving.  Fortified frozen waffles, 200 mg in two waffles. Meats and other proteins  Sardines, canned with bones, 325 mg per 3 oz  serving.  Salmon, canned with bones, 180 mg per 3 oz serving.  Canned shrimp, 125 mg per 3 oz serving.  Baked beans, 160 mg per 4 oz serving. Dairy  Yogurt, plain, low-fat, 310 mg per 6 oz serving.  Milk, 300 mg per 8 oz serving.  American cheese, 195 mg per 1 oz serving.  Cheddar cheese, 205 mg per 1 oz serving.  Cottage cheese 2%, 105 mg per 4 oz serving.  Fortified soy,  rice, or almond milk, 300 mg per 8 oz serving. The items listed above may not be a complete list of foods high in calcium. Actual amounts of calcium may be different depending on processing. Contact a dietitian for more information. What foods are lower in calcium? Foods lower in calcium are those that contain 50 mg of calcium or less per serving. Fruits  Apple, about 6 mg in one apple.  Banana, about 12 mg in one banana. Vegetables  Lettuce, 19 mg per 2 oz serving.  Tomato, about 11 mg in one tomato. Grains  Rice, 4 mg per 6 oz serving.  Boiled potatoes, 14 mg per 8 oz serving.  White bread, 6 mg in one slice. Meats and other proteins  Egg, 27 mg per 2 oz serving.  Red meat, 7 mg per 4 oz serving.  Chicken, 17 mg per 4 oz serving.  Fish, cod or trout, 20 mg per 4 oz serving. The items listed above may not be a complete list of foods lower in calcium. Actual amounts of calcium may be different depending on processing. Contact a dietitian for more information. Summary  Calcium is an important mineral in the body because it affects many functions. Getting enough calcium helps support strong bones and teeth throughout your life.  Try to get most of your calcium from food.  Calcium supplements may interact with certain medicines. Check with your health care provider before starting any calcium supplements. This information is not intended to replace advice given to you by your health care provider. Make sure you discuss any questions you have with your health care provider. Document Released: 09/24/2003 Document Revised: 02/02/2017 Document Reviewed: 02/02/2017 Elsevier Patient Education  [place].  Osteopenia  Osteopenia is a loss of thickness (density) inside of the bones. Another name for osteopenia is low bone mass. Mild osteopenia is a normal part of aging. It is not a disease, and it does not cause symptoms. However, if you have osteopenia and continue to lose  bone mass, you could develop a condition that causes the bones to become thin and break more easily (osteoporosis). You may also lose some height, have back pain, and have a stooped posture. Although osteopenia is not a disease, making changes to your lifestyle and diet can help to prevent osteopenia from developing into osteoporosis. What are the causes? Osteopenia is caused by loss of calcium in the bones.  Bones are constantly changing. Old bone cells are continually being replaced with new bone cells. This process builds new bone. The mineral calcium is needed to build new bone and maintain bone density. Bone density is usually highest around age 22. After that, most people's bodies cannot replace all the bone they have lost with new bone. What increases the risk? You are more likely to develop this condition if:  You are older than age 4.  You are a woman who went through menopause early.  You have a long illness that keeps you in bed.  You do not get  enough exercise.  You lack certain nutrients (malnutrition).  You have an overactive thyroid gland (hyperthyroidism).  You smoke.  You drink a lot of alcohol.  You are taking medicines that weaken the bones, such as steroids. What are the signs or symptoms? This condition does not cause any symptoms. You may have a slightly higher risk for bone breaks (fractures), so getting fractures more easily than normal may be an indication of osteopenia. How is this diagnosed? Your health care provider can diagnose this condition with a special type of X-ray exam that measures bone density (dual-energy X-ray absorptiometry, DEXA). This test can measure bone density in your hips, spine, and wrists. Osteopenia has no symptoms, so this condition is usually diagnosed after a routine bone density screening test is done for osteoporosis. This routine screening is usually done for:  Women who are age 63 or older.  Men who are age 77 or older. If  you have risk factors for osteopenia, you may have the screening test at an earlier age. How is this treated? Making dietary and lifestyle changes can lower your risk for osteoporosis. If you have severe osteopenia that is close to becoming osteoporosis, your health care provider may prescribe medicines and dietary supplements such as calcium and vitamin D. These supplements help to rebuild bone density. Follow these instructions at home:   Take over-the-counter and prescription medicines only as told by your health care provider. These include vitamins and supplements.  Eat a diet that is high in calcium and vitamin D. ? Calcium is found in dairy products, beans, salmon, and leafy green vegetables like spinach and broccoli. ? Look for foods that have vitamin D and calcium added to them (fortified foods), such as orange juice, cereal, and bread.  Do 30 or more minutes of a weight-bearing exercise every day, such as walking, jogging, or playing a sport. These types of exercises strengthen the bones.  Take precautions at home to lower your risk of falling, such as: ? Keeping rooms well-lit and free of clutter, such as cords. ? Installing safety rails on stairs. ? Using rubber mats in the bathroom or other areas that are often wet or slippery.  Do not use any products that contain nicotine or tobacco, such as cigarettes and e-cigarettes. If you need help quitting, ask your health care provider.  Avoid alcohol or limit alcohol intake to no more than 1 drink a day for nonpregnant women and 2 drinks a day for men. One drink equals 12 oz of beer, 5 oz of wine, or 1 oz of hard liquor.  Keep all follow-up visits as told by your health care provider. This is important. Contact a health care provider if:  You have not had a bone density screening for osteoporosis and you are: ? A woman, age 78 or older. ? A man, age 40 or older.  You are a postmenopausal woman who has not had a bone density  screening for osteoporosis.  You are older than age 68 and you want to know if you should have bone density screening for osteoporosis. Summary  Osteopenia is a loss of thickness (density) inside of the bones. Another name for osteopenia is low bone mass.  Osteopenia is not a disease, but it may increase your risk for a condition that causes the bones to become thin and break more easily (osteoporosis).  You may be at risk for osteopenia if you are older than age 7 or if you are a woman who went  through early menopause.  Osteopenia does not cause any symptoms, but it can be diagnosed with a bone density screening test.  Dietary and lifestyle changes are the first treatment for osteopenia. These may lower your risk for osteoporosis. This information is not intended to replace advice given to you by your health care provider. Make sure you discuss any questions you have with your health care provider. Document Released: 11/18/2016 Document Revised: 01/22/2017 Document Reviewed: 11/18/2016 Elsevier Patient Education  2020 [employer].

## 2018-12-16 LAB — VITAMIN D 25 HYDROXY (VIT D DEFICIENCY, FRACTURES): Vit D, 25-Hydroxy: 25.5 ng/mL — ABNORMAL LOW (ref 30.0–100.0)

## 2019-01-02 NOTE — Progress Notes (Unsigned)
         Event Monitor for 30 days Start date 11/16/2018: The patient's monitoring period was 11/16/2018 - 12/15/2018. Baseline sample showed Sinus Rhythm w/Artifact with a heart rate of 71.7 bpm. There were 4 critical and 9 stable events that occurred. The triggered events included occasional PVC. One symptom corelated with SVT @ 160/min.  There were 4 sustained events and longest lasted 9 hours and 10 min and shortest 4 hours and 23 minutes. Symptoms included fatigue and no symptom or accidental push.

## 2019-01-31 ENCOUNTER — Telehealth: Payer: Self-pay | Admitting: Family Medicine

## 2019-01-31 DIAGNOSIS — I471 Supraventricular tachycardia: Secondary | ICD-10-CM

## 2019-01-31 NOTE — Telephone Encounter (Signed)
Copied from [place] [id number]. Topic: General - Other >> Jan 31, 2019  1:38 PM Leward Quan A wrote: Reason for CRM: Patient called to inform Dr Pamella Pert that she is taking her medication but is still having issues with her heart palpating say that it happens in the wee hours in the morning and also that she is up urinating every 30 to 45 minutes at night. Per patient she is taking the medication as prescribed and usually after the second dose the palpitation slows down. She is asking for a call back at Ph# [phone]

## 2019-02-03 NOTE — Telephone Encounter (Signed)
Pt requesting c/b

## 2019-02-03 NOTE — Telephone Encounter (Signed)
I advise she reach out to her cardiologist regarding her palpitations. In regards her urine, I would like for her to come in to be evaluated. thanks

## 2019-02-06 ENCOUNTER — Telehealth: Payer: Self-pay | Admitting: Family Medicine

## 2019-02-06 NOTE — Telephone Encounter (Signed)
Is it ok to refill this Flonase. I do not see where anyone has prescribed this for the patient. Patient has an upcoming appt with you.

## 2019-02-06 NOTE — Telephone Encounter (Signed)
Patient requesting fluticasone (FLONASE) 50 MCG/ACT nasal spray, informed patient please allow 48 72 hour turn around   [place], [place]

## 2019-02-07 MED ORDER — FLUTICASONE PROPIONATE 50 MCG/ACT NA SUSP: 2.0000 | Freq: Every day | NASAL | 5 refills | Status: DC | PRN

## 2019-02-08 NOTE — Telephone Encounter (Signed)
I have called the pt and got an update on how she is doing. She stated that she is doing well and her palpations are better. She stated that she is only requesting the Flonase. I have informed her that it looks like that medication has been sent in on yesterday. Pt stated understanding and I have also informed her that if she is still having problems with the palpations that she should call [facility]. Pt stated that she does have an upcoming appointment with them.

## 2019-02-09 ENCOUNTER — Other Ambulatory Visit: Payer: Self-pay

## 2019-02-09 DIAGNOSIS — E782 Mixed hyperlipidemia: Secondary | ICD-10-CM

## 2019-02-09 MED ORDER — ATORVASTATIN CALCIUM 10 MG PO TABS: 10.0000 mg | ORAL_TABLET | Freq: Every day | ORAL | 3 refills | Status: DC

## 2019-02-15 ENCOUNTER — Ambulatory Visit (INDEPENDENT_AMBULATORY_CARE_PROVIDER_SITE_OTHER): Payer: PPO | Admitting: Cardiology

## 2019-02-15 ENCOUNTER — Other Ambulatory Visit: Payer: Self-pay

## 2019-02-15 ENCOUNTER — Encounter: Payer: Self-pay | Admitting: Cardiology

## 2019-02-15 VITALS — BP 128/75 | HR 80 | Temp 97.3°F | Ht 59.0 in | Wt 216.0 lb

## 2019-02-15 DIAGNOSIS — R9431 Abnormal electrocardiogram [ECG] [EKG]: Secondary | ICD-10-CM

## 2019-02-15 DIAGNOSIS — I10 Essential (primary) hypertension: Secondary | ICD-10-CM

## 2019-02-15 DIAGNOSIS — E782 Mixed hyperlipidemia: Secondary | ICD-10-CM

## 2019-02-15 DIAGNOSIS — I471 Supraventricular tachycardia, unspecified: Secondary | ICD-10-CM

## 2019-02-15 DIAGNOSIS — E7800 Pure hypercholesterolemia, unspecified: Secondary | ICD-10-CM

## 2019-02-15 DIAGNOSIS — R0982 Postnasal drip: Secondary | ICD-10-CM | POA: Diagnosis not present

## 2019-02-15 DIAGNOSIS — R0602 Shortness of breath: Secondary | ICD-10-CM | POA: Diagnosis not present

## 2019-02-15 MED ORDER — DILTIAZEM HCL ER COATED BEADS 240 MG PO CP24: 240.0000 mg | ORAL_CAPSULE | Freq: Every day | ORAL | 1 refills | Status: DC

## 2019-02-15 MED ORDER — OMEPRAZOLE 20 MG PO CPDR: 20.0000 mg | DELAYED_RELEASE_CAPSULE | Freq: Every day | ORAL | 2 refills | Status: DC

## 2019-02-15 NOTE — Progress Notes (Signed)
Primary Physician/Referring:  Rutherford Guys, MD  Patient ID: Gina Guerrero, female    DOB: 03-04-1940, 78 y.o.   MRN: [id number]  Chief Complaint  Patient presents with  . paroxysmal supraventricular tachycardia    pt c/o wheezing   HPI:    HPI: Gina Guerrero  is a 78 y.o. AAF patient with  medical history is significant for asthma, Hyperlipidemia, morbid obesity, seen in the emergency room on 08/26/2018 when she presented with shortness of breath and wheezing and was also found to have SVT with a heart rate up to 193 bpm.  She spontaneously converted to sinus rhythm after IV diltiazem.  She is now having frequent episodes of palpitations that lasts several hours and has also used diltiazem that she has taken p.r.n. basis for termination. Valsalva maneuver does help. She underwent event monitor and presents for follow-up.  Has noticed more frequent episodes of palpitations.  She also complains of dyspnea on exertion.  No PND or orthopnea.  Also has noticed postnasal drip at night.  Past Medical History:  Diagnosis Date  . Asthma   . Cataract   . Osteopenia determined by x-ray 11/2018   low FRAX score   Past Surgical History:  Procedure Laterality Date  . ABDOMINAL HYSTERECTOMY    . EYE SURGERY     Social History   Socioeconomic History  . Marital status: Married    Spouse name: Not on file  . Number of children: 0  . Years of education: Not on file  . Highest education level: Not on file  Occupational History  . Not on file  Tobacco Use  . Smoking status: Never Smoker  . Smokeless tobacco: Never Used  Substance and Sexual Activity  . Alcohol use: No  . Drug use: Never  . Sexual activity: Not on file  Other Topics Concern  . Not on file  Social History Narrative   Lives with husband and is currently taking care of her niece who is dying of brain cancer.     No children.   Social Determinants of Health   Financial Resource Strain:   . Difficulty of  Paying Living Expenses: Not on file  Food Insecurity:   . Worried About Charity fundraiser in the Last Year: Not on file  . Ran Out of Food in the Last Year: Not on file  Transportation Needs:   . Lack of Transportation (Medical): Not on file  . Lack of Transportation (Non-Medical): Not on file  Physical Activity:   . Days of Exercise per Week: Not on file  . Minutes of Exercise per Session: Not on file  Stress:   . Feeling of Stress : Not on file  Social Connections:   . Frequency of Communication with Friends and Family: Not on file  . Frequency of Social Gatherings with Friends and Family: Not on file  . Attends Religious Services: Not on file  . Active Member of Clubs or Organizations: Not on file  . Attends Archivist Meetings: Not on file  . Marital Status: Not on file  Intimate Partner Violence:   . Fear of Current or Ex-Partner: Not on file  . Emotionally Abused: Not on file  . Physically Abused: Not on file  . Sexually Abused: Not on file   ROS  Review of Systems  Constitution: Negative for chills, decreased appetite, malaise/fatigue and weight gain.  HENT: Negative for congestion.   Cardiovascular: Positive for palpitations. Negative for chest  pain, dyspnea on exertion, leg swelling and syncope.  Respiratory: Positive for cough (at night with post nasal drip). Negative for wheezing.   Endocrine: Negative for cold intolerance.  Hematologic/Lymphatic: Does not bruise/bleed easily.  Musculoskeletal: Negative for joint swelling.  Gastrointestinal: Negative for abdominal pain, anorexia, change in bowel habit, hematochezia and melena.  Neurological: Negative for excessive daytime sleepiness, headaches, light-headedness and weakness.  Psychiatric/Behavioral: Negative for depression and substance abuse.  All other systems reviewed and are negative.  Objective  Blood pressure 128/75, pulse 80, temperature (!) 97.3 F (36.3 C), height 4\' 11"  (1.499 m), weight 216  lb (98 kg), SpO2 97 %. Body mass index is 43.63 kg/m.   Vitals with BMI 02/15/2019 12/15/2018 11/16/2018  Height 4\' 11"  4\' 11"  4\' 11"   Weight 216 lbs 218 lbs 219 lbs 8 oz  BMI 43.6 0000000 0000000  Systolic 0000000 0000000 [id number]  Diastolic 75 75 77  Pulse 80 74 77    Physical Exam  Constitutional: She is oriented to person, place, and time.  Short stay chair, morbidly obese in no acute distress.  HENT:  Head: Normocephalic.  Eyes: Pupils are equal, round, and reactive to light.  Cardiovascular: Normal rate, regular rhythm, normal heart sounds, intact distal pulses and normal pulses. Exam reveals no gallop.  No murmur heard. Pulses:      Carotid pulses are 2+ on the right side and 2+ on the left side. No leg edema, no JVD.  Pulmonary/Chest: Effort normal and breath sounds normal. No respiratory distress. She has no wheezes. She exhibits no tenderness.  Abdominal: Soft. Bowel sounds are normal. There is no abdominal tenderness.  Musculoskeletal:        General: No edema. Normal range of motion.     Cervical back: Normal range of motion and neck supple.  Neurological: She is alert and oriented to person, place, and time.  Skin: Skin is warm and dry. No rash noted.  Psychiatric: She has a normal mood and affect. Her behavior is normal.   Radiology: No results found.  Laboratory examination:   CMP Latest Ref Rng & Units 09/13/2018 08/26/2018 03/26/2016  Glucose 65 - 99 mg/dL 98 148(H) 102(H)  BUN 8 - 27 mg/dL 15 47(H) 19  Creatinine 0.57 - 1.00 mg/dL 0.86 1.30(H) 0.78  Sodium 134 - 144 mmol/L 143 137 141  Potassium 3.5 - 5.2 mmol/L 4.9 4.1 3.8  Chloride 96 - 106 mmol/L 105 105 101  CO2 20 - 29 mmol/L 24 17(L) 24  Calcium 8.7 - 10.3 mg/dL 9.5 8.8(L) 9.1  Total Protein 6.0 - 8.5 g/dL - - 7.0  Total Bilirubin 0.0 - 1.2 mg/dL - - 0.4  Alkaline Phos 39 - 117 IU/L - - 87  AST 0 - 40 IU/L - - 20  ALT 0 - 32 IU/L - - 13   CBC Latest Ref Rng & Units 08/26/2018 03/26/2016 02/13/2016  WBC 4.0 - 10.5 K/uL  10.1 7.9 8.1  Hemoglobin 12.0 - 15.0 g/dL 14.1 14.2 14.1  Hematocrit 36.0 - 46.0 % 42.5 42.1 41.1  Platelets 150 - 400 K/uL 154 237 -   Lipid Panel     Component Value Date/Time   CHOL 216 (H) 11/18/2018 1104   TRIG 70 11/18/2018 1104   HDL 58 11/18/2018 1104   LDLCALC 146 (H) 11/18/2018 1104   HEMOGLOBIN A1C No results found for: HGBA1C, MPG TSH Recent Labs    08/26/18 1025  TSH 2.066   Medications   Current Outpatient Medications  Medication Instructions  . albuterol (VENTOLIN HFA) 108 (90 Base) MCG/ACT inhaler 2 puffs, Inhalation, Every 4 hours PRN  . aspirin EC 81 mg, Oral, Daily  . atorvastatin (LIPITOR) 10 mg, Oral, Daily  . Cholecalciferol (VITAMIN D) 50 MCG (2000 UT) CAPS Oral  . diclofenac sodium (VOLTAREN) 2 g, Topical, 4 times daily  . diltiazem (CARDIZEM CD) 240 mg, Oral, Daily  . diltiazem (CARDIZEM) 30 mg, Oral, 3 times daily, Take 2 additional tablets with heart palpitations  . Multiple Vitamins-Minerals (PRESERVISION AREDS 2 PO) 1 tablet, Oral, 2 times daily  . omeprazole (PRILOSEC) 20 mg, Oral, Daily before breakfast    Cardiac Studies:   Echocardiogram 10/14/2018: Left ventricle cavity is normal in size. Normal left ventricular wall thickness. Normal LV systolic function with EF 55%. Normal global wall motion. Doppler evidence of grade I (impaired) diastolic dysfunction, normal LAP.  Left atrial cavity is normal in size. Aneurysmal interatrial septum without 2D or color Doppler evidence of interatrial shunt. Trileaflet aortic valve. Mild aortic valve leaflet calcification. Mild (Grade I) aortic regurgitation. Mild tricuspid regurgitation. Estimated pulmonary artery systolic pressure is 26 mmHg.  Event Monitor for 30 days Start date 11/16/2018:  The patient's monitoring period was 11/16/2018 - 12/15/2018. Baseline sample showed Sinus Rhythm w/Artifact with a heart rate of 71.7 bpm. There were 4 critical and 9 stable events that occurred. The triggered  events included occasional PVC. One symptom corelated with SVT @ 160/min.  There were 4 sustained events and longest lasted 9 hours and 10 min and shortest 4 hours and 23 minutes. Symptoms included fatigue and no symptom or accidental push.  Assessment     ICD-10-CM   1. PSVT (paroxysmal supraventricular tachycardia) (HCC)  I47.1 EKG 12-Lead    diltiazem (CARDIZEM CD) 240 MG 24 hr capsule    Ambulatory referral to Cardiac Electrophysiology  2. Shortness of breath  R06.02 PCV MYOCARDIAL PERFUSION WITH LEXISCAN  3. Post-nasal drip  R09.82 omeprazole (PRILOSEC) 20 MG capsule  4. Nonspecific abnormal electrocardiogram (ECG) (EKG)  R94.31   5. Hypercholesteremia  E78.00   6. Primary hypertension  I10 diltiazem (CARDIZEM CD) 240 MG 24 hr capsule    EKG 02/15/2019: Normal sinus rhythm at rate of 76 bpm, left atrial enlargement, left axis deviation, left anterior fascicular block.  Prominent R in V1, consider RVH.  Poor R-wave progression, cannot exclude anteroseptal infarct old.  LVH.  Normal QT interval. No significant change from  EKG 09/14/2018.   EKG 08/26/2018: SVT at rate of 193 bpm, left axis deviation, incomplete right bundle branch block.  Nonspecific T abnormality. Repeat EKG reveals probable ectopic atrial rhythm with short PR interval, leftward axis, poor R-wave progression, incomplete right bundle branch block.  Diffuse nonspecific T abnormality, cannot exclude inferior and anterolateral ischemia.  ST T abnormalities may have been due to either demand ischemia or memory effect S/P SVT examination.  Recommendations:    Gina Guerrero  is a 78 y.o. AAF patient with  medical history is significant for asthma, Hyperlipidemia, morbid obesity, seen in the emergency room on 08/26/2018 when she presented with shortness of breath and wheezing and was also found to have SVT with a heart rate up to 193 bpm.  She spontaneously converted to sinus rhythm after IV diltiazem.  She probably has  essential hypertension, blood pressure continues to remain in 150 range.  I have increased diltiazem CD from 120 mg 240 mg daily in view of increased frequency of palpitations and also hypertension.  She is  now tolerating Lipitor started for hyperlipidemia.  We'll check lipid status. For postnasal drip I advised her to sleep reclined and also try PPI.  To discuss further with PCP.  Weight loss was discussed.  Patient had markedly abnormal EKG with ST depression noted with SVT, initially I had not plan on performing stress test to evaluate her dyspnea however in view of hyperlipidemia, hypertension, and abnormal EKG, we will obtain Lexiscan Myoview stress test.  Patient unable to exercise due to dyspnea.  I'll also refer her to be evaluated by Dr. Cristopher Peru, EP for evaluation for need for SVT ablation. ( Event monitor report is the CV studies as attachment)  Adrian Prows, MD, [facility] 02/15/2019, 12:45 PM [facility] [place] Pager: [phone] Office: [phone] If no answer Cell [phone]  Ml: Cristopher Peru, MD (EP)

## 2019-02-22 ENCOUNTER — Other Ambulatory Visit: Payer: PPO

## 2019-03-02 ENCOUNTER — Encounter: Payer: Self-pay | Admitting: Allergy & Immunology

## 2019-03-02 ENCOUNTER — Ambulatory Visit (INDEPENDENT_AMBULATORY_CARE_PROVIDER_SITE_OTHER): Payer: PPO | Admitting: Allergy & Immunology

## 2019-03-02 ENCOUNTER — Other Ambulatory Visit: Payer: Self-pay

## 2019-03-02 VITALS — BP 124/70 | HR 78 | Temp 97.9°F | Resp 18 | Ht 59.5 in | Wt 211.0 lb

## 2019-03-02 DIAGNOSIS — J4520 Mild intermittent asthma, uncomplicated: Secondary | ICD-10-CM | POA: Diagnosis not present

## 2019-03-02 DIAGNOSIS — J3089 Other allergic rhinitis: Secondary | ICD-10-CM | POA: Diagnosis not present

## 2019-03-02 MED ORDER — AZELASTINE HCL 0.1 % NA SOLN: 2.0000 | Freq: Two times a day (BID) | NASAL | 5 refills | Status: AC | PRN

## 2019-03-02 NOTE — Patient Instructions (Addendum)
1. Shortness of breath - Lung testing looks great today. - We will continue with albuterol two puffs every 4-6 hours as needed.  - We will hold off on a daily controller medication.   2. Chronic rhinitis (dust mites, cat) - Stop the fluticasone.  - Add on Astelin one spray per nostril up to twice daily as needed.  - Continue with nasal saline rinses.    3. Return in about 1 year (around 03/01/2020). This can be an in-person, a virtual Webex or a telephone follow up visit.   Please inform us of any Emergency Department visits, hospitalizations, or changes in symptoms. Call us before going to the ED for breathing or allergy symptoms since we might be able to fit you in for a sick visit. Feel free to contact us anytime with any questions, problems, or concerns.  It was a pleasure to see you again today!  Websites that have reliable patient information: 1. American Academy of Asthma, Allergy, and Immunology: www.aaaai.org 2. Food Allergy Research and Education (FARE): foodallergy.org 3. Mothers of Asthmatics: http://www.asthmacommunitynetwork.org 4. American College of Allergy, Asthma, and Immunology: www.acaai.org  "Like" [place] on Facebook and Instagram for our latest updates!        Make sure you are registered to vote! If you have moved or changed any of your contact information, you will need to get this updated before voting!  In some cases, you MAY be able to register to vote online: [email or web address]

## 2019-03-02 NOTE — Progress Notes (Signed)
FOLLOW UP  Date of Service/Encounter:  03/02/19   Assessment:    Moderate persistent asthma- apparently controlled since the last visit two years ago  Shortness of breath - with elevated BNP and mildly elevated troponins on a recent ED visit  Allergic rhinitis (dust mite, cat)  Adverse food reaction(shellfish)  Needs a Primary Care Provider   Asthma Reportables: Severity:moderate persistent Risk:low Control:not well controlled     Plan/Recommendations:   1. Shortness of breath - Lung testing looks great today. - We will continue with albuterol two puffs every 4-6 hours as needed.  - We will hold off on a daily controller medication.   2. Chronic rhinitis (dust mites, cat) - Stop the fluticasone.  - Add on Astelin one spray per nostril up to twice daily as needed.  - Continue with nasal saline rinses.    3. Return in about 1 year (around 03/01/2020). This can be an in-person, a virtual Webex or a telephone follow up visit.  Subjective:   Gina Guerrero is a 79 y.o. female presenting today for follow up of  Chief Complaint  Patient presents with  . Asthma    doing much better. still some lingering fatigue. she does have the white caked up stuff on her tongue like she had last time. she would like to know what you think this is.     Gina Guerrero has a history of the following: Patient Active Problem List   Diagnosis Date Noted  . Osteopenia determined by x-ray 11/2018  . SVT (supraventricular tachycardia) ([place]) 09/14/2018  . Shortness of breath 08/30/2018  . Elevated brain natriuretic peptide (BNP) level 08/30/2018  . Perennial allergic rhinitis 08/30/2018  . Chronic cough 05/13/2016    History obtained from: chart review and patient.  Gina Guerrero is a 79 y.o. female presenting for a follow up visit.  She was last seen in July 2020 for a sick visit for shortness of breath.  She did have an elevated BNP, indicative of a congestive heart  failure exacerbation.  We did refer her to see cardiologist.  For her rhinitis, we continue with Flonase.    Since the last visit, she did well. She did go to see Cardiology and she had a full workup that was largely normal. She was wheezing and had shortness of breath. A heart monitor for a whole month was largely normal. Echocardiogram was normal as well. She has not wheezed in around two weeks.   She does complain of some eye soreness. She called her eye doctor and she was told to use lubricating eye drops four times daily. She is reporting that she is developing whiteness on the tongue. Scraping it does remove it, but it appears again in the morning.  Asthma/Respiratory Symptom History: She was on Breo and Advair at one point but she is no longer using these at all. She is no longer using a controller medication. She has not used her alubterol in 2.5 weeks. She does not feel that she needs the controller medication is needed at this point with her lack of symptoms.   Allergic Rhinitis Symptom History: She is no longer taking fluticasone at all. She does report some nasal discharge, but it is not enough for her to need to use anything. She has not needed antibiotics at all since the last visit.  She is on her reflux medication and she takes it once in the morning before breakfast. She has been working out more and is working  on losing weight.   Otherwise, there have been no changes to her past medical history, surgical history, family history, or social history.    Review of Systems  Constitutional: Negative.  Negative for chills, fever, malaise/fatigue and weight loss.  HENT: Negative.  Negative for congestion, ear discharge, ear pain and sore throat.   Eyes: Negative for pain, discharge and redness.  Respiratory: Negative for cough, sputum production, shortness of breath and wheezing.   Cardiovascular: Negative.  Negative for chest pain and palpitations.  Gastrointestinal: Negative for  abdominal pain, constipation, diarrhea, heartburn, nausea and vomiting.  Skin: Negative.  Negative for itching and rash.  Neurological: Negative for dizziness and headaches.  Endo/Heme/Allergies: Negative for environmental allergies. Does not bruise/bleed easily.       Objective:   Blood pressure 124/70, pulse 78, temperature 97.9 F (36.6 C), temperature source Temporal, resp. rate 18, height 4' 11.5" (1.511 m), weight 211 lb (95.7 kg), SpO2 97 %. Body mass index is 41.9 kg/m.   Physical Exam:  Physical Exam  Constitutional: She appears well-developed.  Pleasant female.   HENT:  Head: Normocephalic and atraumatic.  Right Ear: Tympanic membrane, external ear and ear canal normal.  Left Ear: Tympanic membrane, external ear and ear canal normal.  Nose: Mucosal edema and rhinorrhea present. No nasal deformity or septal deviation. No epistaxis. Right sinus exhibits no maxillary sinus tenderness and no frontal sinus tenderness. Left sinus exhibits no maxillary sinus tenderness and no frontal sinus tenderness.  Mouth/Throat: Uvula is midline and oropharynx is clear and moist. Mucous membranes are not pale and not dry.  Eyes: Pupils are equal, round, and reactive to light. Conjunctivae and EOM are normal. Right eye exhibits no chemosis and no discharge. Left eye exhibits no chemosis and no discharge. Right conjunctiva is not injected. Left conjunctiva is not injected.  Cardiovascular: Normal rate, regular rhythm and normal heart sounds.  Respiratory: Effort normal and breath sounds normal. No accessory muscle usage. No tachypnea. No respiratory distress. She has no wheezes. She has no rhonchi. She has no rales. She exhibits no tenderness.  Moving air well in all lung fields. No increased work of breathing noted.   Lymphadenopathy:    She has no cervical adenopathy.  Neurological: She is alert.  Skin: No abrasion, no petechiae and no rash noted. Rash is not papular, not vesicular and not  urticarial. No erythema. No pallor.  She does have bruising noted.   Psychiatric: She has a normal mood and affect.     Diagnostic studies:   Spirometry: results normal (FEV1: 1.80/143%, FVC: 2.12/128%, FEV1/FVC: 85%).    Spirometry consistent with normal pattern.   Allergy Studies: none       Salvatore Marvel, MD  [facility] [place] [facility]

## 2019-03-06 ENCOUNTER — Ambulatory Visit
Admission: RE | Admit: 2019-03-06 | Discharge: 2019-03-06 | Disposition: A | Discharge status: Home / Self Care | Payer: PPO | Source: Ambulatory Visit | Attending: Family Medicine | Admitting: Family Medicine

## 2019-03-06 ENCOUNTER — Other Ambulatory Visit: Payer: Self-pay

## 2019-03-06 DIAGNOSIS — Z1231 Encounter for screening mammogram for malignant neoplasm of breast: Secondary | ICD-10-CM

## 2019-03-07 ENCOUNTER — Encounter: Payer: Self-pay | Admitting: Internal Medicine

## 2019-03-07 ENCOUNTER — Ambulatory Visit (INDEPENDENT_AMBULATORY_CARE_PROVIDER_SITE_OTHER): Payer: PPO | Admitting: Internal Medicine

## 2019-03-07 DIAGNOSIS — R002 Palpitations: Secondary | ICD-10-CM | POA: Diagnosis not present

## 2019-03-07 DIAGNOSIS — I471 Supraventricular tachycardia: Secondary | ICD-10-CM | POA: Diagnosis not present

## 2019-03-07 DIAGNOSIS — E7800 Pure hypercholesterolemia, unspecified: Secondary | ICD-10-CM | POA: Diagnosis not present

## 2019-03-07 NOTE — Progress Notes (Signed)
HPI Gina Guerrero is referred by Dr. Einar Gip for evaluation of SVT. She is a pleasant morbidly obese 79 yo woman with HTN who was found to have SVT in July. She has had several episodes since then. She wore a cardiac monitor and has 12 lead ECG's showing SVT around 195/min. She has been treated with cardizem with improvement. She also has HTN and this helps. She has not had syncope. No Known Allergies   Current Outpatient Medications  Medication Sig Dispense Refill  . albuterol (VENTOLIN HFA) 108 (90 Base) MCG/ACT inhaler Inhale 2 puffs into the lungs every 4 (four) hours as needed for wheezing or shortness of breath (cough, shortness of breath or wheezing.). 6.7 g 1  . aspirin EC 81 MG tablet Take 81 mg by mouth daily.    Marland Kitchen atorvastatin (LIPITOR) 10 MG tablet Take 1 tablet (10 mg total) by mouth daily. 30 tablet 3  . azelastine (ASTELIN) 0.1 % nasal spray Place 2 sprays into both nostrils 2 (two) times daily as needed for rhinitis. 30 mL 5  . Cholecalciferol (VITAMIN D) 50 MCG (2000 UT) CAPS Take by mouth.    . diltiazem (CARDIZEM CD) 240 MG 24 hr capsule Take 1 capsule (240 mg total) by mouth daily. 90 capsule 1  . Multiple Vitamins-Minerals (PRESERVISION AREDS 2 PO) Take 1 tablet by mouth 2 (two) times daily.    Marland Kitchen omeprazole (PRILOSEC) 20 MG capsule Take 1 capsule (20 mg total) by mouth daily before breakfast. 30 capsule 2   No current facility-administered medications for this visit.     Past Medical History:  Diagnosis Date  . Asthma   . Cataract   . Osteopenia determined by x-ray 11/2018   low FRAX score    ROS:   All systems reviewed and negative except as noted in the HPI.   Past Surgical History:  Procedure Laterality Date  . ABDOMINAL HYSTERECTOMY    . EYE SURGERY       Family History  Problem Relation Age of Onset  . Heart disease Sister   . Allergic rhinitis Neg Hx   . Angioedema Neg Hx   . Asthma Neg Hx   . Eczema Neg Hx   . Immunodeficiency Neg Hx   .  Urticaria Neg Hx      Social History   Socioeconomic History  . Marital status: Married    Spouse name: Not on file  . Number of children: 0  . Years of education: Not on file  . Highest education level: Not on file  Occupational History  . Not on file  Tobacco Use  . Smoking status: Never Smoker  . Smokeless tobacco: Never Used  Substance and Sexual Activity  . Alcohol use: No  . Drug use: Never  . Sexual activity: Not on file  Other Topics Concern  . Not on file  Social History Narrative   Lives with husband and is currently taking care of her niece who is dying of brain cancer.     No children.   Social Determinants of Health   Financial Resource Strain:   . Difficulty of Paying Living Expenses: Not on file  Food Insecurity:   . Worried About Charity fundraiser in the Last Year: Not on file  . Ran Out of Food in the Last Year: Not on file  Transportation Needs:   . Lack of Transportation (Medical): Not on file  . Lack of Transportation (Non-Medical): Not on file  Physical Activity:   . Days of Exercise per Week: Not on file  . Minutes of Exercise per Session: Not on file  Stress:   . Feeling of Stress : Not on file  Social Connections:   . Frequency of Communication with Friends and Family: Not on file  . Frequency of Social Gatherings with Friends and Family: Not on file  . Attends Religious Services: Not on file  . Active Member of Clubs or Organizations: Not on file  . Attends Archivist Meetings: Not on file  . Marital Status: Not on file  Intimate Partner Violence:   . Fear of Current or Ex-Partner: Not on file  . Emotionally Abused: Not on file  . Physically Abused: Not on file  . Sexually Abused: Not on file     BP 136/80   Pulse 93   Ht 4' 11.5" (1.511 m)   Wt 213 lb (96.6 kg)   SpO2 99%   BMI 42.30 kg/m   Physical Exam:  Well appearing NAD HEENT: Unremarkable Neck:  No JVD, no thyromegally Lymphatics:  No adenopathy Back:   No CVA tenderness Lungs:  Clear HEART:  Regular rate rhythm, no murmurs, no rubs, no clicks Abd:  soft, positive bowel sounds, no organomegally, no rebound, no guarding Ext:  2 plus pulses, no edema, no cyanosis, no clubbing Skin:  No rashes no nodules Neuro:  CN II through XII intact, motor grossly intact  EKG - reviewed. NSR with no pre-excitation  Assess/Plan: 1. SVT - I have discussed the treatment options in detail. While she is a candidate for catheter ablation, she is not sure she is symptomatic enough and she would like to undergo watchful waiting. If her symptoms worsen, or she becomes intolerant of her meds, then she will call us and we will schedule catheter ablation which I have reviewed with her in detail. I encouraged her to avoid caffeine and ETOH.  2. HTN - she has asked about getting off of her cardizem. I think as long as she is overweight she will require this in addition for bp control.  3. Obesity - I encouraged her to lose weight.  4. Disp. - she will followup with me as needed or if she would like to discuss/proceed with catheter ablation.  Mikle Bosworth.D.

## 2019-03-07 NOTE — Patient Instructions (Signed)
Medication Instructions:  Your physician recommends that you continue on your current medications as directed. Please refer to the Current Medication list given to you today.  Labwork: None ordered.  Testing/Procedures: None ordered.  Follow-Up: Your physician wants you to follow-up in: as needed with Dr. Taylor.      Any Other Special Instructions Will Be Listed Below (If Applicable).  If you need a refill on your cardiac medications before your next appointment, please call your pharmacy.   

## 2019-03-08 ENCOUNTER — Telehealth: Payer: Self-pay

## 2019-03-08 ENCOUNTER — Other Ambulatory Visit: Payer: PPO

## 2019-03-08 LAB — LIPID PANEL WITH LDL/HDL RATIO
Cholesterol, Total: 149 mg/dL (ref 100–199)
HDL: 58 mg/dL (ref >39)
LDL Chol Calc (NIH): 78 mg/dL (ref 0–99)
LDL/HDL Ratio: 1.3 ratio (ref 0.0–3.2)
Triglycerides: 65 mg/dL (ref 0–149)
VLDL Cholesterol Cal: 13 mg/dL (ref 5–40)

## 2019-03-08 NOTE — Progress Notes (Signed)
Lipids normal .The current medical regimen is effective;  continue present plan and medications. Present medications

## 2019-03-08 NOTE — Progress Notes (Signed)
Let patient know.

## 2019-03-09 ENCOUNTER — Institutional Professional Consult (permissible substitution): Payer: PPO | Admitting: Internal Medicine

## 2019-03-15 ENCOUNTER — Ambulatory Visit: Payer: PPO | Attending: Internal Medicine

## 2019-03-15 DIAGNOSIS — Z23 Encounter for immunization: Secondary | ICD-10-CM | POA: Insufficient documentation

## 2019-03-15 NOTE — Progress Notes (Signed)
   Covid-19 Vaccination Clinic  Name:  Resa Anliker    MRN: [id number] DOB: Mar 15, 1940  03/15/2019  Ms. Marra was observed post Covid-19 immunization for 15 minutes without incidence. She was provided with Vaccine Information Sheet and instruction to access the V-Safe system.   Ms. Trudgeon was instructed to call 911 with any severe reactions post vaccine: Marland Kitchen Difficulty breathing  . Swelling of your face and throat  . A fast heartbeat  . A bad rash all over your body  . Dizziness and weakness    Immunizations Administered    Name Date Dose VIS Date Route   Pfizer COVID-19 Vaccine 03/15/2019  5:13 PM 0.3 mL 02/03/2019 Intramuscular   Manufacturer: [employer]   Lot: [id number]   [place]: [id number]

## 2019-04-04 DIAGNOSIS — H524 Presbyopia: Secondary | ICD-10-CM | POA: Diagnosis not present

## 2019-04-04 DIAGNOSIS — H04123 Dry eye syndrome of bilateral lacrimal glands: Secondary | ICD-10-CM | POA: Diagnosis not present

## 2019-04-04 DIAGNOSIS — H353131 Nonexudative age-related macular degeneration, bilateral, early dry stage: Secondary | ICD-10-CM | POA: Diagnosis not present

## 2019-04-04 DIAGNOSIS — H52223 Regular astigmatism, bilateral: Secondary | ICD-10-CM | POA: Diagnosis not present

## 2019-04-05 ENCOUNTER — Ambulatory Visit: Payer: PPO | Attending: Internal Medicine

## 2019-04-05 DIAGNOSIS — Z23 Encounter for immunization: Secondary | ICD-10-CM

## 2019-04-05 NOTE — Progress Notes (Signed)
   Covid-19 Vaccination Clinic  Name:  Gina Guerrero    MRN: [id number] DOB: May 20, 1940  04/05/2019  [name] was observed post Covid-19 immunization for 15 minutes without incidence. She was provided with Vaccine Information Sheet and instruction to access the V-Safe system.   [name] was instructed to call 911 with any severe reactions post vaccine: Marland Kitchen Difficulty breathing  . Swelling of your face and throat  . A fast heartbeat  . A bad rash all over your body  . Dizziness and weakness    Immunizations Administered    Name Date Dose VIS Date Route   Pfizer COVID-19 Vaccine 04/05/2019 11:11 AM 0.3 mL 02/03/2019 Intramuscular   Manufacturer: [place]   Lot: [id number]   [place]: [id number]

## 2019-05-04 NOTE — Telephone Encounter (Signed)
error 

## 2019-05-10 ENCOUNTER — Other Ambulatory Visit: Payer: Self-pay | Admitting: Cardiology

## 2019-05-10 DIAGNOSIS — I471 Supraventricular tachycardia: Secondary | ICD-10-CM

## 2019-05-10 DIAGNOSIS — E782 Mixed hyperlipidemia: Secondary | ICD-10-CM

## 2019-05-13 ENCOUNTER — Other Ambulatory Visit: Payer: Self-pay | Admitting: Cardiology

## 2019-05-13 DIAGNOSIS — R0982 Postnasal drip: Secondary | ICD-10-CM

## 2019-05-17 ENCOUNTER — Other Ambulatory Visit: Payer: Self-pay

## 2019-05-17 ENCOUNTER — Ambulatory Visit: Payer: PPO

## 2019-05-17 DIAGNOSIS — R0602 Shortness of breath: Secondary | ICD-10-CM | POA: Diagnosis not present

## 2019-05-19 ENCOUNTER — Encounter: Payer: Self-pay | Admitting: Cardiology

## 2019-05-19 ENCOUNTER — Ambulatory Visit: Payer: PPO | Admitting: Cardiology

## 2019-05-19 ENCOUNTER — Other Ambulatory Visit: Payer: Self-pay

## 2019-05-19 VITALS — BP 128/70 | HR 86 | Temp 98.7°F | Ht 59.5 in | Wt 209.7 lb

## 2019-05-19 DIAGNOSIS — I471 Supraventricular tachycardia, unspecified: Secondary | ICD-10-CM

## 2019-05-19 DIAGNOSIS — E66813 Obesity, class 3: Secondary | ICD-10-CM

## 2019-05-19 DIAGNOSIS — I10 Essential (primary) hypertension: Secondary | ICD-10-CM

## 2019-05-19 DIAGNOSIS — E7800 Pure hypercholesterolemia, unspecified: Secondary | ICD-10-CM

## 2019-05-19 DIAGNOSIS — Z6841 Body Mass Index (BMI) 40.0 and over, adult: Secondary | ICD-10-CM | POA: Diagnosis not present

## 2019-05-19 NOTE — Progress Notes (Addendum)
Primary Physician/Referring:  Rutherford Guys, MD  Patient ID: Gina Guerrero, female    DOB: [birth year], 79 y.o.   MRN: [id number]  Chief Complaint  Patient presents with  . Hypertension  . Hyperlipidemia  . Follow-up   HPI:    HPI: Gina Guerrero  is a 79 y.o. AAF patient with  medical history is significant for asthma, Hyperlipidemia, morbid obesity, seen in the emergency room on 08/26/2018 when she presented with shortness of breath and wheezing and was also found to have SVT with a heart rate up to 193 bpm.  She spontaneously converted to sinus rhythm after IV diltiazem.  She is now having frequent episodes of palpitations that lasts several hours and has also used diltiazem that she has taken p.r.n. basis for termination. Valsalva maneuver does help. She underwent event monitor and presents for follow-up.  Has noticed more frequent episodes of palpitations.  She also complains of dyspnea on exertion.  No PND or orthopnea.  Also has noticed postnasal drip at night.  Past Medical History:  Diagnosis Date  . Asthma   . Cataract   . Osteopenia determined by x-ray 11/2018   low FRAX score   Past Surgical History:  Procedure Laterality Date  . ABDOMINAL HYSTERECTOMY    . EYE SURGERY     Social History   Tobacco Use  . Smoking status: Never Smoker  . Smokeless tobacco: Never Used  Substance Use Topics  . Alcohol use: No    ROS  Review of Systems  Cardiovascular: Positive for palpitations. Negative for chest pain, dyspnea on exertion and leg swelling.  Gastrointestinal: Negative for melena.   Objective  Blood pressure 128/70, pulse 86, temperature 98.7 F (37.1 C), height 4' 11.5" (1.511 m), weight 209 lb 11.2 oz (95.1 kg), SpO2 99 %. Body mass index is 41.65 kg/m.   Vitals with BMI 05/19/2019 03/07/2019 03/02/2019  Height 4' 11.5" 4' 11.5" 4' 11.5"  Weight 209 lbs 11 oz 213 lbs 211 lbs  BMI 41.66 0000000 [id number]  Systolic 0000000 [id number]  Diastolic 70 80 70  Pulse 86  93 78    Physical Exam  Cardiovascular: Normal rate, regular rhythm, normal heart sounds and intact distal pulses. Exam reveals no gallop.  No murmur heard. No leg edema, no JVD.  Pulmonary/Chest: Effort normal and breath sounds normal.  Abdominal: Soft. Bowel sounds are normal.   Radiology: No results found.  Laboratory examination:   CMP Latest Ref Rng & Units 09/13/2018 08/26/2018 03/26/2016  Glucose 65 - 99 mg/dL 98 148(H) 102(H)  BUN 8 - 27 mg/dL 15 47(H) 19  Creatinine 0.57 - 1.00 mg/dL 0.86 1.30(H) 0.78  Sodium 134 - 144 mmol/L 143 137 141  Potassium 3.5 - 5.2 mmol/L 4.9 4.1 3.8  Chloride 96 - 106 mmol/L 105 105 101  CO2 20 - 29 mmol/L 24 17(L) 24  Calcium 8.7 - 10.3 mg/dL 9.5 8.8(L) 9.1  Total Protein 6.0 - 8.5 g/dL - - 7.0  Total Bilirubin 0.0 - 1.2 mg/dL - - 0.4  Alkaline Phos 39 - 117 IU/L - - 87  AST 0 - 40 IU/L - - 20  ALT 0 - 32 IU/L - - 13   CBC Latest Ref Rng & Units 08/26/2018 03/26/2016 02/13/2016  WBC 4.0 - 10.5 K/uL 10.1 7.9 8.1  Hemoglobin 12.0 - 15.0 g/dL 14.1 14.2 14.1  Hematocrit 36.0 - 46.0 % 42.5 42.1 41.1  Platelets 150 - 400 K/uL 154 237 -  Lipid Panel     Component Value Date/Time   CHOL 149 03/07/2019 0935   TRIG 65 03/07/2019 0935   HDL 58 03/07/2019 0935   LDLCALC 78 03/07/2019 0935   HEMOGLOBIN A1C No results found for: HGBA1C, MPG TSH Recent Labs    08/26/18 1025  TSH 2.066   Medications   Current Outpatient Medications  Medication Instructions  . albuterol (VENTOLIN HFA) 108 (90 Base) MCG/ACT inhaler 2 puffs, Inhalation, Every 4 hours PRN  . aspirin EC 81 mg, Oral, Daily  . atorvastatin (LIPITOR) 10 MG tablet TAKE 1 TABLET(10 MG) BY MOUTH DAILY  . azelastine (ASTELIN) 0.1 % nasal spray 2 sprays, Each Nare, 2 times daily PRN  . Cholecalciferol (VITAMIN D) 50 MCG (2000 UT) CAPS Oral  . diltiazem (CARDIZEM CD) 240 mg, Oral, Daily  . Multiple Vitamins-Minerals (PRESERVISION AREDS 2 PO) 1 tablet, Oral, 2 times daily  . omeprazole  (PRILOSEC) 20 MG capsule TAKE 1 CAPSULE(20 MG) BY MOUTH DAILY BEFORE BREAKFAST    Cardiac Studies:   Echocardiogram 10/14/2018: Left ventricle cavity is normal in size. Normal left ventricular wall thickness. Normal LV systolic function with EF 55%. Normal global wall motion. Doppler evidence of grade I (impaired) diastolic dysfunction, normal LAP.  Left atrial cavity is normal in size. Aneurysmal interatrial septum without 2D or color Doppler evidence of interatrial shunt. Trileaflet aortic valve. Mild aortic valve leaflet calcification. Mild (Grade I) aortic regurgitation. Mild tricuspid regurgitation. Estimated pulmonary artery systolic pressure is 26 mmHg.  Event Monitor for 30 days Start date 11/16/2018:  The patient's monitoring period was 11/16/2018 - 12/15/2018. Baseline sample showed Sinus Rhythm w/Artifact with a heart rate of 71.7 bpm. There were 4 critical and 9 stable events that occurred. The triggered events included occasional PVC. One symptom corelated with SVT @ 160/min.  There were 4 sustained events and longest lasted 9 hours and 10 min and shortest 4 hours and 23 minutes. Symptoms included fatigue and no symptom or accidental push.  Lexiscan Tetrofosmin Stress Test  05/20/2019: Nondiagnostic ECG stress. Mild degree small extent perfusion defect consistent with breast attenuation in the inferior wall. A small sized mild ischemia cannot be completely excluded.  Stress LV EF: 50%.  No previous exam available for comparison. Low risk study.   Assessment     ICD-10-CM   1. PSVT (paroxysmal supraventricular tachycardia) (HCC)  I47.1   2. Primary hypertension  I10   3. Hypercholesteremia  E78.00   4. Class 3 severe obesity due to excess calories with serious comorbidity and body mass index (BMI) of 40.0 to 44.9 in adult [facility])  E66.01    Z68.41     EKG 02/15/2019: Normal sinus rhythm at rate of 76 bpm, left atrial enlargement, left axis deviation, left anterior fascicular  block.  Prominent R in V1, consider RVH.  Poor R-wave progression, cannot exclude anteroseptal infarct old.  LVH.  Normal QT interval. No significant change from  EKG 09/14/2018.   EKG 08/26/2018: SVT at rate of 193 bpm, left axis deviation, incomplete right bundle branch block.  Nonspecific T abnormality. Repeat EKG reveals probable ectopic atrial rhythm with short PR interval, leftward axis, poor R-wave progression, incomplete right bundle branch block.  Diffuse nonspecific T abnormality, cannot exclude inferior and anterolateral ischemia.  ST T abnormalities may have been due to either demand ischemia or memory effect S/P SVT examination.  Recommendations:    Gina Guerrero  is a 79 y.o. AAF patient with  medical history is significant for  asthma, Hyperlipidemia, morbid obesity, PSVT, hypertension presents for f/u after EP evaluation by Isidor Holts, MD in Jan 2021. Patient preferred medical management.  Labs reviewed, lipids well controlled. BP is improved with increasing Dilt to 240 mg.  She remains minimally symptomatic with palpitations which has improved with Dilt. Stress test is low risk. Reviewed.   Weight loss discussed. Positive reinforcement given. I will see her in 6 months for f/u.    Adrian Prows, MD, [facility] 05/19/2019, 12:05 PM [facility]. [place] Office: [phone]

## 2019-06-15 ENCOUNTER — Ambulatory Visit (INDEPENDENT_AMBULATORY_CARE_PROVIDER_SITE_OTHER): Payer: PPO | Admitting: Family Medicine

## 2019-06-15 ENCOUNTER — Other Ambulatory Visit: Payer: Self-pay

## 2019-06-15 ENCOUNTER — Encounter: Payer: Self-pay | Admitting: Family Medicine

## 2019-06-15 VITALS — BP 106/71 | HR 77 | Temp 97.6°F | Ht 59.5 in | Wt 208.0 lb

## 2019-06-15 DIAGNOSIS — I471 Supraventricular tachycardia: Secondary | ICD-10-CM

## 2019-06-15 DIAGNOSIS — E559 Vitamin D deficiency, unspecified: Secondary | ICD-10-CM

## 2019-06-15 DIAGNOSIS — R351 Nocturia: Secondary | ICD-10-CM

## 2019-06-15 DIAGNOSIS — L600 Ingrowing nail: Secondary | ICD-10-CM

## 2019-06-15 DIAGNOSIS — E7800 Pure hypercholesterolemia, unspecified: Secondary | ICD-10-CM | POA: Diagnosis not present

## 2019-06-15 DIAGNOSIS — L659 Nonscarring hair loss, unspecified: Secondary | ICD-10-CM | POA: Diagnosis not present

## 2019-06-15 MED ORDER — TRIAMCINOLONE ACETONIDE 0.1 % EX CREA: 1.0000 "application " | TOPICAL_CREAM | Freq: Two times a day (BID) | CUTANEOUS | 0 refills | Status: DC | PRN

## 2019-06-15 NOTE — Progress Notes (Signed)
4/22/202111:00 AM  Gina Guerrero [birth year], 79 y.o., female [id number]  Chief Complaint  Patient presents with  . Joint Swelling    L ankle, itchy and soreness- chronic issue  . Alopecia    HPI:   Patient is a 79 y.o. female with past medical history significant for pSVT, HLP, osteopenia and vitamin D deficiency who presents today with several concerns  psVT and HLP managed by cards - last OV jan 2021  Has been taking vitamin D 3 2000 units daily, takes calcium supplement  Having right ankle swelling, very itchy, sometimes gets red, not painful, known varicose veins  Has been having nocturia x 5-6, no dysuria, no intecontinence, no hematuria, no daytime problems Drinks lots of water at night Does not snore Able to got back to sleep and most times wakes up rested  Has concerns about hair loss Most hair loss is crown Has been using clobetasol lotion given to her by a friend and she feels it has been helping Her father and maternal aunt have baldness  Patient requesting referral to a different podiatrist, currently seen at [facility] She has recurring ingrown right greater toenail despite multiple procedures/nail removals    Lab Results  Component Value Date   CHOL 149 03/07/2019   HDL 58 03/07/2019   LDLCALC 78 03/07/2019   TRIG 65 03/07/2019   Lab Results  Component Value Date   CREATININE 0.86 09/13/2018   BUN 15 09/13/2018   NA 143 09/13/2018   K 4.9 09/13/2018   CL 105 09/13/2018   CO2 24 09/13/2018   Lab Results  Component Value Date   ALT 13 03/26/2016   AST 20 03/26/2016   ALKPHOS 87 03/26/2016   BILITOT 0.4 03/26/2016    Depression screen [facility] 2/9 06/15/2019 09/13/2018 09/13/2018  Decreased Interest 0 0 0  Down, Depressed, Hopeless 0 0 0  PHQ - 2 Score 0 0 0    Fall Risk  06/15/2019 12/15/2018 09/13/2018 07/28/2016 03/26/2016  Falls in the past year? 0 0 0 No No  Number falls in past yr: 0 0 0 - -  Injury with Fall? 0 0 0 - -  Risk for  fall due to : - Impaired mobility;Impaired balance/gait - - -  Follow up Falls evaluation completed - - - -     No Known Allergies  Prior to Admission medications   Medication Sig Start Date End Date Taking? Authorizing Provider  albuterol (VENTOLIN HFA) 108 (90 Base) MCG/ACT inhaler Inhale 2 puffs into the lungs every 4 (four) hours as needed for wheezing or shortness of breath (cough, shortness of breath or wheezing.). 08/26/18  Yes Law, Alexandra M, PA-C  aspirin EC 81 MG tablet Take 81 mg by mouth daily.   Yes [provider]  atorvastatin (LIPITOR) 10 MG tablet TAKE 1 TABLET(10 MG) BY MOUTH DAILY 05/10/19  Yes Adrian Prows, MD  azelastine (ASTELIN) 0.1 % nasal spray Place 2 sprays into both nostrils 2 (two) times daily as needed for rhinitis. 03/02/19  Yes Valentina Shaggy, MD  Cholecalciferol (VITAMIN D) 50 MCG (2000 UT) CAPS Take by mouth.   Yes [provider]  Multiple Vitamins-Minerals (PRESERVISION AREDS 2 PO) Take 1 tablet by mouth 2 (two) times daily.   Yes [provider]  omeprazole (PRILOSEC) 20 MG capsule TAKE 1 CAPSULE(20 MG) BY MOUTH DAILY BEFORE BREAKFAST 05/15/19  Yes Adrian Prows, MD  diltiazem (CARDIZEM CD) 240 MG 24 hr capsule Take 1 capsule (240  mg total) by mouth daily. 02/15/19 05/19/19  Adrian Prows, MD    Past Medical History:  Diagnosis Date  . Asthma   . Cataract   . Osteopenia determined by x-ray 11/2018   low FRAX score    Past Surgical History:  Procedure Laterality Date  . ABDOMINAL HYSTERECTOMY    . EYE SURGERY      Social History   Tobacco Use  . Smoking status: Never Smoker  . Smokeless tobacco: Never Used  Substance Use Topics  . Alcohol use: No    Family History  Problem Relation Age of Onset  . Heart disease Sister   . Allergic rhinitis Neg Hx   . Angioedema Neg Hx   . Asthma Neg Hx   . Eczema Neg Hx   . Immunodeficiency Neg Hx   . Urticaria Neg Hx     Review of Systems  Constitutional: Negative for  chills and fever.  Respiratory: Negative for cough and shortness of breath.   Cardiovascular: Positive for leg swelling. Negative for chest pain and palpitations.  Gastrointestinal: Negative for abdominal pain, nausea and vomiting.  per hpi   OBJECTIVE:  Today's Vitals   06/15/19 1044  BP: 106/71  Pulse: 77  Temp: 97.6 F (36.4 C)  SpO2: 98%  Weight: 208 lb (94.3 kg)  Height: 4' 11.5" (1.511 m)   Body mass index is 41.31 kg/m.   Physical Exam Vitals and nursing note reviewed.  Constitutional:      Appearance: She is well-developed.  HENT:     Head: Normocephalic and atraumatic.     Mouth/Throat:     Pharynx: No oropharyngeal exudate.  Eyes:     General: No scleral icterus.    Extraocular Movements: Extraocular movements intact.     Conjunctiva/sclera: Conjunctivae normal.     Pupils: Pupils are equal, round, and reactive to light.  Cardiovascular:     Rate and Rhythm: Normal rate and regular rhythm.     Heart sounds: Normal heart sounds. No murmur. No friction rub. No gallop.   Pulmonary:     Effort: Pulmonary effort is normal.     Breath sounds: Normal breath sounds. No wheezing or rales.  Musculoskeletal:     Cervical back: Neck supple.     Right lower leg: No edema.     Left lower leg: No edema (varicose veins present).  Skin:    General: Skin is warm and dry.     Comments: Advanced hair loss with exposure of entire top of head, no scarring noted  Right great toenail. Ingrown wo infection  Neurological:     Mental Status: She is alert and oriented to person, place, and time.     No results found for this or any previous visit (from the past 24 hour(s)).  No results found.   ASSESSMENT and PLAN  1. Alopecia Most likely female pattern baldness, discussed rogaine. Referring to derm for further eval and treatment. - TSH - Ferritin - Ambulatory referral to Dermatology  2. Vitamin D deficiency - Vitamin D, 25-hydroxy  3. Nocturia Discussed cutting  back on fluids intake after dinner, consider meds OAB - Hemoglobin A1c - Urinalysis, Routine w reflex microscopic  4. SVT (supraventricular tachycardia) ([place]) 5. Pure hypercholesterolemia Controlled. Managed by cards - CMP14+EGFR  6. Ingrown right greater toenail - Ambulatory referral to Podiatry  Other orders - triamcinolone cream (KENALOG) 0.1 %; Apply 1 application topically 2 (two) times daily as needed.  Return in about 6 months (around 12/15/2019).  Gina Manthei M Santiago, MD [facility] [address] [place], [place] [address] Ph.  [phone] Fax [phone]   

## 2019-06-15 NOTE — Patient Instructions (Signed)
° ° ° °  If you have lab work done today you will be contacted with your lab results within the next 2 weeks.  If you have not heard from us then please contact us. The fastest way to get your results is to register for My Chart. ° ° °IF you received an x-ray today, you will receive an invoice from [facility]. Please contact [facility] at [phone] with questions or concerns regarding your invoice.  ° °IF you received labwork today, you will receive an invoice from [facility]. Please contact [facility] at [phone] with questions or concerns regarding your invoice.  ° °Our billing staff will not be able to assist you with questions regarding bills from these companies. ° °You will be contacted with the lab results as soon as they are available. The fastest way to get your results is to activate your My Chart account. Instructions are located on the last page of this paperwork. If you have not heard from us regarding the results in 2 weeks, please contact this office. °  ° ° ° °

## 2019-06-16 LAB — CMP14+EGFR
ALT: 17 IU/L (ref 0–32)
AST: 18 IU/L (ref 0–40)
Albumin/Globulin Ratio: 1.3 (ref 1.2–2.2)
Albumin: 4.1 g/dL (ref 3.7–4.7)
Alkaline Phosphatase: 99 IU/L (ref 39–117)
BUN/Creatinine Ratio: 24 (ref 12–28)
BUN: 20 mg/dL (ref 8–27)
Bilirubin Total: 0.6 mg/dL (ref 0.0–1.2)
CO2: 26 mmol/L (ref 20–29)
Calcium: 9.5 mg/dL (ref 8.7–10.3)
Chloride: 104 mmol/L (ref 96–106)
Creatinine, Ser: 0.83 mg/dL (ref 0.57–1.00)
GFR calc Af Amer: 78 mL/min/{1.73_m2} (ref >59)
GFR calc non Af Amer: 68 mL/min/{1.73_m2} (ref >59)
Globulin, Total: 3.1 g/dL (ref 1.5–4.5)
Glucose: 87 mg/dL (ref 65–99)
Potassium: 4.3 mmol/L (ref 3.5–5.2)
Sodium: 142 mmol/L (ref 134–144)
Total Protein: 7.2 g/dL (ref 6.0–8.5)

## 2019-06-16 LAB — URINALYSIS, ROUTINE W REFLEX MICROSCOPIC
Bilirubin, UA: NEGATIVE
Glucose, UA: NEGATIVE
Ketones, UA: NEGATIVE
Nitrite, UA: NEGATIVE
Protein,UA: NEGATIVE
RBC, UA: NEGATIVE
Specific Gravity, UA: 1.02 (ref 1.005–1.030)
Urobilinogen, Ur: 0.2 mg/dL (ref 0.2–1.0)
pH, UA: 6 (ref 5.0–7.5)

## 2019-06-16 LAB — HEMOGLOBIN A1C
Est. average glucose Bld gHb Est-mCnc: 117 mg/dL
Hgb A1c MFr Bld: 5.7 % — ABNORMAL HIGH (ref 4.8–5.6)

## 2019-06-16 LAB — MICROSCOPIC EXAMINATION
Bacteria, UA: NONE SEEN
Casts: NONE SEEN /lpf
Epithelial Cells (non renal): >10 /hpf — AB (ref 0–10)

## 2019-06-16 LAB — VITAMIN D 25 HYDROXY (VIT D DEFICIENCY, FRACTURES): Vit D, 25-Hydroxy: 34.4 ng/mL (ref 30.0–100.0)

## 2019-06-16 LAB — TSH: TSH: 1.7 u[IU]/mL (ref 0.450–4.500)

## 2019-06-16 LAB — FERRITIN: Ferritin: 395 ng/mL — ABNORMAL HIGH (ref 15–150)

## 2019-06-20 ENCOUNTER — Telehealth: Payer: Self-pay | Admitting: Dermatology

## 2019-06-20 NOTE — Telephone Encounter (Signed)
Patient left message on office voice mail saying that she did want appointment (referral).  Gina Guerrero said she wanted to keep the appointment, but [name] did not see an appointment scheduled.

## 2019-07-06 DIAGNOSIS — M792 Neuralgia and neuritis, unspecified: Secondary | ICD-10-CM | POA: Diagnosis not present

## 2019-07-06 DIAGNOSIS — M205X1 Other deformities of toe(s) (acquired), right foot: Secondary | ICD-10-CM | POA: Diagnosis not present

## 2019-07-06 DIAGNOSIS — M898X7 Other specified disorders of bone, ankle and foot: Secondary | ICD-10-CM | POA: Diagnosis not present

## 2019-07-06 DIAGNOSIS — M79671 Pain in right foot: Secondary | ICD-10-CM | POA: Diagnosis not present

## 2019-07-21 DIAGNOSIS — M79671 Pain in right foot: Secondary | ICD-10-CM | POA: Diagnosis not present

## 2019-07-21 DIAGNOSIS — M792 Neuralgia and neuritis, unspecified: Secondary | ICD-10-CM | POA: Diagnosis not present

## 2019-07-21 DIAGNOSIS — M898X7 Other specified disorders of bone, ankle and foot: Secondary | ICD-10-CM | POA: Diagnosis not present

## 2019-07-21 DIAGNOSIS — M205X1 Other deformities of toe(s) (acquired), right foot: Secondary | ICD-10-CM | POA: Diagnosis not present

## 2019-07-28 DIAGNOSIS — M898X7 Other specified disorders of bone, ankle and foot: Secondary | ICD-10-CM | POA: Diagnosis not present

## 2019-07-28 DIAGNOSIS — M25774 Osteophyte, right foot: Secondary | ICD-10-CM | POA: Diagnosis not present

## 2019-07-28 DIAGNOSIS — R002 Palpitations: Secondary | ICD-10-CM | POA: Diagnosis not present

## 2019-08-02 DIAGNOSIS — M898X7 Other specified disorders of bone, ankle and foot: Secondary | ICD-10-CM | POA: Diagnosis not present

## 2019-08-08 ENCOUNTER — Other Ambulatory Visit: Payer: Self-pay | Admitting: Cardiology

## 2019-08-08 DIAGNOSIS — E782 Mixed hyperlipidemia: Secondary | ICD-10-CM

## 2019-08-30 ENCOUNTER — Encounter: Payer: Self-pay | Admitting: Physician Assistant

## 2019-08-30 ENCOUNTER — Ambulatory Visit: Payer: PPO | Admitting: Physician Assistant

## 2019-08-30 ENCOUNTER — Other Ambulatory Visit: Payer: Self-pay

## 2019-08-30 DIAGNOSIS — L659 Nonscarring hair loss, unspecified: Secondary | ICD-10-CM

## 2019-08-30 DIAGNOSIS — L820 Inflamed seborrheic keratosis: Secondary | ICD-10-CM | POA: Diagnosis not present

## 2019-08-30 MED ORDER — NONFORMULARY OR COMPOUNDED ITEM: 1 refills | Status: DC

## 2019-08-30 NOTE — Progress Notes (Signed)
° °  New Patient Visit  Subjective  Gina Guerrero is a 79 y.o. female who presents for the following: Alopecia. She has lost hair centrally and it started back in 1995. It has gotten much worse in the past few years. In the beginning he head itched and had bumps and was tender. It is no longer bumpy or tender. She also has a lesion on her right neck and left cheek.  They have been there 1 year but do not hurt or bleed. No other females with hair loss similar to hers. She feels like some hair is trying to grow back.   Objective  Well appearing patient in no apparent distress; mood and affect are within normal limits.  A focused examination was performed including scalp, face, neck. Relevant physical exam findings are noted in the Assessment and Plan.  Objective  Mid Parietal Scalp: Hair loss within crown. Frontal hairline and sides are spared. No inflammation or papules noted. Possible CCCA with overlapping female pattern loss.   Objective  Left Malar Cheek, Right Anterior Neck: Erythematous stuck-on, waxy papule or plaque.   Assessment & Plan  Alopecia Mid Parietal Scalp  Discussed option to do biopsy. Patient does not want that right now.  We will try clob/tacrolimus/minoxidil compound. Warned if areas have scarred that we may get limited regrowth.  Ordered Medications: NONFORMULARY OR COMPOUNDED ITEM  Inflamed seborrheic keratosis (2) Left Malar Cheek; Right Anterior Neck  Destruction of lesion - Left Malar Cheek, Right Anterior Neck Complexity: simple   Destruction method: cryotherapy   Informed consent: discussed and consent obtained   Timeout:  patient name, date of birth, surgical site, and procedure verified Lesion destroyed using liquid nitrogen: Yes   Outcome: patient tolerated procedure well with no complications

## 2019-08-30 NOTE — Patient Instructions (Signed)
Patient given [facility] phone number to call them tomorrow regarding compounded prescription # [phone].

## 2019-11-01 ENCOUNTER — Other Ambulatory Visit: Payer: Self-pay

## 2019-11-01 ENCOUNTER — Encounter: Payer: Self-pay | Admitting: Physician Assistant

## 2019-11-01 ENCOUNTER — Ambulatory Visit: Payer: PPO | Admitting: Physician Assistant

## 2019-11-01 DIAGNOSIS — L659 Nonscarring hair loss, unspecified: Secondary | ICD-10-CM | POA: Diagnosis not present

## 2019-11-01 NOTE — Progress Notes (Signed)
   Follow up Visit  Subjective  Gina Guerrero is a 79 y.o. female who presents for the following: Alopecia (follow up- hairloss- tx biotin & clob solution- "better- see some new hair "). She was able to get the mixture from the compound pharmacy. She does feel she has some new hair growth.   Objective  Well appearing patient in no apparent distress; mood and affect are within normal limits.  A focused examination was performed including scalp and hair. Relevant physical exam findings are noted in the Assessment and Plan.   Objective  Scalp: Centralized hair loss. Possible note of some minimal new hair growth.   Assessment & Plan  Alopecia Scalp  We discussed scarring alopecia vs. Female pattern loss again. She understands the difference. We discussed that if most of her issue is scarring that her hair may not come back. She is willing to continue with the topical and understands.   Other Related Medications NONFORMULARY OR COMPOUNDED ITEM

## 2019-11-06 ENCOUNTER — Other Ambulatory Visit: Payer: Self-pay | Admitting: Cardiology

## 2019-11-06 DIAGNOSIS — E782 Mixed hyperlipidemia: Secondary | ICD-10-CM

## 2019-11-14 DIAGNOSIS — M792 Neuralgia and neuritis, unspecified: Secondary | ICD-10-CM | POA: Diagnosis not present

## 2019-11-14 DIAGNOSIS — M898X7 Other specified disorders of bone, ankle and foot: Secondary | ICD-10-CM | POA: Diagnosis not present

## 2019-11-22 ENCOUNTER — Ambulatory Visit: Payer: PPO | Admitting: Cardiology

## 2019-11-22 ENCOUNTER — Encounter: Payer: Self-pay | Admitting: Cardiology

## 2019-11-22 ENCOUNTER — Other Ambulatory Visit: Payer: Self-pay

## 2019-11-22 VITALS — BP 114/69 | HR 88 | Resp 16 | Ht 59.0 in | Wt 211.0 lb

## 2019-11-22 DIAGNOSIS — I471 Supraventricular tachycardia: Secondary | ICD-10-CM

## 2019-11-22 DIAGNOSIS — I10 Essential (primary) hypertension: Secondary | ICD-10-CM | POA: Diagnosis not present

## 2019-11-22 DIAGNOSIS — R9431 Abnormal electrocardiogram [ECG] [EKG]: Secondary | ICD-10-CM

## 2019-11-22 DIAGNOSIS — E7800 Pure hypercholesterolemia, unspecified: Secondary | ICD-10-CM

## 2019-11-22 DIAGNOSIS — Z6841 Body Mass Index (BMI) 40.0 and over, adult: Secondary | ICD-10-CM | POA: Diagnosis not present

## 2019-11-22 NOTE — Progress Notes (Signed)
Primary Physician/Referring:  Rutherford Guys, MD  Patient ID: Gina Guerrero, female    DOB: 03/23/1940, 79 y.o.   MRN: [id number]  Chief Complaint  Patient presents with  . PSVT  . Hypertension  . Follow-up    6 month   HPI:    HPI: Gina Guerrero  is a 79 y.o. AAF patient with  medical history is significant for asthma, Hyperlipidemia, morbid obesity, seen in the emergency room on 08/26/2018 when she presented with shortness of breath and wheezing and was also found to have SVT with a heart rate up to 193 bpm.  She spontaneously converted to sinus rhythm after IV diltiazem.  The patient presents for 6 month follow up for PSVT. She reports her palpitations have improved, but she still has occasional episodes, mostly at night. She is taking diltiazem daily and feels this is helping reduce the frequency of her symptoms. When symptoms do occur she does valsalva maneuver with improvement. Denies chest pain, dyspnea.  Past Medical History:  Diagnosis Date  . Asthma   . Cataract   . Osteopenia determined by x-ray 11/2018   low FRAX score   Past Surgical History:  Procedure Laterality Date  . ABDOMINAL HYSTERECTOMY    . EYE SURGERY    . TOE SURGERY     Social History   Tobacco Use  . Smoking status: Never Smoker  . Smokeless tobacco: Never Used  Substance Use Topics  . Alcohol use: No    ROS  Review of Systems  Cardiovascular: Positive for palpitations. Negative for chest pain, dyspnea on exertion and leg swelling.  Gastrointestinal: Negative for melena.   Objective  Blood pressure 114/69, pulse 88, resp. rate 16, height 4\' 11"  (1.499 m), weight 211 lb (95.7 kg), SpO2 98 %. Body mass index is 42.62 kg/m.   Vitals with BMI 11/22/2019 06/15/2019 05/19/2019  Height 4\' 11"  4' 11.5" 4' 11.5"  Weight 211 lbs 208 lbs 209 lbs 11 oz  BMI 42.59 39.76 73.41  Systolic 937 902 409  Diastolic 69 71 70  Pulse 88 77 86    Physical Exam Constitutional:      Appearance: She is  obese.  Cardiovascular:     Rate and Rhythm: Normal rate and regular rhythm.     Pulses: Intact distal pulses.     Heart sounds: Normal heart sounds. No murmur heard.  No gallop.      Comments: No leg edema, no JVD. Mild superficial varicosities on bilateral legs. Pulmonary:     Effort: Pulmonary effort is normal.     Breath sounds: Normal breath sounds.  Abdominal:     General: Bowel sounds are normal.     Palpations: Abdomen is soft.    Radiology: No results found.  Laboratory examination:   CMP Latest Ref Rng & Units 06/15/2019 09/13/2018 08/26/2018  Glucose 65 - 99 mg/dL 87 98 148(H)  BUN 8 - 27 mg/dL 20 15 47(H)  Creatinine 0.57 - 1.00 mg/dL 0.83 0.86 1.30(H)  Sodium 134 - 144 mmol/L 142 143 137  Potassium 3.5 - 5.2 mmol/L 4.3 4.9 4.1  Chloride 96 - 106 mmol/L 104 105 105  CO2 20 - 29 mmol/L 26 24 17(L)  Calcium 8.7 - 10.3 mg/dL 9.5 9.5 8.8(L)  Total Protein 6.0 - 8.5 g/dL 7.2 - -  Total Bilirubin 0.0 - 1.2 mg/dL 0.6 - -  Alkaline Phos 39 - 117 IU/L 99 - -  AST 0 - 40 IU/L 18 - -  ALT 0 - 32 IU/L 17 - -   CBC Latest Ref Rng & Units 08/26/2018 03/26/2016 02/13/2016  WBC 4.0 - 10.5 K/uL 10.1 7.9 8.1  Hemoglobin 12.0 - 15.0 g/dL 14.1 14.2 14.1  Hematocrit 36 - 46 % 42.5 42.1 41.1  Platelets 150 - 400 K/uL 154 237 -   Lipid Panel Recent Labs    03/07/19 0935  CHOL 149  TRIG 65  LDLCALC 78  HDL 58    HEMOGLOBIN A1C Lab Results  Component Value Date   HGBA1C 5.7 (H) 06/15/2019   TSH Recent Labs    06/15/19 1125  TSH 1.700   Medications   No Known Allergies  Current Outpatient Medications on File Prior to Visit  Medication Sig Dispense Refill  . albuterol (VENTOLIN HFA) 108 (90 Base) MCG/ACT inhaler Inhale 2 puffs into the lungs every 4 (four) hours as needed for wheezing or shortness of breath (cough, shortness of breath or wheezing.). 6.7 g 1  . aspirin EC 81 MG tablet Take 81 mg by mouth daily.    Marland Kitchen atorvastatin (LIPITOR) 10 MG tablet TAKE 1 TABLET(10  MG) BY MOUTH DAILY 30 tablet 3  . azelastine (ASTELIN) 0.1 % nasal spray Place 2 sprays into both nostrils 2 (two) times daily as needed for rhinitis. 30 mL 5  . Cholecalciferol (VITAMIN D) 50 MCG (2000 UT) CAPS Take by mouth.    . diltiazem (CARDIZEM CD) 240 MG 24 hr capsule Take 1 capsule (240 mg total) by mouth daily. 90 capsule 1  . Multiple Vitamins-Minerals (PRESERVISION AREDS 2 PO) Take 1 tablet by mouth 2 (two) times daily.    . NONFORMULARY OR COMPOUNDED ITEM Tacrolimus 0.3%, Clobetasol 0.05%, Minoxidil 5% Compound Apply BID 30 each 1  . triamcinolone cream (KENALOG) 0.1 % Apply 1 application topically 2 (two) times daily as needed. 30 g 0   No current facility-administered medications on file prior to visit.     Cardiac Studies:   Echocardiogram 10/14/2018: Left ventricle cavity is normal in size. Normal left ventricular wall thickness. Normal LV systolic function with EF 55%. Normal global wall motion. Doppler evidence of grade I (impaired) diastolic dysfunction, normal LAP.  Left atrial cavity is normal in size. Aneurysmal interatrial septum without 2D or color Doppler evidence of interatrial shunt. Trileaflet aortic valve. Mild aortic valve leaflet calcification. Mild (Grade I) aortic regurgitation. Mild tricuspid regurgitation. Estimated pulmonary artery systolic pressure is 26 mmHg.  Event Monitor for 30 days Start date 11/16/2018:  The patient's monitoring period was 11/16/2018 - 12/15/2018. Baseline sample showed Sinus Rhythm w/Artifact with a heart rate of 71.7 bpm. There were 4 critical and 9 stable events that occurred. The triggered events included occasional PVC. One symptom corelated with SVT @ 160/min.  There were 4 sustained events and longest lasted 9 hours and 10 min and shortest 4 hours and 23 minutes. Symptoms included fatigue and no symptom or accidental push.  Lexiscan Tetrofosmin Stress Test  05/20/2019: Nondiagnostic ECG stress. Mild degree small extent  perfusion defect consistent with breast attenuation in the inferior wall. A small sized mild ischemia cannot be completely excluded.  Stress LV EF: 50%.  No previous exam available for comparison. Low risk study.   EKG:  EKG 11/22/2019: Normal sinus rhythm at rate of 96 bpm, right atrial enlargement, left axis deviation, left anterior fascicular block.  Poor R wave progression, cannot exclude anteroseptal infarct old.  Persistent S wave in lateral leads, consider RVH.  Single PVC.   EKG 08/26/2018:  SVT at rate of 193 bpm, left axis deviation, incomplete right bundle branch block.  Nonspecific T abnormality. Repeat EKG reveals probable ectopic atrial rhythm with short PR interval, leftward axis, poor R-wave progression, incomplete right bundle branch block.  Diffuse nonspecific T abnormality, cannot exclude inferior and anterolateral ischemia.  ST T abnormalities may have been due to either demand ischemia or memory effect S/P SVT examination.  Assessment     ICD-10-CM   1. PSVT (paroxysmal supraventricular tachycardia) (HCC)  I47.1 EKG 12-Lead    PCV ECHOCARDIOGRAM COMPLETE  2. Primary hypertension  I10 PCV ECHOCARDIOGRAM COMPLETE  3. Hypercholesteremia  E78.00   4. Class 3 severe obesity due to excess calories with serious comorbidity and body mass index (BMI) of 40.0 to 44.9 in adult (HCC)  E66.01    Z68.41   5. Nonspecific abnormal electrocardiogram (ECG) (EKG)  R94.31 PCV ECHOCARDIOGRAM COMPLETE     No orders of the defined types were placed in this encounter.  Medications Discontinued During This Encounter  Medication Reason  . omeprazole (PRILOSEC) 20 MG capsule Patient Preference   Recommendations:   Gina Guerrero is a 78 y.o. AAF patient with medical history is significant for asthma, hyperlipidemia, morbid obesity, PSVT, hypertension. She was evaluated by Cristopher Peru, MD in Jan 2021. Patient preferred medical management. She presents for 6 month follow up.   With regard  to PSVT, her symptoms have improved and she is having only occasional episodes of palpitations. She is taking Diltiazem 240 mg and tolerating this well. She has been utilizing the valsalva maneuver when symptoms do occur. Blood pressure is well controlled. Labs were reviewed. Lipids are well controlled.   Her EKG shows new right atrial enlargement and possible RVH. The patient is obese, which raises concern for obesity hypoventilation syndrome resulting in pulmonary hypertension. I have ordered an echocardiogram to exclude pulmonary hypertension. There is no clinical evidence of heart failure at this time. Discussed weight loss with the patient. I will see her back in clinic in one year for follow up.   Gina Heys, PA Student 11/22/19 5:11 PM   Patient seen and examined in conjunction with Gina Heys, PA second year student at [employer].  Time spent is in direct patient face to face encounter not including the teaching and training involved.    Gina Prows, MD, [facility] 11/22/2019, 5:57 PM Office: [phone]

## 2019-11-27 ENCOUNTER — Other Ambulatory Visit: Payer: PPO

## 2019-12-01 ENCOUNTER — Other Ambulatory Visit: Payer: Self-pay

## 2019-12-01 ENCOUNTER — Ambulatory Visit: Payer: PPO

## 2019-12-01 DIAGNOSIS — I471 Supraventricular tachycardia: Secondary | ICD-10-CM | POA: Diagnosis not present

## 2019-12-01 DIAGNOSIS — R9431 Abnormal electrocardiogram [ECG] [EKG]: Secondary | ICD-10-CM

## 2019-12-01 DIAGNOSIS — I10 Essential (primary) hypertension: Secondary | ICD-10-CM | POA: Diagnosis not present

## 2019-12-11 NOTE — Progress Notes (Signed)
Unable to contact pt. Left vm to cb.

## 2019-12-12 NOTE — Progress Notes (Signed)
Called patient, NA, LMAM

## 2019-12-12 NOTE — Progress Notes (Signed)
Unable to speak with patient. Left vm to cb.

## 2019-12-12 NOTE — Progress Notes (Signed)
Patient called back, I have discussed results with her.

## 2019-12-21 ENCOUNTER — Ambulatory Visit: Payer: PPO | Admitting: Family Medicine

## 2020-01-23 ENCOUNTER — Ambulatory Visit (INDEPENDENT_AMBULATORY_CARE_PROVIDER_SITE_OTHER): Payer: PPO | Admitting: Family Medicine

## 2020-01-23 ENCOUNTER — Other Ambulatory Visit: Payer: Self-pay

## 2020-01-23 ENCOUNTER — Encounter: Payer: Self-pay | Admitting: Family Medicine

## 2020-01-23 VITALS — BP 113/71 | HR 77 | Temp 99.0°F | Ht 59.0 in | Wt 206.0 lb

## 2020-01-23 DIAGNOSIS — E782 Mixed hyperlipidemia: Secondary | ICD-10-CM | POA: Diagnosis not present

## 2020-01-23 DIAGNOSIS — Z1159 Encounter for screening for other viral diseases: Secondary | ICD-10-CM

## 2020-01-23 DIAGNOSIS — Z23 Encounter for immunization: Secondary | ICD-10-CM | POA: Diagnosis not present

## 2020-01-23 DIAGNOSIS — I471 Supraventricular tachycardia: Secondary | ICD-10-CM | POA: Diagnosis not present

## 2020-01-23 DIAGNOSIS — R7303 Prediabetes: Secondary | ICD-10-CM

## 2020-01-23 DIAGNOSIS — I10 Essential (primary) hypertension: Secondary | ICD-10-CM

## 2020-01-23 MED ORDER — DILTIAZEM HCL ER COATED BEADS 240 MG PO CP24: 240.0000 mg | ORAL_CAPSULE | Freq: Every day | ORAL | 3 refills | Status: DC

## 2020-01-23 MED ORDER — ATORVASTATIN CALCIUM 10 MG PO TABS: ORAL_TABLET | ORAL | 3 refills | Status: DC

## 2020-01-23 NOTE — Progress Notes (Signed)
11/30/202112:36 PM  Gina Guerrero 02-06-1941, 79 y.o., female [id number]  Chief Complaint  Patient presents with  . Transitions Of Care    HPI:   Patient is a 79 y.o. female with past medical history significant for pSVT, HLD, osteopenia and vitamin D deficiency who presents today with several concerns.  Husband has been in the hospital for kidney infection Recently discharged. Sister is 66 has dementia, she helps care for her Active in her church  HLD Atorvastatin 43m daily Lab Results  Component Value Date   CHOL 149 03/07/2019   HDL 58 03/07/2019   LDLCALC 78 03/07/2019   TRIG 65 03/07/2019   SVT Diltiazem daily Heart palpitations rare Wears compression socks daily  Mammogram: 03/06/19 [place] 1 Colon: home screening done per patient Dexa: 10/2018 Lowest -1.8, osteopenia  [facility] 01/23/2020 06/15/2019 09/13/2018  Decreased Interest 0 0 0  Down, Depressed, Hopeless 0 0 0  PHQ - 2 Score 0 0 0    Fall Risk  01/23/2020 06/15/2019 12/15/2018 09/13/2018 07/28/2016  Falls in the past year? 0 0 0 0 No  Number falls in past yr: 0 0 0 0 -  Injury with Fall? 0 0 0 0 -  Risk for fall due to : - - Impaired mobility;Impaired balance/gait - -  Follow up Falls evaluation completed Falls evaluation completed - - -     No Known Allergies  Prior to Admission medications   Medication Sig Start Date End Date Taking? Authorizing Provider  albuterol (VENTOLIN HFA) 108 (90 Base) MCG/ACT inhaler Inhale 2 puffs into the lungs every 4 (four) hours as needed for wheezing or shortness of breath (cough, shortness of breath or wheezing.). 08/26/18  Yes Law, Alexandra M, PA-C  aspirin EC 81 MG tablet Take 81 mg by mouth daily.   Yes [provider]  azelastine (ASTELIN) 0.1 % nasal spray Place 2 sprays into both nostrils 2 (two) times daily as needed for rhinitis. 03/02/19  Yes [name]  Cholecalciferol (VITAMIN D) 50 MCG (2000 UT) CAPS Take by  mouth.   Yes [provider]  Multiple Vitamins-Minerals (PRESERVISION AREDS 2 PO) Take 1 tablet by mouth 2 (two) times daily.   Yes [provider]  NONFORMULARY OR COMPOUNDED ITEM Tacrolimus 0.3%, Clobetasol 0.05%, Minoxidil 5% Compound Apply BID 08/30/19  Yes Clark-Burning, JAnderson Malta PA-C  triamcinolone cream (KENALOG) 0.1 % Apply 1 application topically 2 (two) times daily as needed. 06/15/19  Yes SRutherford Guys MD  atorvastatin (LIPITOR) 10 MG tablet TAKE 1 TABLET(10 MG) BY MOUTH DAILY Patient not taking: Reported on 01/23/2020 11/06/19   GAdrian Prows MD  diltiazem (CARDIZEM CD) 240 MG 24 hr capsule Take 1 capsule (240 mg total) by mouth daily. 02/15/19 11/22/19  GAdrian Prows MD    Past Medical History:  Diagnosis Date  . Asthma   . Cataract   . Hyperlipidemia   . Hypertension   . Osteopenia determined by x-ray 11/2018   low FRAX score  . Tachycardia     Past Surgical History:  Procedure Laterality Date  . ABDOMINAL HYSTERECTOMY    . EYE SURGERY Bilateral 2013  . TOE SURGERY      Social History   Tobacco Use  . Smoking status: Never Smoker  . Smokeless tobacco: Never Used  Substance Use Topics  . Alcohol use: No    Family History  Problem Relation Age of Onset  . Alopecia Father   . Heart disease Sister   .  Allergic rhinitis Neg Hx   . Angioedema Neg Hx   . Asthma Neg Hx   . Eczema Neg Hx   . Immunodeficiency Neg Hx   . Urticaria Neg Hx     Review of Systems  Constitutional: Negative for chills, fever and malaise/fatigue.  HENT: Negative for hearing loss.   Eyes: Negative for blurred vision and double vision.  Respiratory: Negative for cough, shortness of breath and wheezing.   Cardiovascular: Positive for palpitations. Negative for chest pain and leg swelling.  Gastrointestinal: Negative for abdominal pain, blood in stool, constipation, diarrhea, heartburn, nausea and vomiting.  Genitourinary: Positive for frequency. Negative for dysuria,  hematuria and urgency.  Musculoskeletal: Negative for back pain and joint pain.  Skin: Negative for rash.  Neurological: Negative for dizziness, tingling, weakness and headaches.     OBJECTIVE:  Today's Vitals   01/23/20 1058  BP: 113/71  Pulse: 77  Temp: 99 F (37.2 C)  SpO2: 98%  Weight: 206 lb (93.4 kg)  Height: '4\' 11"'  (1.499 m)   Body mass index is 41.61 kg/m.   Physical Exam Constitutional:      General: She is not in acute distress.    Appearance: Normal appearance. She is not ill-appearing.  HENT:     Head: Normocephalic.  Cardiovascular:     Rate and Rhythm: Normal rate and regular rhythm.     Pulses: Normal pulses.     Heart sounds: Normal heart sounds. No murmur heard.  No friction rub. No gallop.   Pulmonary:     Effort: Pulmonary effort is normal. No respiratory distress.     Breath sounds: Normal breath sounds. No stridor. No wheezing, rhonchi or rales.  Abdominal:     General: Bowel sounds are normal.     Palpations: Abdomen is soft.     Tenderness: There is no abdominal tenderness.  Musculoskeletal:     Right lower leg: No edema.     Left lower leg: No edema.  Skin:    General: Skin is warm and dry.  Neurological:     Mental Status: She is alert and oriented to person, place, and time.  Psychiatric:        Mood and Affect: Mood normal.        Behavior: Behavior normal.     No results found for this or any previous visit (from the past 24 hour(s)).  No results found.   ASSESSMENT and PLAN  Problem List Items Addressed This Visit    None    Visit Diagnoses    Prediabetes    -  Primary   Relevant Orders   Hemoglobin A1c   Mixed hyperlipidemia       Relevant Medications   atorvastatin (LIPITOR) 10 MG tablet   diltiazem (CARDIZEM CD) 240 MG 24 hr capsule   Other Relevant Orders   Lipid Panel   PSVT (paroxysmal supraventricular tachycardia) (HCC)       Relevant Medications   atorvastatin (LIPITOR) 10 MG tablet   diltiazem (CARDIZEM  CD) 240 MG 24 hr capsule   Primary hypertension       Relevant Medications   atorvastatin (LIPITOR) 10 MG tablet   diltiazem (CARDIZEM CD) 240 MG 24 hr capsule   Other Relevant Orders   CMP14+EGFR   Encounter for vaccination       Relevant Orders   Pneumococcal polysaccharide vaccine 23-valent greater than or equal to 2yo subcutaneous/IM (Completed)   Encounter for hepatitis C screening test for low risk patient  Relevant Orders   Hepatitis C antibody     Will follow up with lab results RTC precautions provided   Return in about 3 months (around 04/22/2020).   Huston Foley Thao Vanover, FNP-BC [facility] [place], [place] [address] Ph.  [phone] Fax [phone]

## 2020-01-23 NOTE — Patient Instructions (Addendum)
   If you have lab work done today you will be contacted with your lab results within the next 2 weeks.  If you have not heard from us then please contact us. The fastest way to get your results is to register for My Chart.   IF you received an x-ray today, you will receive an invoice from [facility]. Please contact [facility] at [phone] with questions or concerns regarding your invoice.   IF you received labwork today, you will receive an invoice from [facility]. Please contact [facility] at [phone] with questions or concerns regarding your invoice.   Our billing staff will not be able to assist you with questions regarding bills from these companies.  You will be contacted with the lab results as soon as they are available. The fastest way to get your results is to activate your My Chart account. Instructions are located on the last page of this paperwork. If you have not heard from us regarding the results in 2 weeks, please contact this office.     Health Maintenance After Age 79 After age 65, you are at a higher risk for certain long-term diseases and infections as well as injuries from falls. Falls are a major cause of broken bones and head injuries in people who are older than age 79. Getting regular preventive care can help to keep you healthy and well. Preventive care includes getting regular testing and making lifestyle changes as recommended by your health care provider. Talk with your health care provider about:  Which screenings and tests you should have. A screening is a test that checks for a disease when you have no symptoms.  A diet and exercise plan that is right for you. What should I know about screenings and tests to prevent falls? Screening and testing are the best ways to find a health problem early. Early diagnosis and treatment give you the best chance of managing medical conditions that are common after age 79. Certain conditions and  lifestyle choices may make you more likely to have a fall. Your health care provider may recommend:  Regular vision checks. Poor vision and conditions such as cataracts can make you more likely to have a fall. If you wear glasses, make sure to get your prescription updated if your vision changes.  Medicine review. Work with your health care provider to regularly review all of the medicines you are taking, including over-the-counter medicines. Ask your health care provider about any side effects that may make you more likely to have a fall. Tell your health care provider if any medicines that you take make you feel dizzy or sleepy.  Osteoporosis screening. Osteoporosis is a condition that causes the bones to get weaker. This can make the bones weak and cause them to break more easily.  Blood pressure screening. Blood pressure changes and medicines to control blood pressure can make you feel dizzy.  Strength and balance checks. Your health care provider may recommend certain tests to check your strength and balance while standing, walking, or changing positions.  Foot health exam. Foot pain and numbness, as well as not wearing proper footwear, can make you more likely to have a fall.  Depression screening. You may be more likely to have a fall if you have a fear of falling, feel emotionally low, or feel unable to do activities that you used to do.  Alcohol use screening. Using too much alcohol can affect your balance and may make you more likely to   have a fall. What actions can I take to lower my risk of falls? General instructions  Talk with your health care provider about your risks for falling. Tell your health care provider if: ? You fall. Be sure to tell your health care provider about all falls, even ones that seem minor. ? You feel dizzy, sleepy, or off-balance.  Take over-the-counter and prescription medicines only as told by your health care provider. These include any  supplements.  Eat a healthy diet and maintain a healthy weight. A healthy diet includes low-fat dairy products, low-fat (lean) meats, and fiber from whole grains, beans, and lots of fruits and vegetables. Home safety  Remove any tripping hazards, such as rugs, cords, and clutter.  Install safety equipment such as grab bars in bathrooms and safety rails on stairs.  Keep rooms and walkways well-lit. Activity   Follow a regular exercise program to stay fit. This will help you maintain your balance. Ask your health care provider what types of exercise are appropriate for you.  If you need a cane or walker, use it as recommended by your health care provider.  Wear supportive shoes that have nonskid soles. Lifestyle  Do not drink alcohol if your health care provider tells you not to drink.  If you drink alcohol, limit how much you have: ? 0-1 drink a day for women. ? 0-2 drinks a day for men.  Be aware of how much alcohol is in your drink. In the [place] one drink equals one typical bottle of beer (12 oz), one-half glass of wine (5 oz), or one shot of hard liquor (1 oz).  Do not use any products that contain nicotine or tobacco, such as cigarettes and e-cigarettes. If you need help quitting, ask your health care provider. Summary  Having a healthy lifestyle and getting preventive care can help to protect your health and wellness after age 79.  Screening and testing are the best way to find a health problem early and help you avoid having a fall. Early diagnosis and treatment give you the best chance for managing medical conditions that are more common for people who are older than age 79.  Falls are a major cause of broken bones and head injuries in people who are older than age 79. Take precautions to prevent a fall at home.  Work with your health care provider to learn what changes you can make to improve your health and wellness and to prevent falls. This information is not intended  to replace advice given to you by your health care provider. Make sure you discuss any questions you have with your health care provider. Document Revised: 06/02/2018 Document Reviewed: 12/23/2016 Elsevier Patient Education  2020 Elsevier Inc.  

## 2020-01-24 LAB — CMP14+EGFR
ALT: 21 IU/L (ref 0–32)
AST: 20 IU/L (ref 0–40)
Albumin/Globulin Ratio: 1.3 (ref 1.2–2.2)
Albumin: 4 g/dL (ref 3.7–4.7)
Alkaline Phosphatase: 87 IU/L (ref 44–121)
BUN/Creatinine Ratio: 24 (ref 12–28)
BUN: 17 mg/dL (ref 8–27)
Bilirubin Total: 0.8 mg/dL (ref 0.0–1.2)
CO2: 27 mmol/L (ref 20–29)
Calcium: 9.5 mg/dL (ref 8.7–10.3)
Chloride: 103 mmol/L (ref 96–106)
Creatinine, Ser: 0.72 mg/dL (ref 0.57–1.00)
GFR calc Af Amer: 93 mL/min/{1.73_m2} (ref >59)
GFR calc non Af Amer: 80 mL/min/{1.73_m2} (ref >59)
Globulin, Total: 3 g/dL (ref 1.5–4.5)
Glucose: 104 mg/dL — ABNORMAL HIGH (ref 65–99)
Potassium: 4.3 mmol/L (ref 3.5–5.2)
Sodium: 142 mmol/L (ref 134–144)
Total Protein: 7 g/dL (ref 6.0–8.5)

## 2020-01-24 LAB — LIPID PANEL
Chol/HDL Ratio: 2.4 ratio (ref 0.0–4.4)
Cholesterol, Total: 150 mg/dL (ref 100–199)
HDL: 63 mg/dL (ref >39)
LDL Chol Calc (NIH): 76 mg/dL (ref 0–99)
Triglycerides: 48 mg/dL (ref 0–149)
VLDL Cholesterol Cal: 11 mg/dL (ref 5–40)

## 2020-01-24 LAB — HEPATITIS C ANTIBODY: Hep C Virus Ab: 0.1 s/co ratio (ref 0.0–0.9)

## 2020-01-24 LAB — HEMOGLOBIN A1C
Est. average glucose Bld gHb Est-mCnc: 117 mg/dL
Hgb A1c MFr Bld: 5.7 % — ABNORMAL HIGH (ref 4.8–5.6)

## 2020-01-24 NOTE — Progress Notes (Signed)
If you could let Gina Guerrero know all her labs look great! No changes needed a this time.

## 2020-02-27 DIAGNOSIS — H524 Presbyopia: Secondary | ICD-10-CM | POA: Diagnosis not present

## 2020-02-27 DIAGNOSIS — H04123 Dry eye syndrome of bilateral lacrimal glands: Secondary | ICD-10-CM | POA: Diagnosis not present

## 2020-02-27 DIAGNOSIS — H52223 Regular astigmatism, bilateral: Secondary | ICD-10-CM | POA: Diagnosis not present

## 2020-02-27 DIAGNOSIS — H353131 Nonexudative age-related macular degeneration, bilateral, early dry stage: Secondary | ICD-10-CM | POA: Diagnosis not present

## 2020-03-04 ENCOUNTER — Ambulatory Visit: Payer: PPO | Admitting: Physician Assistant

## 2020-03-05 ENCOUNTER — Ambulatory Visit: Payer: PPO | Admitting: Allergy & Immunology

## 2020-04-23 ENCOUNTER — Encounter: Payer: Self-pay | Admitting: Family Medicine

## 2020-04-23 ENCOUNTER — Other Ambulatory Visit: Payer: Self-pay

## 2020-04-23 ENCOUNTER — Ambulatory Visit (INDEPENDENT_AMBULATORY_CARE_PROVIDER_SITE_OTHER): Payer: PPO | Admitting: Family Medicine

## 2020-04-23 VITALS — BP 110/67 | HR 77 | Temp 99.1°F | Ht 59.0 in | Wt 194.0 lb

## 2020-04-23 DIAGNOSIS — M19011 Primary osteoarthritis, right shoulder: Secondary | ICD-10-CM | POA: Diagnosis not present

## 2020-04-23 DIAGNOSIS — E782 Mixed hyperlipidemia: Secondary | ICD-10-CM | POA: Diagnosis not present

## 2020-04-23 DIAGNOSIS — E559 Vitamin D deficiency, unspecified: Secondary | ICD-10-CM

## 2020-04-23 DIAGNOSIS — R7303 Prediabetes: Secondary | ICD-10-CM | POA: Diagnosis not present

## 2020-04-23 DIAGNOSIS — R002 Palpitations: Secondary | ICD-10-CM | POA: Diagnosis not present

## 2020-04-23 LAB — CMP14+EGFR
ALT: 14 IU/L (ref 0–32)
AST: 15 IU/L (ref 0–40)
Albumin/Globulin Ratio: 1.4 (ref 1.2–2.2)
Albumin: 4 g/dL (ref 3.7–4.7)
Alkaline Phosphatase: 98 IU/L (ref 44–121)
BUN/Creatinine Ratio: 23 (ref 12–28)
BUN: 18 mg/dL (ref 8–27)
Bilirubin Total: 0.6 mg/dL (ref 0.0–1.2)
CO2: 24 mmol/L (ref 20–29)
Calcium: 9.4 mg/dL (ref 8.7–10.3)
Chloride: 107 mmol/L — ABNORMAL HIGH (ref 96–106)
Creatinine, Ser: 0.79 mg/dL (ref 0.57–1.00)
Globulin, Total: 2.8 g/dL (ref 1.5–4.5)
Glucose: 84 mg/dL (ref 65–99)
Potassium: 4.6 mmol/L (ref 3.5–5.2)
Sodium: 147 mmol/L — ABNORMAL HIGH (ref 134–144)
Total Protein: 6.8 g/dL (ref 6.0–8.5)
eGFR: 76 mL/min/{1.73_m2} (ref >59)

## 2020-04-23 LAB — HEMOGLOBIN A1C
Est. average glucose Bld gHb Est-mCnc: 120 mg/dL
Hgb A1c MFr Bld: 5.8 % — ABNORMAL HIGH (ref 4.8–5.6)

## 2020-04-23 NOTE — Patient Instructions (Addendum)
Osteoarthritis  Osteoarthritis is a type of arthritis. It refers to joint pain or joint disease. Osteoarthritis affects tissue that covers the ends of bones in joints (cartilage). Cartilage acts as a cushion between the bones and helps them move smoothly. Osteoarthritis occurs when cartilage in the joints gets worn down. Osteoarthritis is sometimes called "wear and tear" arthritis. Osteoarthritis is the most common form of arthritis. It often occurs in older people. It is a condition that gets worse over time. The joints most often affected by this condition are in the fingers, toes, hips, knees, and spine, including the neck and lower back. What are the causes? This condition is caused by the wearing down of cartilage that covers the ends of bones. What increases the risk? The following factors may make you more likely to develop this condition:  Being age 52 or older.  Obesity.  Overuse of joints.  Past injury of a joint.  Past surgery on a joint.  Family history of osteoarthritis. What are the signs or symptoms? The main symptoms of this condition are pain, swelling, and stiffness in the joint. Other symptoms may include:  An enlarged joint.  More pain and further damage caused by small pieces of bone or cartilage that break off and float inside of the joint.  Small deposits of bone (osteophytes) that grow on the edges of the joint.  A grating or scraping feeling inside the joint when you move it.  Popping or creaking sounds when you move.  Difficulty walking or exercising.  An inability to grip items, twist your hand(s), or control the movements of your hands and fingers. How is this diagnosed? This condition may be diagnosed based on:  Your medical history.  A physical exam.  Your symptoms.  X-rays of the affected joint(s).  Blood tests to rule out other types of arthritis. How is this treated? There is no cure for this condition, but treatment can help  control pain and improve joint function. Treatment may include a combination of therapies, such as:  Pain relief techniques, such as: ? Applying heat and cold to the joint. ? Massage. ? A form of talk therapy called cognitive behavioral therapy (CBT). This therapy helps you set goals and follow up on the changes that you make.  Medicines for pain and inflammation. The medicines can be taken by mouth or applied to the skin. They include: ? NSAIDs, such as ibuprofen. ? Prescription medicines. ? Strong anti-inflammatory medicines (corticosteroids). ? Certain nutritional supplements.  A prescribed exercise program. You may work with a physical therapist.  Assistive devices, such as a brace, wrap, splint, specialized glove, or cane.  A weight control plan.  Surgery, such as: ? An osteotomy. This is done to reposition the bones and relieve pain or to remove loose pieces of bone and cartilage. ? Joint replacement surgery. You may need this surgery if you have advanced osteoarthritis. Follow these instructions at home: Activity  Rest your affected joints as told by your health care provider.  Exercise as told by your health care provider. He or she may recommend specific types of exercise, such as: ? Strengthening exercises. These are done to strengthen the muscles that support joints affected by arthritis. ? Aerobic activities. These are exercises, such as brisk walking or water aerobics, that increase your heart rate. ? Range-of-motion activities. These help your joints move more easily. ? Balance and agility exercises. Managing pain, stiffness, and swelling  If directed, apply heat to the affected area as  often as told by your health care provider. Use the heat source that your health care provider recommends, such as a moist heat pack or a heating pad. ? If you have a removable assistive device, remove it as told by your health care provider. ? Place a towel between your skin and the  heat source. If your health care provider tells you to keep the assistive device on while you apply heat, place a towel between the assistive device and the heat source. ? Leave the heat on for 20-30 minutes. ? Remove the heat if your skin turns bright red. This is especially important if you are unable to feel pain, heat, or cold. You may have a greater risk of getting burned.  If directed, put ice on the affected area. To do this: ? If you have a removable assistive device, remove it as told by your health care provider. ? Put ice in a plastic bag. ? Place a towel between your skin and the bag. If your health care provider tells you to keep the assistive device on during icing, place a towel between the assistive device and the bag. ? Leave the ice on for 20 minutes, 2-3 times a day. ? Move your fingers or toes often to reduce stiffness and swelling. ? Raise (elevate) the injured area above the level of your heart while you are sitting or lying down.      General instructions  Take over-the-counter and prescription medicines only as told by your health care provider.  Maintain a healthy weight. Follow instructions from your health care provider for weight control.  Do not use any products that contain nicotine or tobacco, such as cigarettes, e-cigarettes, and chewing tobacco. If you need help quitting, ask your health care provider.  Use assistive devices as told by your health care provider.  Keep all follow-up visits as told by your health care provider. This is important. Where to find more information  [employer] of Arthritis and Musculoskeletal and Skin Diseases: www.niams.SouthExposed.es  [employer] on Aging: http://kim-miller.com/  American College of Rheumatology: www.rheumatology.org Contact a health care provider if:  You have redness, swelling, or a feeling of warmth in a joint that gets worse.  You have a fever along with joint or muscle aches.  You develop a  rash.  You have trouble doing your normal activities. Get help right away if:  You have pain that gets worse and is not relieved by pain medicine. Summary  Osteoarthritis is a type of arthritis that affects tissue covering the ends of bones in joints (cartilage).  This condition is caused by the wearing down of cartilage that covers the ends of bones.  The main symptom of this condition is pain, swelling, and stiffness in the joint.  There is no cure for this condition, but treatment can help control pain and improve joint function. This information is not intended to replace advice given to you by your health care provider. Make sure you discuss any questions you have with your health care provider. Document Revised: 02/06/2019 Document Reviewed: 02/06/2019 Elsevier Patient Education  2021 [employer].    If you have lab work done today you will be contacted with your lab results within the next 2 weeks.  If you have not heard from [place] then please contact us. The fastest way to get your results is to register for My Chart.   IF you received an x-ray today, you will receive an invoice from [facility]. Please  contact [facility] at [phone] with questions or concerns regarding your invoice.   IF you received labwork today, you will receive an invoice from [facility]. Please contact [facility] at [phone] with questions or concerns regarding your invoice.   Our billing staff will not be able to assist you with questions regarding bills from these companies.  You will be contacted with the lab results as soon as they are available. The fastest way to get your results is to activate your My Chart account. Instructions are located on the last page of this paperwork. If you have not heard from [place] regarding the results in 2 weeks, please contact this office.

## 2020-04-23 NOTE — Progress Notes (Signed)
3/1/202212:03 PM  Gina Guerrero 12-17-1940, 80 y.o., female [id number]  Chief Complaint  Patient presents with  . 3 month follow up   . R shoulder and neck pain and tingling     Currently taking tylenol     HPI:   Patient is a 81 y.o. female with past medical history significant for pSVT, HLD, osteopenia and vitamin D deficiency who presents today with routine followup.  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, but keeping her busy Has not done mammogram yet  Has been having right shoulder pain History of OA Doesn't want joint injections Takes Tylenol for the pain Has used voltaren gel in the past Has never done PT or been to ortho   Pre-diabetes Lab Results  Component Value Date   HGBA1C 5.7 (H) 01/23/2020   Last vitamin D Lab Results  Component Value Date   VD25OH 34.4 06/15/2019    HLD Atorvastatin 45m daily Has been working on improving diet Lab Results  Component Value Date   CHOL 150 01/23/2020   HDL 63 01/23/2020   LDLCALC 76 01/23/2020   TRIG 48 01/23/2020   CHOLHDL 2.4 01/23/2020   SVT Diltiazem daily Heart palpitations rare Wears compression socks daily Only has issues in the morning  Is up frequently in the night urinating  Mammogram: 03/06/19 [place] 1 Colon: home screening done per patient Dexa: 10/2018 Lowest -1.8, osteopenia Health Maintenance  Topic Date Due  . TETANUS/TDAP  Never done  . COVID-19 Vaccine (3 - Pfizer risk 4-dose series) 05/03/2019  . INFLUENZA VACCINE  Completed  . DEXA SCAN  Completed  . Hepatitis C Screening  Completed  . PNA vac Low Risk Adult  Completed  . HPV VACCINES  Aged Out     [facility] 04/23/2020 01/23/2020 06/15/2019  Decreased Interest 0 0 0  Down, Depressed, Hopeless 0 0 0  PHQ - 2 Score 0 0 0    Fall Risk  04/23/2020 01/23/2020 06/15/2019 12/15/2018 09/13/2018  Falls in the past year? 0 0 0 0 0  Number falls in past yr: 0 0 0 0 0  Injury with Fall? 0 0 0 0 0   Risk for fall due to : - - - Impaired mobility;Impaired balance/gait -  Follow up Falls evaluation completed Falls evaluation completed Falls evaluation completed - -     No Known Allergies  Prior to Admission medications   Medication Sig Start Date End Date Taking? Authorizing Provider  albuterol (VENTOLIN HFA) 108 (90 Base) MCG/ACT inhaler Inhale 2 puffs into the lungs every 4 (four) hours as needed for wheezing or shortness of breath (cough, shortness of breath or wheezing.). 08/26/18  Yes Law, Alexandra M, PA-C  aspirin EC 81 MG tablet Take 81 mg by mouth daily.   Yes [provider]  azelastine (ASTELIN) 0.1 % nasal spray Place 2 sprays into both nostrils 2 (two) times daily as needed for rhinitis. 03/02/19  Yes [name]  Cholecalciferol (VITAMIN D) 50 MCG (2000 UT) CAPS Take by mouth.   Yes [provider]  Multiple Vitamins-Minerals (PRESERVISION AREDS 2 PO) Take 1 tablet by mouth 2 (two) times daily.   Yes [provider]  NONFORMULARY OR COMPOUNDED ITEM Tacrolimus 0.3%, Clobetasol 0.05%, Minoxidil 5% Compound Apply BID 08/30/19  Yes Clark-Burning, JAnderson Malta PA-C  triamcinolone cream (KENALOG) 0.1 % Apply 1 application topically 2 (two) times daily as needed. 06/15/19  Yes SRutherford Guys MD  atorvastatin (LIPITOR) 10 MG tablet TAKE 1 TABLET(10 MG) BY MOUTH DAILY Patient not taking: Reported on 01/23/2020 11/06/19   Adrian Prows, MD  diltiazem (CARDIZEM CD) 240 MG 24 hr capsule Take 1 capsule (240 mg total) by mouth daily. 02/15/19 11/22/19  Adrian Prows, MD    Past Medical History:  Diagnosis Date  . Asthma   . Cataract   . Hyperlipidemia   . Hypertension   . Osteopenia determined by x-ray 11/2018   low FRAX score  . Tachycardia     Past Surgical History:  Procedure Laterality Date  . ABDOMINAL HYSTERECTOMY    . EYE SURGERY Bilateral 2013  . TOE SURGERY      Social History   Tobacco Use  . Smoking status: Never Smoker  . Smokeless  tobacco: Never Used  Substance Use Topics  . Alcohol use: No    Family History  Problem Relation Age of Onset  . Alopecia Father   . Heart disease Sister   . Allergic rhinitis Neg Hx   . Angioedema Neg Hx   . Asthma Neg Hx   . Eczema Neg Hx   . Immunodeficiency Neg Hx   . Urticaria Neg Hx     Review of Systems  Constitutional: Negative for chills, fever and malaise/fatigue.  HENT: Negative for hearing loss.   Eyes: Negative for blurred vision and double vision.  Respiratory: Negative for cough, shortness of breath and wheezing.   Cardiovascular: Positive for palpitations. Negative for chest pain and leg swelling.  Gastrointestinal: Negative for abdominal pain, blood in stool, constipation, diarrhea, heartburn, nausea and vomiting.  Genitourinary: Positive for frequency. Negative for dysuria, hematuria and urgency.  Musculoskeletal: Positive for joint pain (right shoulder pain). Negative for back pain.  Skin: Negative for rash.  Neurological: Negative for dizziness, tingling, sensory change, focal weakness, weakness and headaches.     OBJECTIVE:  Today's Vitals   04/23/20 1140  BP: 110/67  Pulse: 77  Temp: 99.1 F (37.3 C)  SpO2: 97%  Weight: 194 lb (88 kg)  Height: _0  (1.499 m)   Body mass index is 39.18 kg/m.   Physical Exam Constitutional:      General: She is not in acute distress.    Appearance: Normal appearance. She is not ill-appearing.  HENT:     Head: Normocephalic.  Cardiovascular:     Rate and Rhythm: Normal rate and regular rhythm.     Pulses: Normal pulses.     Heart sounds: Normal heart sounds. No murmur heard. No friction rub. No gallop.   Pulmonary:     Effort: Pulmonary effort is normal. No respiratory distress.     Breath sounds: Normal breath sounds. No stridor. No wheezing, rhonchi or rales.  Abdominal:     General: Bowel sounds are normal.     Palpations: Abdomen is soft.     Tenderness: There is no abdominal tenderness.   Musculoskeletal:     Right shoulder: Normal.     Left shoulder: Normal.       Arms:     Right lower leg: No edema.     Left lower leg: No edema.  Skin:    General: Skin is warm and dry.  Neurological:     Mental Status: She is alert and oriented to person, place, and time.  Psychiatric:        Mood and Affect: Mood normal.        Behavior: Behavior normal.     No results found for  this or any previous visit (from the past 24 hour(s)).  No results found.   ASSESSMENT and PLAN  Problem List Items Addressed This Visit      Other   Palpitations - Primary   Relevant Orders   CMP14+EGFR   Prediabetes   Relevant Orders   Hemoglobin A1c   Mixed hyperlipidemia   Relevant Orders   Lipid Panel    Other Visit Diagnoses    Primary osteoarthritis of right shoulder       Vitamin D deficiency       Relevant Orders   Vitamin D, 25-hydroxy      Will follow up with lab results RTC precautions provided Declined PT and Ortho referral, will follow up   Return in about 3 months (around 07/24/2020).   Huston Foley Adrianne Shackleton, FNP-BC [facility] [place], [place] [address] Ph.  [phone] Fax [phone]

## 2020-04-24 LAB — LIPID PANEL
Chol/HDL Ratio: 2.9 ratio (ref 0.0–4.4)
Cholesterol, Total: 158 mg/dL (ref 100–199)
HDL: 54 mg/dL (ref >39)
LDL Chol Calc (NIH): 88 mg/dL (ref 0–99)
Triglycerides: 84 mg/dL (ref 0–149)
VLDL Cholesterol Cal: 16 mg/dL (ref 5–40)

## 2020-04-24 LAB — VITAMIN D 25 HYDROXY (VIT D DEFICIENCY, FRACTURES): Vit D, 25-Hydroxy: 31.3 ng/mL (ref 30.0–100.0)

## 2020-04-24 NOTE — Progress Notes (Signed)
Overall labs look good. A1c places you in the prediabetes range. No medications needed at this time, continue to work on diet and exercise. Your Sodium is elevated increase fluids as able.

## 2020-04-25 ENCOUNTER — Telehealth: Payer: Self-pay | Admitting: Family Medicine

## 2020-04-25 NOTE — Telephone Encounter (Signed)
Pt would like a cb concerning her most recent lab results. Please advise at [phone].

## 2020-04-25 NOTE — Telephone Encounter (Signed)
Pt discussed with [employer]

## 2020-07-02 ENCOUNTER — Ambulatory Visit: Payer: PPO | Admitting: Emergency Medicine

## 2020-08-01 ENCOUNTER — Ambulatory Visit: Payer: PPO | Admitting: Emergency Medicine

## 2020-09-16 DIAGNOSIS — Z131 Encounter for screening for diabetes mellitus: Secondary | ICD-10-CM | POA: Diagnosis not present

## 2020-09-16 DIAGNOSIS — R0602 Shortness of breath: Secondary | ICD-10-CM | POA: Diagnosis not present

## 2020-09-16 DIAGNOSIS — R5383 Other fatigue: Secondary | ICD-10-CM | POA: Diagnosis not present

## 2020-09-16 DIAGNOSIS — Z0000 Encounter for general adult medical examination without abnormal findings: Secondary | ICD-10-CM | POA: Diagnosis not present

## 2020-09-16 DIAGNOSIS — Z9181 History of falling: Secondary | ICD-10-CM | POA: Diagnosis not present

## 2020-09-16 DIAGNOSIS — Z125 Encounter for screening for malignant neoplasm of prostate: Secondary | ICD-10-CM | POA: Diagnosis not present

## 2020-09-16 DIAGNOSIS — Z1159 Encounter for screening for other viral diseases: Secondary | ICD-10-CM | POA: Diagnosis not present

## 2020-09-16 DIAGNOSIS — E559 Vitamin D deficiency, unspecified: Secondary | ICD-10-CM | POA: Diagnosis not present

## 2020-09-16 DIAGNOSIS — Z113 Encounter for screening for infections with a predominantly sexual mode of transmission: Secondary | ICD-10-CM | POA: Diagnosis not present

## 2020-09-16 DIAGNOSIS — Z1339 Encounter for screening examination for other mental health and behavioral disorders: Secondary | ICD-10-CM | POA: Diagnosis not present

## 2020-09-16 DIAGNOSIS — R350 Frequency of micturition: Secondary | ICD-10-CM | POA: Diagnosis not present

## 2020-09-16 DIAGNOSIS — E7800 Pure hypercholesterolemia, unspecified: Secondary | ICD-10-CM | POA: Diagnosis not present

## 2020-09-16 DIAGNOSIS — Z79899 Other long term (current) drug therapy: Secondary | ICD-10-CM | POA: Diagnosis not present

## 2020-09-16 DIAGNOSIS — D539 Nutritional anemia, unspecified: Secondary | ICD-10-CM | POA: Diagnosis not present

## 2020-09-16 DIAGNOSIS — Z1331 Encounter for screening for depression: Secondary | ICD-10-CM | POA: Diagnosis not present

## 2020-09-16 DIAGNOSIS — E669 Obesity, unspecified: Secondary | ICD-10-CM | POA: Diagnosis not present

## 2020-10-01 DIAGNOSIS — H35371 Puckering of macula, right eye: Secondary | ICD-10-CM | POA: Diagnosis not present

## 2020-10-01 DIAGNOSIS — H52223 Regular astigmatism, bilateral: Secondary | ICD-10-CM | POA: Diagnosis not present

## 2020-10-01 DIAGNOSIS — H353131 Nonexudative age-related macular degeneration, bilateral, early dry stage: Secondary | ICD-10-CM | POA: Diagnosis not present

## 2020-10-01 DIAGNOSIS — H524 Presbyopia: Secondary | ICD-10-CM | POA: Diagnosis not present

## 2020-10-01 DIAGNOSIS — H04123 Dry eye syndrome of bilateral lacrimal glands: Secondary | ICD-10-CM | POA: Diagnosis not present

## 2020-10-22 IMAGING — DX PORTABLE CHEST - 1 VIEW
1 series · 1 of 1 positions shown · non-contrast
Comparison: Chest x-ray dated March 26, 2016.

CLINICAL DATA: Intermittent chest pain with shortness of breath and
cough for the past week.

EXAM:
PORTABLE CHEST 1 VIEW

[chest ap]
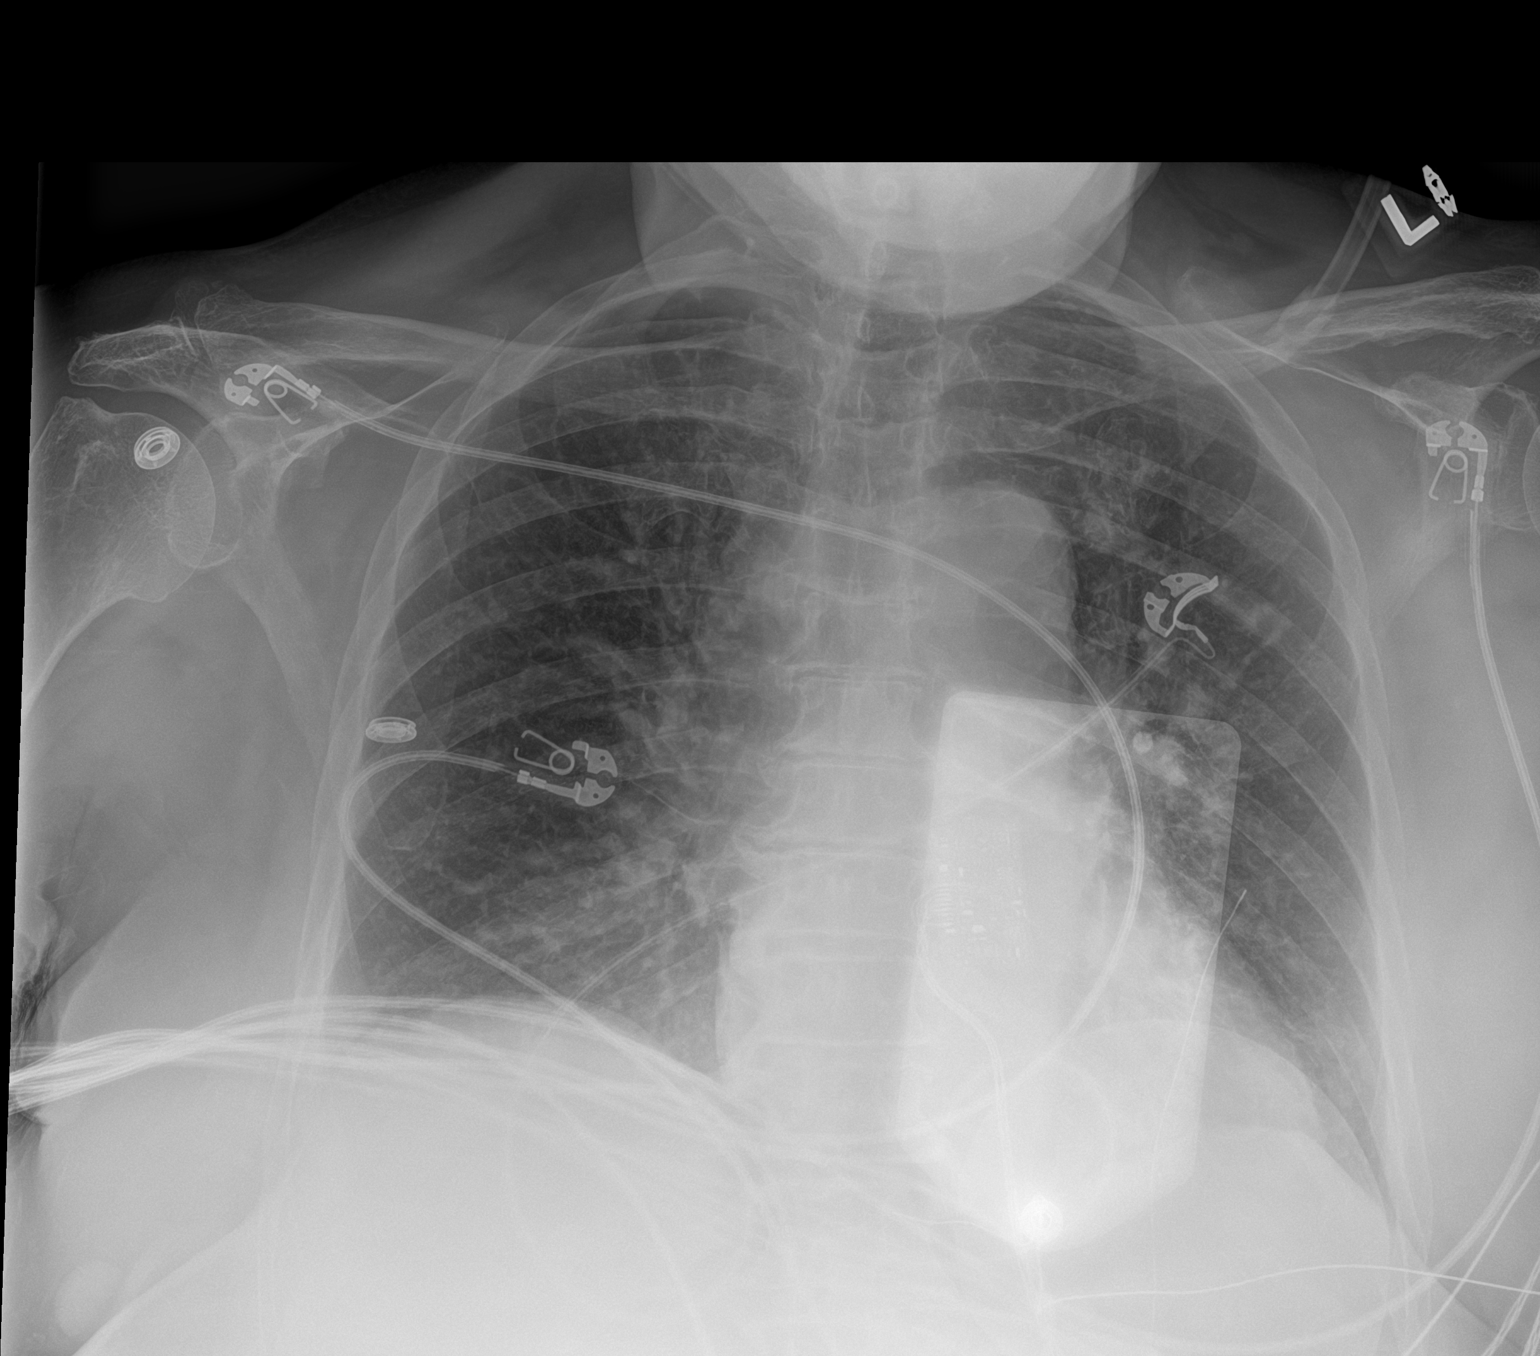

[1 of 1 positions shown; findings below may reference images not displayed]

FINDINGS: The heart size and mediastinal contours are within normal limits.
Central peribronchial thickening. No focal consolidation, pleural
effusion, or pneumothorax. No acute osseous abnormality.
IMPRESSION: 1. Bronchitic changes.

## 2020-11-21 ENCOUNTER — Ambulatory Visit: Payer: PPO | Admitting: Cardiology

## 2020-11-22 ENCOUNTER — Ambulatory Visit: Payer: PPO | Admitting: Cardiology

## 2020-11-22 ENCOUNTER — Other Ambulatory Visit: Payer: Self-pay

## 2020-11-22 ENCOUNTER — Encounter: Payer: Self-pay | Admitting: Cardiology

## 2020-11-22 VITALS — BP 113/64 | HR 71 | Temp 97.7°F | Resp 17 | Ht 59.0 in | Wt 186.0 lb

## 2020-11-22 DIAGNOSIS — I471 Supraventricular tachycardia, unspecified: Secondary | ICD-10-CM

## 2020-11-22 DIAGNOSIS — I10 Essential (primary) hypertension: Secondary | ICD-10-CM

## 2020-11-22 MED ORDER — PROPRANOLOL HCL 40 MG PO TABS: 40.0000 mg | ORAL_TABLET | Freq: Three times a day (TID) | ORAL | 3 refills | Status: DC | PRN

## 2020-11-22 NOTE — Progress Notes (Signed)
Primary Physician/Referring:  Simona Huh, NP  Patient ID: Gina Guerrero, female    DOB: 1940/08/06, 80 y.o.   MRN: [id number]  Chief Complaint  Patient presents with   PSVT   New Patient (Initial Visit)   HPI:    HPI: Gina Guerrero  is a 80 y.o. AAF patient with  medical history is significant for asthma, Hyperlipidemia, morbid obesity, seen in the emergency room on 08/26/2018 when she presented with shortness of breath and wheezing and was also found to have SVT with a heart rate up to 193 bpm.  She spontaneously converted to sinus rhythm after IV diltiazem.  The patient presents for 6 month follow up for PSVT. She reports her palpitations have improved, but she still has occasional episodes, mostly when she gets up and have been at times lasting 4 hours and is wondering if there is any other medication she can take. When symptoms do occur she does valsalva maneuver with improvement. Denies chest pain, dyspnea.  Past Medical History:  Diagnosis Date   Asthma    Cataract    Hyperlipidemia    Hypertension    Osteopenia determined by x-ray 11/2018   low FRAX score   Tachycardia    Past Surgical History:  Procedure Laterality Date   ABDOMINAL HYSTERECTOMY     EYE SURGERY Bilateral 2013   TOE SURGERY     Social History   Tobacco Use   Smoking status: Never   Smokeless tobacco: Never  Substance Use Topics   Alcohol use: No    ROS  Review of Systems  Cardiovascular:  Positive for palpitations. Negative for chest pain, dyspnea on exertion and leg swelling.  Gastrointestinal:  Negative for melena.  Objective  Blood pressure 113/64, pulse 71, temperature 97.7 F (36.5 C), temperature source Temporal, resp. rate 17, height 4\' 11"  (1.499 m), weight 186 lb (84.4 kg), SpO2 99 %. Body mass index is 37.57 kg/m.   Vitals with BMI 11/22/2020 04/23/2020 01/23/2020  Height 4\' 11"  4\' 11"  4\' 11"   Weight 186 lbs 194 lbs 206 lbs  BMI 37.55 83.66 29.47  Systolic 654 650 354   Diastolic 64 67 71  Pulse 71 77 77    Physical Exam Constitutional:      Appearance: She is obese.  Cardiovascular:     Rate and Rhythm: Normal rate and regular rhythm.     Pulses: Intact distal pulses.     Heart sounds: Normal heart sounds. No murmur heard.   No gallop.     Comments: No leg edema, no JVD. Mild superficial varicosities on bilateral legs. Pulmonary:     Effort: Pulmonary effort is normal.     Breath sounds: Normal breath sounds.  Abdominal:     General: Bowel sounds are normal.     Palpations: Abdomen is soft.   Radiology: No results found.  Laboratory examination:   CMP Latest Ref Rng & Units 04/23/2020 01/23/2020 06/15/2019  Glucose 65 - 99 mg/dL 84 104(H) 87  BUN 8 - 27 mg/dL 18 17 20   Creatinine 0.57 - 1.00 mg/dL 0.79 0.72 0.83  Sodium 134 - 144 mmol/L 147(H) 142 142  Potassium 3.5 - 5.2 mmol/L 4.6 4.3 4.3  Chloride 96 - 106 mmol/L 107(H) 103 104  CO2 20 - 29 mmol/L 24 27 26   Calcium 8.7 - 10.3 mg/dL 9.4 9.5 9.5  Total Protein 6.0 - 8.5 g/dL 6.8 7.0 7.2  Total Bilirubin 0.0 - 1.2 mg/dL 0.6 0.8 0.6  Alkaline Phos  44 - 121 IU/L 98 87 99  AST 0 - 40 IU/L 15 20 18   ALT 0 - 32 IU/L 14 21 17    CBC Latest Ref Rng & Units 08/26/2018 03/26/2016 02/13/2016  WBC 4.0 - 10.5 K/uL 10.1 7.9 8.1  Hemoglobin 12.0 - 15.0 g/dL 14.1 14.2 14.1  Hematocrit 36.0 - 46.0 % 42.5 42.1 41.1  Platelets 150 - 400 K/uL 154 237 -   Lipid Panel Recent Labs    01/23/20 1205 04/23/20 1213  CHOL 150 158  TRIG 48 84  LDLCALC 76 88  HDL 63 54  CHOLHDL 2.4 2.9    HEMOGLOBIN A1C Lab Results  Component Value Date   HGBA1C 5.8 (H) 04/23/2020   TSH No results for input(s): TSH in the last 8760 hours.  Medications   No Known Allergies  Current Outpatient Medications on File Prior to Visit  Medication Sig Dispense Refill   albuterol (VENTOLIN HFA) 108 (90 Base) MCG/ACT inhaler Inhale 2 puffs into the lungs every 4 (four) hours as needed for wheezing or shortness of breath  (cough, shortness of breath or wheezing.). 6.7 g 1   aspirin EC 81 MG tablet Take 81 mg by mouth daily.     atorvastatin (LIPITOR) 10 MG tablet TAKE 1 TABLET(10 MG) BY MOUTH DAILY 90 tablet 3   azelastine (ASTELIN) 0.1 % nasal spray Place 2 sprays into both nostrils 2 (two) times daily as needed for rhinitis. 30 mL 5   Cholecalciferol (VITAMIN D) 50 MCG (2000 UT) CAPS Take by mouth.     diltiazem (CARDIZEM CD) 240 MG 24 hr capsule Take 1 capsule (240 mg total) by mouth daily. 90 capsule 3   Multiple Vitamins-Minerals (PRESERVISION AREDS 2 PO) Take 1 tablet by mouth 2 (two) times daily.     triamcinolone cream (KENALOG) 0.1 % Apply 1 application topically 2 (two) times daily as needed. 30 g 0   No current facility-administered medications on file prior to visit.     Cardiac Studies:   Event Monitor for 30 days Start date 11/16/2018:  The patient's monitoring period was 11/16/2018 - 12/15/2018. Baseline sample showed Sinus Rhythm w/Artifact with a heart rate of 71.7 bpm. There were 4 critical and 9 stable events that occurred. The triggered events included occasional PVC. One symptom corelated with SVT @ 160/min.  There were 4 sustained events and longest lasted 9 hours and 10 min and shortest 4 hours and 23 minutes. Symptoms included fatigue and no symptom or accidental push.  Lexiscan Tetrofosmin Stress Test  05/20/2019: Nondiagnostic ECG stress. Mild degree small extent perfusion defect consistent with breast attenuation in the inferior wall. A small sized mild ischemia cannot be completely excluded.  Stress LV EF: 50%.  No previous exam available for comparison. Low risk study.   PCV ECHOCARDIOGRAM COMPLETE 12/01/2019  Narrative Echocardiogram 12/01/2019: Normal LV systolic function with visual EF 50-55%. Left ventricle cavity is normal in size. Normal global wall motion. Normal diastolic filling pattern, normal LAP. Mild (Grade I) aortic regurgitation. Mild tricuspid  regurgitation. Compared to prior study dated 10/14/2019: No significant change noted.   EKG:  EKG 11/22/2020: Normal sinus rhythm at rate of 63 bpm, left axis deviation, left anterior fascicular block.  Incomplete right bundle branch block.  Poor R wave progression, cannot exclude anteroseptal infarct old.  No evidence of ischemia, normal QT interval.   EKG 08/26/2018: SVT at rate of 193 bpm, left axis deviation, incomplete right bundle branch block.  Nonspecific T abnormality. Repeat EKG  reveals probable ectopic atrial rhythm with short PR interval, leftward axis, poor R-wave progression, incomplete right bundle branch block.  Diffuse nonspecific T abnormality, cannot exclude inferior and anterolateral ischemia.  ST T abnormalities may have been due to either demand ischemia or memory effect S/P SVT examination.  Assessment     ICD-10-CM   1. PSVT (paroxysmal supraventricular tachycardia) (HCC)  I47.1 EKG 12-Lead    propranolol (INDERAL) 40 MG tablet    2. Primary hypertension  I10        Meds ordered this encounter  Medications   propranolol (INDERAL) 40 MG tablet    Sig: Take 1 tablet (40 mg total) by mouth 3 (three) times daily as needed (Heart racing).    Dispense:  30 tablet    Refill:  3    Medications Discontinued During This Encounter  Medication Reason   NONFORMULARY OR COMPOUNDED ITEM Error   Recommendations:   Gina Guerrero is a 80 y.o. AAF patient with medical history is significant for asthma, hyperlipidemia, morbid obesity, PSVT, hypertension. She was evaluated by Cristopher Peru, MD in Jan 2021. Patient preferred medical management. She presents for 6 month follow up.   With regard to PSVT, her symptoms have improved and she is having only occasional episodes of palpitations. She is taking Diltiazem 240 mg and tolerating this well. She has been utilizing the valsalva maneuver when symptoms do occur. Although not often she has been having symptoms sometimes lasting  4 hours. I will Rx Propranolol 40 mg TID PRN. Her asthma is under good control and she will use BB only on a prn basis.  She has so far lost about 25 Lbs in a year.   Blood pressure is well controlled. Labs were reviewed. Lipids are well controlled. OV in 1 year.    Adrian Prows, MD, [facility] 11/23/2020, 8:52 AM Office: [phone]

## 2020-12-10 DIAGNOSIS — I4891 Unspecified atrial fibrillation: Secondary | ICD-10-CM | POA: Diagnosis not present

## 2020-12-10 DIAGNOSIS — Z23 Encounter for immunization: Secondary | ICD-10-CM | POA: Diagnosis not present

## 2020-12-10 DIAGNOSIS — E7800 Pure hypercholesterolemia, unspecified: Secondary | ICD-10-CM | POA: Diagnosis not present

## 2020-12-10 DIAGNOSIS — R7303 Prediabetes: Secondary | ICD-10-CM | POA: Diagnosis not present

## 2020-12-10 DIAGNOSIS — Z79899 Other long term (current) drug therapy: Secondary | ICD-10-CM | POA: Diagnosis not present

## 2020-12-10 DIAGNOSIS — E669 Obesity, unspecified: Secondary | ICD-10-CM | POA: Diagnosis not present

## 2020-12-10 DIAGNOSIS — Z1159 Encounter for screening for other viral diseases: Secondary | ICD-10-CM | POA: Diagnosis not present

## 2020-12-10 DIAGNOSIS — R350 Frequency of micturition: Secondary | ICD-10-CM | POA: Diagnosis not present

## 2020-12-10 DIAGNOSIS — Z9181 History of falling: Secondary | ICD-10-CM | POA: Diagnosis not present

## 2020-12-10 DIAGNOSIS — D539 Nutritional anemia, unspecified: Secondary | ICD-10-CM | POA: Diagnosis not present

## 2020-12-10 DIAGNOSIS — M79604 Pain in right leg: Secondary | ICD-10-CM | POA: Diagnosis not present

## 2020-12-10 DIAGNOSIS — E559 Vitamin D deficiency, unspecified: Secondary | ICD-10-CM | POA: Diagnosis not present

## 2020-12-10 DIAGNOSIS — R5383 Other fatigue: Secondary | ICD-10-CM | POA: Diagnosis not present

## 2020-12-30 DIAGNOSIS — Z6837 Body mass index (BMI) 37.0-37.9, adult: Secondary | ICD-10-CM | POA: Diagnosis not present

## 2020-12-30 DIAGNOSIS — M1611 Unilateral primary osteoarthritis, right hip: Secondary | ICD-10-CM | POA: Diagnosis not present

## 2020-12-30 DIAGNOSIS — I4891 Unspecified atrial fibrillation: Secondary | ICD-10-CM | POA: Diagnosis not present

## 2020-12-30 DIAGNOSIS — M25551 Pain in right hip: Secondary | ICD-10-CM | POA: Diagnosis not present

## 2020-12-31 ENCOUNTER — Other Ambulatory Visit: Payer: Self-pay

## 2020-12-31 DIAGNOSIS — I739 Peripheral vascular disease, unspecified: Secondary | ICD-10-CM

## 2021-01-01 ENCOUNTER — Other Ambulatory Visit: Payer: Self-pay | Admitting: Nurse Practitioner

## 2021-01-09 DIAGNOSIS — E7800 Pure hypercholesterolemia, unspecified: Secondary | ICD-10-CM | POA: Diagnosis not present

## 2021-01-09 DIAGNOSIS — M79604 Pain in right leg: Secondary | ICD-10-CM | POA: Diagnosis not present

## 2021-01-09 DIAGNOSIS — I4891 Unspecified atrial fibrillation: Secondary | ICD-10-CM | POA: Diagnosis not present

## 2021-01-09 DIAGNOSIS — Z9181 History of falling: Secondary | ICD-10-CM | POA: Diagnosis not present

## 2021-01-09 DIAGNOSIS — E669 Obesity, unspecified: Secondary | ICD-10-CM | POA: Diagnosis not present

## 2021-01-09 DIAGNOSIS — R7303 Prediabetes: Secondary | ICD-10-CM | POA: Diagnosis not present

## 2021-01-09 DIAGNOSIS — R350 Frequency of micturition: Secondary | ICD-10-CM | POA: Diagnosis not present

## 2021-01-22 ENCOUNTER — Other Ambulatory Visit: Payer: Self-pay

## 2021-01-22 ENCOUNTER — Ambulatory Visit: Payer: PPO | Admitting: Vascular Surgery

## 2021-01-22 ENCOUNTER — Encounter: Payer: Self-pay | Admitting: Vascular Surgery

## 2021-01-22 ENCOUNTER — Ambulatory Visit (HOSPITAL_COMMUNITY)
Admission: RE | Admit: 2021-01-22 | Discharge: 2021-01-22 | Disposition: A | Discharge status: Home / Self Care | Payer: PPO | Source: Ambulatory Visit | Attending: Vascular Surgery | Admitting: Vascular Surgery

## 2021-01-22 VITALS — BP 108/61 | HR 62 | Temp 97.6°F | Resp 20 | Ht 59.0 in | Wt 193.0 lb

## 2021-01-22 DIAGNOSIS — I739 Peripheral vascular disease, unspecified: Secondary | ICD-10-CM

## 2021-01-22 DIAGNOSIS — I872 Venous insufficiency (chronic) (peripheral): Secondary | ICD-10-CM

## 2021-01-22 NOTE — Progress Notes (Signed)
Patient ID: Gina Guerrero, female   DOB: Jan 09, 1941, 80 y.o.   MRN: [id number]  Reason for Consult: New Patient (Initial Visit)   Referred by Simona Huh, NP  Subjective:     HPI:  Gina Guerrero is a 80 y.o. female without previous vascular disease.  She does have risk factors hypertension and hyperlipidemia.  She has an abnormality of her left medial ankle for which she is sent for arterial evaluation.  She denies any previous vascular intervention.  She does not have any history of stroke, TIA or amaurosis.  No personal family history of aneurysm disease.  She walks without limitation.  She has a small area on her left medial ankle which has bled in the past.  She does not have any history of DVT.  She does not take any blood thinners but does take aspirin daily.  Past Medical History:  Diagnosis Date   Asthma    Cataract    Hyperlipidemia    Hypertension    Osteopenia determined by x-ray 11/2018   low FRAX score   Tachycardia    Family History  Problem Relation Age of Onset   Alopecia Father    Heart disease Sister    COPD Brother    Allergic rhinitis Neg Hx    Angioedema Neg Hx    Asthma Neg Hx    Eczema Neg Hx    Immunodeficiency Neg Hx    Urticaria Neg Hx    Past Surgical History:  Procedure Laterality Date   ABDOMINAL HYSTERECTOMY     EYE SURGERY Bilateral 2013   TOE SURGERY      Short Social History:  Social History   Tobacco Use   Smoking status: Never   Smokeless tobacco: Never  Substance Use Topics   Alcohol use: No    No Known Allergies  Current Outpatient Medications  Medication Sig Dispense Refill   albuterol (VENTOLIN HFA) 108 (90 Base) MCG/ACT inhaler Inhale 2 puffs into the lungs every 4 (four) hours as needed for wheezing or shortness of breath (cough, shortness of breath or wheezing.). 6.7 g 1   aspirin EC 81 MG tablet Take 81 mg by mouth daily.     atorvastatin (LIPITOR) 10 MG tablet TAKE 1 TABLET(10 MG) BY MOUTH DAILY 90 tablet  3   azelastine (ASTELIN) 0.1 % nasal spray Place 2 sprays into both nostrils 2 (two) times daily as needed for rhinitis. 30 mL 5   Cholecalciferol (VITAMIN D) 50 MCG (2000 UT) CAPS Take by mouth.     Multiple Vitamins-Minerals (PRESERVISION AREDS 2 PO) Take 1 tablet by mouth 2 (two) times daily.     propranolol (INDERAL) 40 MG tablet Take 1 tablet (40 mg total) by mouth 3 (three) times daily as needed (Heart racing). 30 tablet 3   triamcinolone cream (KENALOG) 0.1 % Apply 1 application topically 2 (two) times daily as needed. 30 g 0   diltiazem (CARDIZEM CD) 240 MG 24 hr capsule Take 1 capsule (240 mg total) by mouth daily. 90 capsule 3   No current facility-administered medications for this visit.    Review of Systems  Constitutional:  Constitutional negative. HENT: HENT negative.  Eyes: Eyes negative.  Cardiovascular: Positive for leg swelling.  GI: Gastrointestinal negative.  Musculoskeletal: Musculoskeletal negative.  Skin: Skin negative.  Neurological: Neurological negative. Hematologic: Hematologic/lymphatic negative.  Psychiatric: Psychiatric negative.       Objective:  Objective   Vitals:   01/22/21 1443  BP: 108/61  Pulse:  62  Resp: 20  Temp: 97.6 F (36.4 C)  SpO2: 98%  Weight: 193 lb (87.5 kg)  Height: 4\' 11"  (1.499 m)   Body mass index is 38.98 kg/m.  Physical Exam HENT:     Head: Normocephalic.     Nose:     Comments: Wearing a mask Eyes:     Pupils: Pupils are equal, round, and reactive to light.  Cardiovascular:     Rate and Rhythm: Normal rate.     Pulses: Normal pulses.  Pulmonary:     Effort: Pulmonary effort is normal.  Abdominal:     General: Abdomen is flat.     Palpations: Abdomen is soft.  Musculoskeletal:        General: Normal range of motion.     Cervical back: Normal range of motion and neck supple.     Right lower leg: No edema.     Left lower leg: No edema.     Comments: Varicosities left medial thigh and leg 2 telangiectasias  left medial ankle as pictured  Skin:    General: Skin is warm.     Capillary Refill: Capillary refill takes less than 2 seconds.  Neurological:     General: No focal deficit present.     Mental Status: She is alert.  Psychiatric:        Mood and Affect: Mood normal.        Behavior: Behavior normal.        Thought Content: Thought content normal.        Judgment: Judgment normal.     Data: ABI Findings:  +---------+------------------+-----+---------+--------+  Right    Rt Pressure (mmHg)IndexWaveform Comment   +---------+------------------+-----+---------+--------+  Brachial 136                                       +---------+------------------+-----+---------+--------+  PTA      150               1.10 triphasic          +---------+------------------+-----+---------+--------+  DP       173               1.27 triphasic          +---------+------------------+-----+---------+--------+  Great Toe68                0.50                    +---------+------------------+-----+---------+--------+   +---------+------------------+-----+---------+-------+  Left     Lt Pressure (mmHg)IndexWaveform Comment  +---------+------------------+-----+---------+-------+  Brachial 130                                      +---------+------------------+-----+---------+-------+  PTA      155               1.14 triphasic         +---------+------------------+-----+---------+-------+  DP       149               1.10 triphasic         +---------+------------------+-----+---------+-------+  Great Toe76                0.56                   +---------+------------------+-----+---------+-------+   +-------+-----------+-----------+------------+------------+  ABI/TBIToday's ABIToday's TBIPrevious ABIPrevious TBI  +-------+-----------+-----------+------------+------------+  Right  1.27       0.50                                  +-------+-----------+-----------+------------+------------+  Left   1.14       0.56                                 +-------+-----------+-----------+------------+------------+          Summary:  Right: Resting right ankle-brachial index is within normal range. No  evidence of significant right lower extremity arterial disease. The right  toe-brachial index is abnormal.   Left: Resting left ankle-brachial index is within normal range. No  evidence of significant left lower extremity arterial disease. The left  toe-brachial index is abnormal.      Assessment/Plan:     80 year old female with apparent C3 venous disease with swelling of her left lower extremity and varicosities and what appears to be telangiectasias that are superficial on the left medial ankle and have bled in the past.  She had arterial study today with palpable pulses and arterial study was confirmed to be normal.  Given the varicosities in the greater saphenous vein distribution on the medial thigh and leg I will get a venous reflux study and have her back in a few weeks for evaluation.  I discussed with her the possible need for sclerotherapy and the possible need for compression socks with saphenous vein ablation plus or minus sclerotherapy.  All of her questions were answered today and I will see her back in a few weeks with venous reflux testing.     Waynetta Sandy MD [facility]

## 2021-01-23 ENCOUNTER — Other Ambulatory Visit: Payer: Self-pay

## 2021-01-23 DIAGNOSIS — I872 Venous insufficiency (chronic) (peripheral): Secondary | ICD-10-CM

## 2021-01-27 DIAGNOSIS — M25551 Pain in right hip: Secondary | ICD-10-CM | POA: Diagnosis not present

## 2021-02-26 ENCOUNTER — Encounter: Payer: Self-pay | Admitting: Vascular Surgery

## 2021-02-26 ENCOUNTER — Other Ambulatory Visit: Payer: Self-pay

## 2021-02-26 ENCOUNTER — Ambulatory Visit (HOSPITAL_COMMUNITY)
Admission: RE | Admit: 2021-02-26 | Discharge: 2021-02-26 | Disposition: A | Discharge status: Home / Self Care | Payer: PPO | Source: Ambulatory Visit | Attending: Vascular Surgery | Admitting: Vascular Surgery

## 2021-02-26 ENCOUNTER — Ambulatory Visit: Payer: PPO | Admitting: Vascular Surgery

## 2021-02-26 VITALS — BP 121/77 | HR 69 | Temp 97.9°F | Resp 20 | Ht 59.0 in | Wt 193.0 lb

## 2021-02-26 DIAGNOSIS — I872 Venous insufficiency (chronic) (peripheral): Secondary | ICD-10-CM | POA: Insufficient documentation

## 2021-02-26 NOTE — Progress Notes (Signed)
Patient ID: Gina Guerrero, female   DOB: 08-20-1940, 81 y.o.   MRN: [id number]  Reason for Consult: Follow-up   Referred by Simona Huh, NP  Subjective:     HPI:  Gina Guerrero is a 81 y.o. female initially sent for evaluation of abnormality of her left medial ankle where previously she has had bleeding.  Her ABIs were normal and she had palpable pulses.  She does have large varicosities which are uncomfortable and she has some tenderness and also swelling of the left greater than right lower extremity.  She had never worn compression stockings.  She now follows up today for further evaluation with venous reflux testing.  Past Medical History:  Diagnosis Date   Asthma    Cataract    Hyperlipidemia    Hypertension    Osteopenia determined by x-ray 11/2018   low FRAX score   Tachycardia    Family History  Problem Relation Age of Onset   Alopecia Father    Heart disease Sister    COPD Brother    Allergic rhinitis Neg Hx    Angioedema Neg Hx    Asthma Neg Hx    Eczema Neg Hx    Immunodeficiency Neg Hx    Urticaria Neg Hx    Past Surgical History:  Procedure Laterality Date   ABDOMINAL HYSTERECTOMY     EYE SURGERY Bilateral 2013   TOE SURGERY      Short Social History:  Social History   Tobacco Use   Smoking status: Never   Smokeless tobacco: Never  Substance Use Topics   Alcohol use: No    No Known Allergies  Current Outpatient Medications  Medication Sig Dispense Refill   albuterol (VENTOLIN HFA) 108 (90 Base) MCG/ACT inhaler Inhale 2 puffs into the lungs every 4 (four) hours as needed for wheezing or shortness of breath (cough, shortness of breath or wheezing.). 6.7 g 1   aspirin EC 81 MG tablet Take 81 mg by mouth daily.     atorvastatin (LIPITOR) 10 MG tablet TAKE 1 TABLET(10 MG) BY MOUTH DAILY 90 tablet 3   azelastine (ASTELIN) 0.1 % nasal spray Place 2 sprays into both nostrils 2 (two) times daily as needed for rhinitis. 30 mL 5    Cholecalciferol (VITAMIN D) 50 MCG (2000 UT) CAPS Take by mouth.     Multiple Vitamins-Minerals (PRESERVISION AREDS 2 PO) Take 1 tablet by mouth 2 (two) times daily.     propranolol (INDERAL) 40 MG tablet Take 1 tablet (40 mg total) by mouth 3 (three) times daily as needed (Heart racing). 30 tablet 3   triamcinolone cream (KENALOG) 0.1 % Apply 1 application topically 2 (two) times daily as needed. 30 g 0   diltiazem (CARDIZEM CD) 240 MG 24 hr capsule Take 1 capsule (240 mg total) by mouth daily. 90 capsule 3   No current facility-administered medications for this visit.    Review of Systems  Constitutional:  Constitutional negative. HENT: HENT negative.  Eyes: Eyes negative.  Cardiovascular: Positive for leg swelling.  GI: Gastrointestinal negative.  Musculoskeletal: Musculoskeletal negative.  Skin: Skin negative.  Neurological: Neurological negative. Psychiatric: Psychiatric negative.       Objective:  Objective   Vitals:   02/26/21 1329  BP: 121/77  Pulse: 69  Resp: 20  Temp: 97.9 F (36.6 C)  SpO2: 97%     Physical Exam HENT:     Head: Normocephalic.     Nose:     Comments: Wearing  a mask Eyes:     Pupils: Pupils are equal, round, and reactive to light.  Cardiovascular:     Rate and Rhythm: Normal rate.     Pulses: Normal pulses.  Pulmonary:     Effort: Pulmonary effort is normal.  Abdominal:     General: Abdomen is flat.     Palpations: Abdomen is soft.  Musculoskeletal:     Cervical back: Neck supple.     Comments: Large palpable varicosities left medial thigh and medial leg  Skin:    General: Skin is warm.     Capillary Refill: Capillary refill takes less than 2 seconds.  Neurological:     General: No focal deficit present.     Mental Status: She is alert.  Psychiatric:        Mood and Affect: Mood normal.        Behavior: Behavior normal.        Thought Content: Thought content normal.    Data:  LEFT               Reflux No Reflux Reflux  Time Diameter cms Comments                                            Yes                                                 +------------------+---------+------+-----------+------------+-------------  ----+   CFV                no                                                             +------------------+---------+------+-----------+------------+-------------  ----+   FV mid             no                                                             +------------------+---------+------+-----------+------------+-------------  ----+   Popliteal          no                                                             +------------------+---------+------+-----------+------------+-------------  ----+   GSV at [facility]                    yes     >500 ms       1.13                          +------------------+---------+------+-----------+------------+-------------  ----+   GSV prox thigh  yes     >500 ms       0.90                          +------------------+---------+------+-----------+------------+-------------  ----+   GSV mid thigh                 yes     >500 ms       0.91                          +------------------+---------+------+-----------+------------+-------------  ----+   GSV dist thigh                yes     >500 ms       0.87     large branch                                                                       feeding  cluster                                                                   of  varicosities     +------------------+---------+------+-----------+------------+-------------  ----+   GSV at knee                                                  lost in a  cluster                                                                 of  varicosities     +------------------+---------+------+-----------+------------+-------------  ----+   GSV prox calf                                                lost in a  cluster                                                                  of  varicosities     +------------------+---------+------+-----------+------------+-------------  ----+   GSV mid calf                  yes     >500 ms       0.63                          +------------------+---------+------+-----------+------------+-------------  ----+  SSV Pop Fossa      no                               0.34                          +------------------+---------+------+-----------+------------+-------------  ----+   posterior          no                               0.42                           accessory                                                                         +------------------+---------+------+-----------+------------+-------------  ----+           Summary:  Left:  - No evidence of deep vein thrombosis from the common femoral through the  popliteal veins.  - No evidence of superficial venous thrombosis.  - The deep venous system is competent.  - The great saphenous vein is not competent.  - The small saphenous vein is competent.      Assessment/Plan:     81 year old female with C3 venous disease with leg swelling and discomfort as well as multiple varicosities along the distribution of the great saphenous vein in the left lower extremity.  She also has 1 small area of superficial small vein that has bled in the past although no recent bleeding.  I discussed with her the need for thigh-high compression stockings.  We will fit her for those today and she will follow-up in 3 months to discuss symptom relief versus need to proceed with greater saphenous vein ablation and stab phlebectomy that would likely total greater than 20.  All of her questions were answered and she demonstrates very good understanding.     Waynetta Sandy MD [facility]

## 2021-03-14 DIAGNOSIS — M7989 Other specified soft tissue disorders: Secondary | ICD-10-CM

## 2021-04-07 DIAGNOSIS — H353131 Nonexudative age-related macular degeneration, bilateral, early dry stage: Secondary | ICD-10-CM | POA: Diagnosis not present

## 2021-04-07 DIAGNOSIS — H35373 Puckering of macula, bilateral: Secondary | ICD-10-CM | POA: Diagnosis not present

## 2021-04-07 DIAGNOSIS — H04123 Dry eye syndrome of bilateral lacrimal glands: Secondary | ICD-10-CM | POA: Diagnosis not present

## 2021-04-07 DIAGNOSIS — H524 Presbyopia: Secondary | ICD-10-CM | POA: Diagnosis not present

## 2021-04-07 DIAGNOSIS — H52223 Regular astigmatism, bilateral: Secondary | ICD-10-CM | POA: Diagnosis not present

## 2021-04-21 DIAGNOSIS — R350 Frequency of micturition: Secondary | ICD-10-CM | POA: Diagnosis not present

## 2021-04-21 DIAGNOSIS — R7303 Prediabetes: Secondary | ICD-10-CM | POA: Diagnosis not present

## 2021-04-21 DIAGNOSIS — Z0000 Encounter for general adult medical examination without abnormal findings: Secondary | ICD-10-CM | POA: Diagnosis not present

## 2021-04-21 DIAGNOSIS — Z9181 History of falling: Secondary | ICD-10-CM | POA: Diagnosis not present

## 2021-04-21 DIAGNOSIS — D539 Nutritional anemia, unspecified: Secondary | ICD-10-CM | POA: Diagnosis not present

## 2021-04-21 DIAGNOSIS — Z20822 Contact with and (suspected) exposure to covid-19: Secondary | ICD-10-CM | POA: Diagnosis not present

## 2021-04-21 DIAGNOSIS — Z79899 Other long term (current) drug therapy: Secondary | ICD-10-CM | POA: Diagnosis not present

## 2021-04-21 DIAGNOSIS — E7800 Pure hypercholesterolemia, unspecified: Secondary | ICD-10-CM | POA: Diagnosis not present

## 2021-04-21 DIAGNOSIS — R5383 Other fatigue: Secondary | ICD-10-CM | POA: Diagnosis not present

## 2021-04-21 DIAGNOSIS — M79604 Pain in right leg: Secondary | ICD-10-CM | POA: Diagnosis not present

## 2021-04-21 DIAGNOSIS — I4891 Unspecified atrial fibrillation: Secondary | ICD-10-CM | POA: Diagnosis not present

## 2021-04-21 DIAGNOSIS — M129 Arthropathy, unspecified: Secondary | ICD-10-CM | POA: Diagnosis not present

## 2021-04-21 DIAGNOSIS — E669 Obesity, unspecified: Secondary | ICD-10-CM | POA: Diagnosis not present

## 2021-04-21 DIAGNOSIS — E559 Vitamin D deficiency, unspecified: Secondary | ICD-10-CM | POA: Diagnosis not present

## 2021-05-02 IMAGING — MG DIGITAL SCREENING BILAT W/ TOMO W/ CAD
6 of 10 series · 6 of 30 positions shown · non-contrast
Comparison: None.

CLINICAL DATA: Screening.

EXAM:
DIGITAL SCREENING BILATERAL MAMMOGRAM WITH TOMO AND CAD

[L MLO synth-2D (1 of 2)]
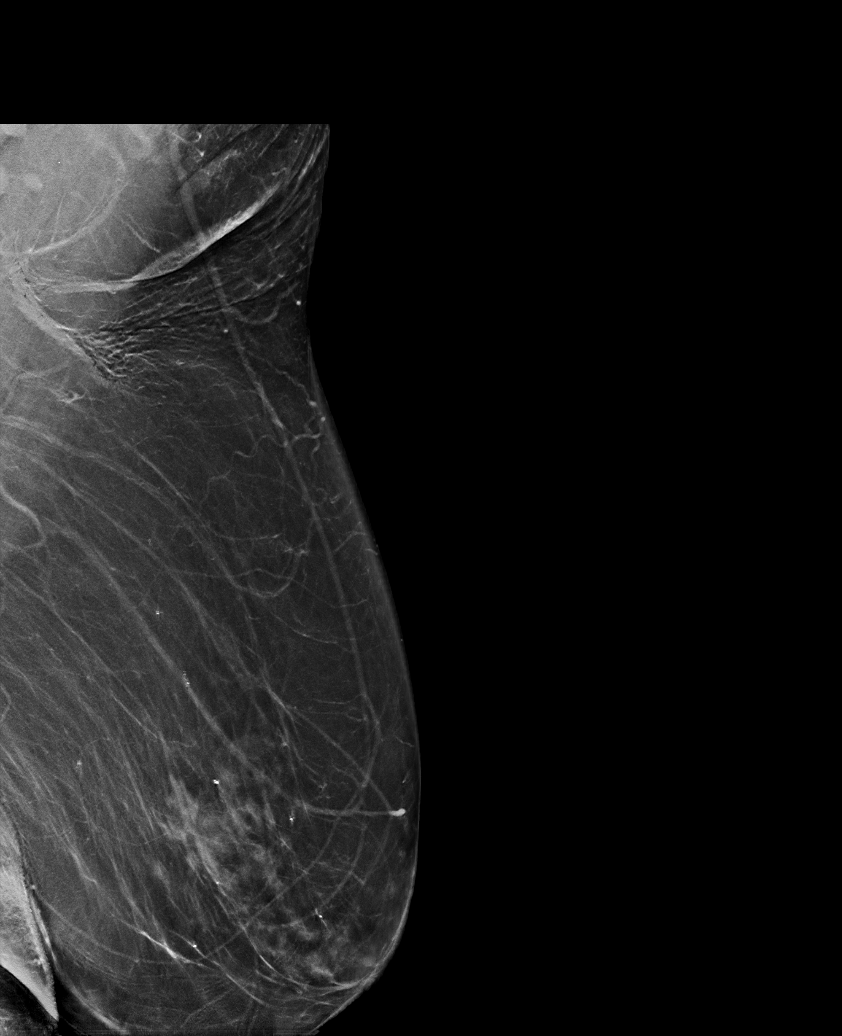

[R CC synth-2D]
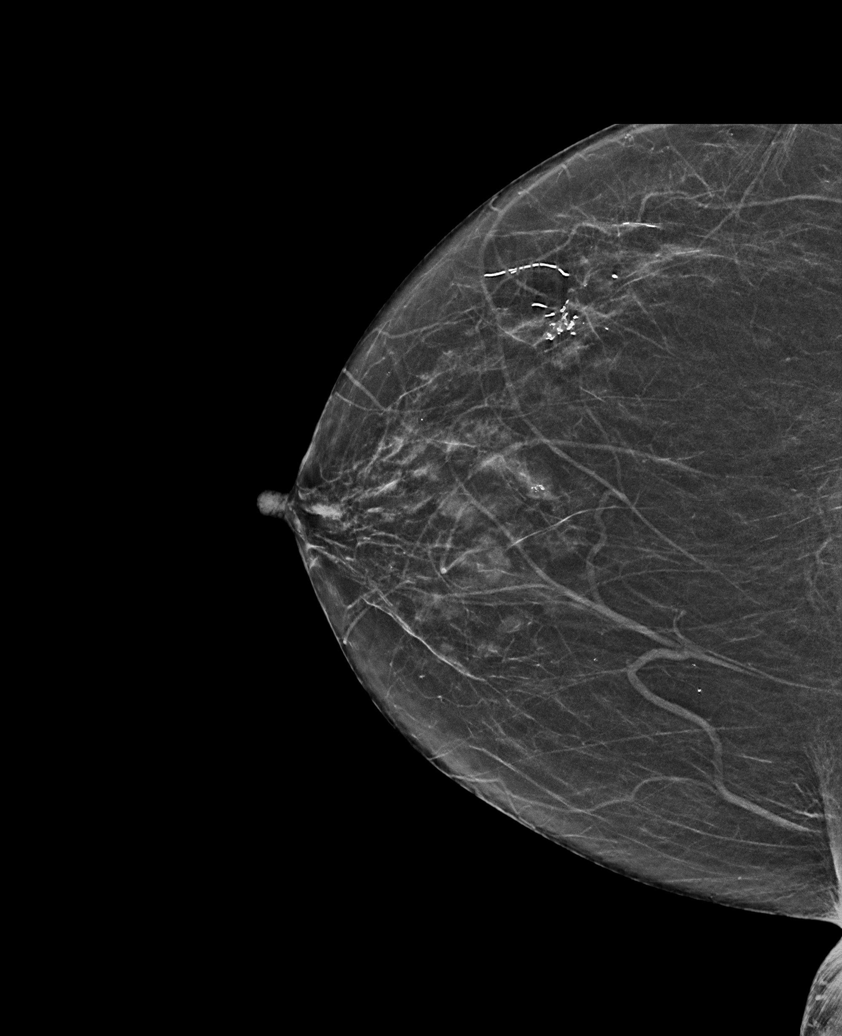

[L MLO synth-2D (2 of 2)]
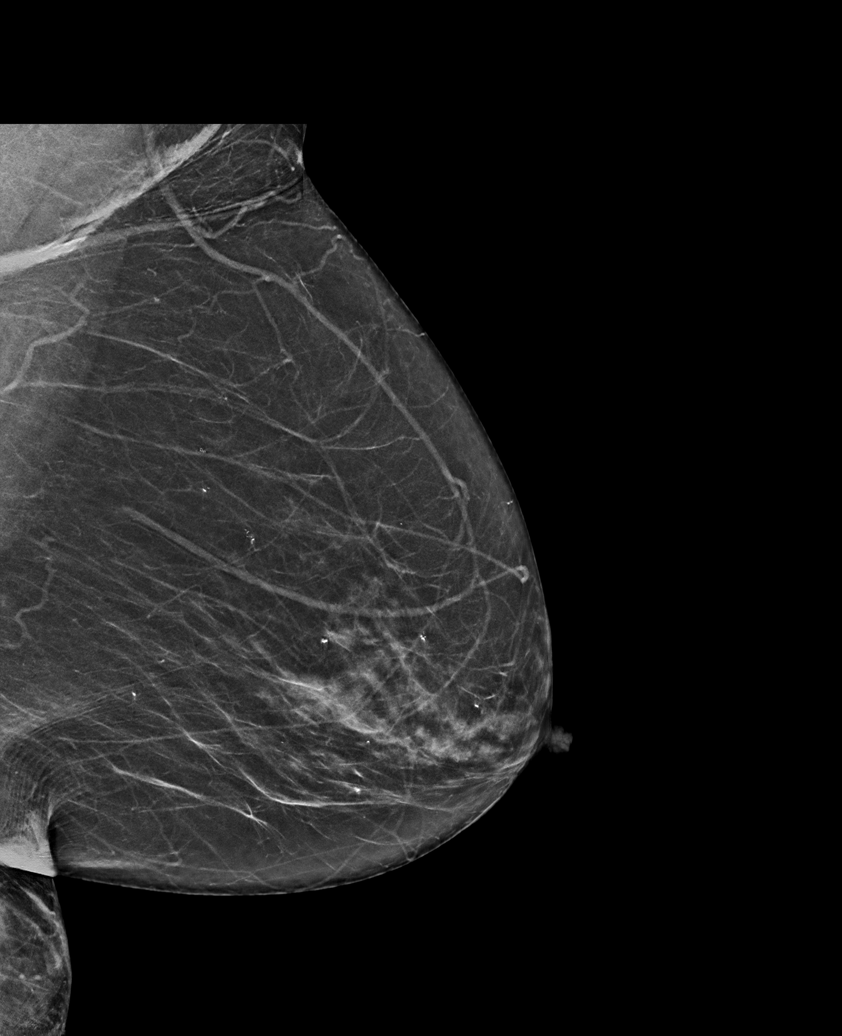

[L CC synth-2D]
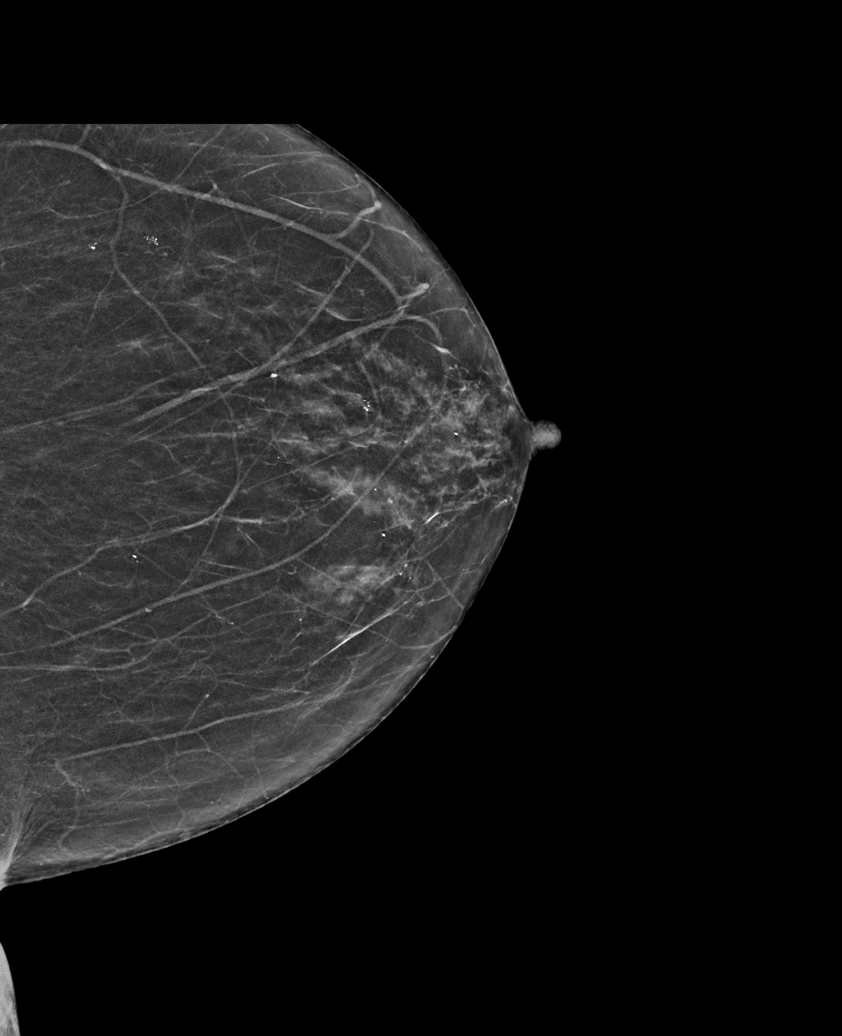

[R MLO synth-2D]
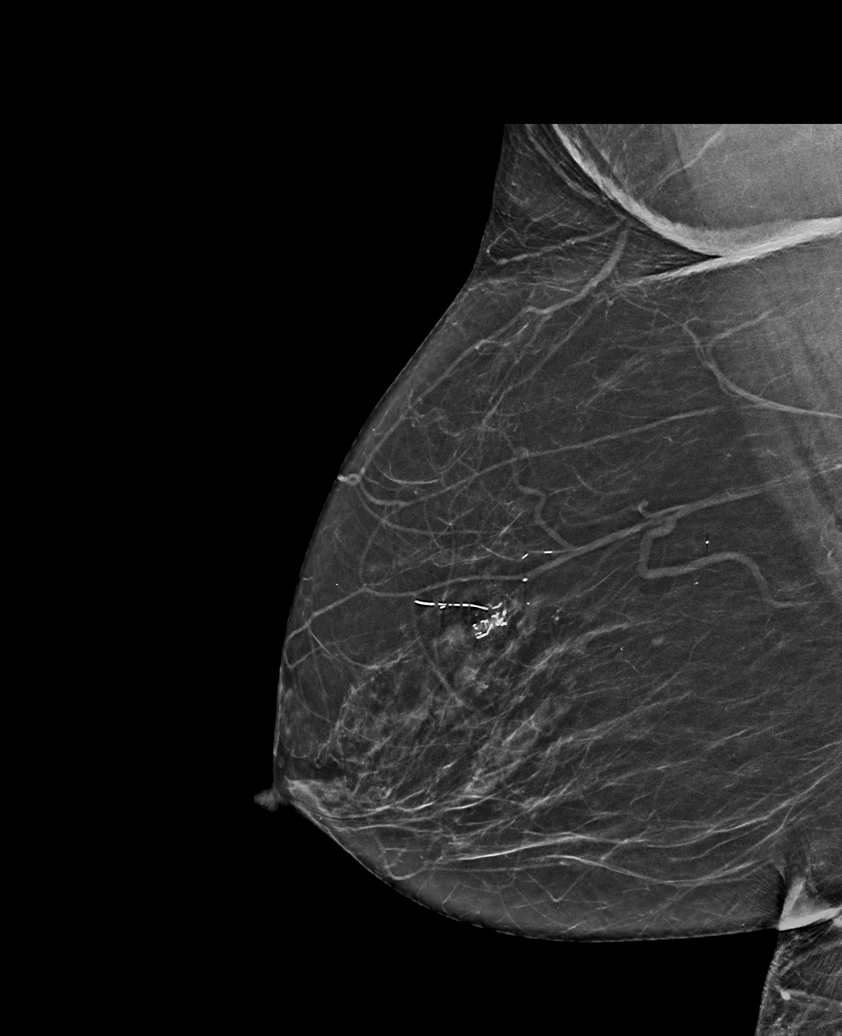

[L MLO tomo · tomo slice 39/78.0]
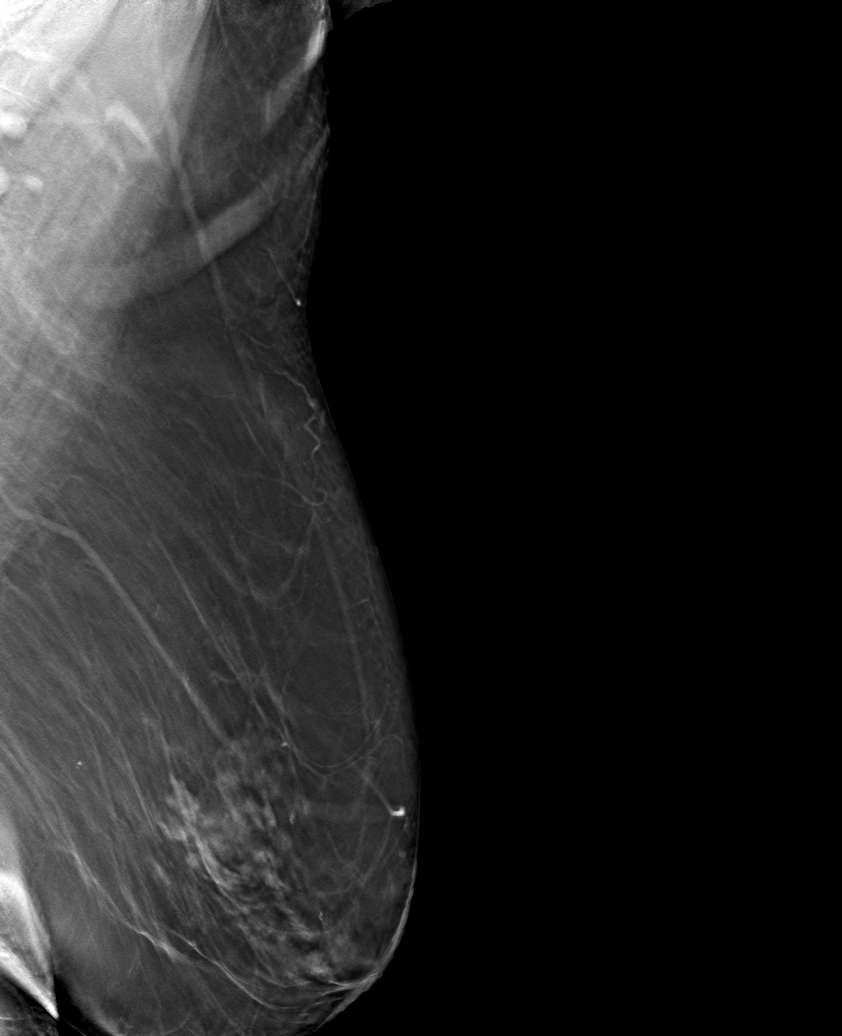

[6 of 30 positions shown; findings below may reference images not displayed]

ACR Breast Density Category b: There are scattered areas of
fibroglandular density.
FINDINGS: There are no findings suspicious for malignancy. Images were
processed with CAD.
IMPRESSION: No mammographic evidence of malignancy. A result letter of this
screening mammogram will be mailed directly to the patient.

RECOMMENDATION:
Screening mammogram in one year. (Code:Y5-G-EJ6)

BI-RADS CATEGORY  1: Negative.

## 2021-05-28 ENCOUNTER — Ambulatory Visit: Payer: PPO | Admitting: Vascular Surgery

## 2021-06-13 DIAGNOSIS — L821 Other seborrheic keratosis: Secondary | ICD-10-CM | POA: Diagnosis not present

## 2021-06-13 DIAGNOSIS — L659 Nonscarring hair loss, unspecified: Secondary | ICD-10-CM | POA: Diagnosis not present

## 2021-06-13 DIAGNOSIS — D2372 Other benign neoplasm of skin of left lower limb, including hip: Secondary | ICD-10-CM | POA: Diagnosis not present

## 2021-08-06 ENCOUNTER — Encounter: Payer: Self-pay | Admitting: Emergency Medicine

## 2021-08-06 ENCOUNTER — Ambulatory Visit (INDEPENDENT_AMBULATORY_CARE_PROVIDER_SITE_OTHER): Payer: PPO | Admitting: Emergency Medicine

## 2021-08-06 VITALS — BP 136/84 | HR 76 | Temp 97.8°F | Ht 59.0 in | Wt 194.1 lb

## 2021-08-06 DIAGNOSIS — E785 Hyperlipidemia, unspecified: Secondary | ICD-10-CM

## 2021-08-06 DIAGNOSIS — Z7689 Persons encountering health services in other specified circumstances: Secondary | ICD-10-CM | POA: Diagnosis not present

## 2021-08-06 DIAGNOSIS — L239 Allergic contact dermatitis, unspecified cause: Secondary | ICD-10-CM

## 2021-08-06 DIAGNOSIS — I10 Essential (primary) hypertension: Secondary | ICD-10-CM | POA: Insufficient documentation

## 2021-08-06 MED ORDER — TRIAMCINOLONE ACETONIDE 0.1 % EX CREA: 1.0000 "application " | TOPICAL_CREAM | Freq: Two times a day (BID) | CUTANEOUS | 1 refills | Status: DC

## 2021-08-06 NOTE — Assessment & Plan Note (Signed)
Patient thought it was poison ivy.  Recommend triamcinolone twice a day for 7 days.

## 2021-08-06 NOTE — Progress Notes (Signed)
Gina Guerrero 81 y.o.   Chief Complaint  Patient presents with   New Patient (Initial Visit)   Rash    Right leg    HISTORY OF PRESENT ILLNESS: This is a 82 y.o. female first visit to this office, here to establish care with me. Only complaint today is itchy rash to right lower leg. Chronic medical problems include hypertension, dyslipidemia and possible asthma. Overall doing well.  Stable.  No other complaints or medical concerns today.  Rash Associated symptoms include coughing. Pertinent negatives include no congestion, diarrhea, fever, shortness of breath, sore throat or vomiting.     Prior to Admission medications   Medication Sig Start Date End Date Taking? Authorizing Provider  albuterol (VENTOLIN HFA) 108 (90 Base) MCG/ACT inhaler Inhale 2 puffs into the lungs every 4 (four) hours as needed for wheezing or shortness of breath (cough, shortness of breath or wheezing.). 08/26/18   Law, Bea Graff, PA-C  aspirin EC 81 MG tablet Take 81 mg by mouth daily.    [provider]  atorvastatin (LIPITOR) 10 MG tablet TAKE 1 TABLET(10 MG) BY MOUTH DAILY 01/23/20   Just, Laurita Quint, FNP  azelastine (ASTELIN) 0.1 % nasal spray Place 2 sprays into both nostrils 2 (two) times daily as needed for rhinitis. 03/02/19   Valentina Shaggy, MD  Cholecalciferol (VITAMIN D) 50 MCG (2000 UT) CAPS Take by mouth.    [provider]  diltiazem (CARDIZEM CD) 240 MG 24 hr capsule Take 1 capsule (240 mg total) by mouth daily. 01/23/20 01/17/21  Just, Laurita Quint, FNP  Multiple Vitamins-Minerals (PRESERVISION AREDS 2 PO) Take 1 tablet by mouth 2 (two) times daily.    [provider]  propranolol (INDERAL) 40 MG tablet Take 1 tablet (40 mg total) by mouth 3 (three) times daily as needed (Heart racing). 11/22/20   Adrian Prows, MD  triamcinolone cream (KENALOG) 0.1 % Apply 1 application topically 2 (two) times daily as needed. 06/15/19   Daleen Squibb, MD    No Known  Allergies  Patient Active Problem List   Diagnosis Date Noted   Prediabetes 04/23/2020   Mixed hyperlipidemia 04/23/2020   Osteopenia determined by x-ray 11/2018   SVT (supraventricular tachycardia) ([place]) 09/14/2018   Perennial allergic rhinitis 08/30/2018   Chronic cough 05/13/2016    Past Medical History:  Diagnosis Date   Asthma    Cataract    Hyperlipidemia    Hypertension    Osteopenia determined by x-ray 11/2018   low FRAX score   Tachycardia     Past Surgical History:  Procedure Laterality Date   ABDOMINAL HYSTERECTOMY     EYE SURGERY Bilateral 2013   TOE SURGERY      Social History   Socioeconomic History   Marital status: Married    Spouse name: Not on file   Number of children: 0   Years of education: Not on file   Highest education level: Not on file  Occupational History   Not on file  Tobacco Use   Smoking status: Never   Smokeless tobacco: Never  Vaping Use   Vaping Use: Never used  Substance and Sexual Activity   Alcohol use: No   Drug use: Never   Sexual activity: Not on file  Other Topics Concern   Not on file  Social History Narrative   Lives with husband and is currently taking care of her niece who is dying of brain cancer.     No children.  Social Determinants of Health   Financial Resource Strain: Not on file  Food Insecurity: Not on file  Transportation Needs: Not on file  Physical Activity: Not on file  Stress: Not on file  Social Connections: Not on file  Intimate Partner Violence: Not on file    Family History  Problem Relation Age of Onset   Alopecia Father    Heart disease Sister    COPD Brother    Allergic rhinitis Neg Hx    Angioedema Neg Hx    Asthma Neg Hx    Eczema Neg Hx    Immunodeficiency Neg Hx    Urticaria Neg Hx      Review of Systems  Constitutional: Negative.  Negative for chills and fever.  HENT: Negative.  Negative for congestion and sore throat.   Respiratory:  Positive for cough. Negative  for shortness of breath.   Cardiovascular: Negative.  Negative for chest pain.  Gastrointestinal:  Negative for abdominal pain, diarrhea, nausea and vomiting.  Genitourinary: Negative.  Negative for hematuria.  Musculoskeletal: Negative.   Skin:  Positive for rash.  Neurological: Negative.  Negative for dizziness and headaches.   Today's Vitals   08/06/21 1300  BP: 136/84  Pulse: 76  Temp: 97.8 F (36.6 C)  TempSrc: Oral  SpO2: 98%  Weight: 194 lb 2 oz (88.1 kg)  Height: '4\' 11"'$  (1.499 m)   Body mass index is 39.21 kg/m.   Physical Exam Vitals reviewed.  Constitutional:      Appearance: Normal appearance.  HENT:     Head: Normocephalic.  Eyes:     Extraocular Movements: Extraocular movements intact.     Pupils: Pupils are equal, round, and reactive to light.  Cardiovascular:     Rate and Rhythm: Normal rate and regular rhythm.     Pulses: Normal pulses.     Heart sounds: Normal heart sounds.  Pulmonary:     Effort: Pulmonary effort is normal.     Breath sounds: Normal breath sounds.  Abdominal:     Palpations: Abdomen is soft.     Tenderness: There is no abdominal tenderness.  Musculoskeletal:     Cervical back: No tenderness.  Lymphadenopathy:     Cervical: No cervical adenopathy.  Skin:    General: Skin is warm and dry.     Capillary Refill: Capillary refill takes less than 2 seconds.     Findings: Rash present.     Comments: Erythematous flat rash to right anterior lower leg.  No skin breakdowns.  No cellulitis.  No blisters.  Neurological:     General: No focal deficit present.     Mental Status: She is alert and oriented to person, place, and time.  Psychiatric:        Mood and Affect: Mood normal.        Behavior: Behavior normal.      ASSESSMENT & PLAN: A total of 47 minutes was spent with the patient and counseling/coordination of care regarding preparing for this visit, establishing care with me, review of available medical records, review of  multiple chronic medical problems under management, review of all medications, review of most recent blood work results, comprehensive history and physical examination, prognosis, documentation, and need for follow-up.  Problem List Items Addressed This Visit       Cardiovascular and Mediastinum   Essential hypertension - Primary    Well-controlled hypertension.  Continue present medications.  No changes Continue diltiazem 240 mg daily and propranolol 40 mg 3 times  a day. Has been on these medications for a long time. BP Readings from Last 3 Encounters:  08/06/21 136/84  02/26/21 121/77  01/22/21 108/61           Musculoskeletal and Integument   Allergic dermatitis    Patient thought it was poison ivy.  Recommend triamcinolone twice a day for 7 days.      Relevant Medications   triamcinolone cream (KENALOG) 0.1 %     Other   Dyslipidemia    Stable.  Diet and nutrition discussed.  Continue atorvastatin 10 mg daily.      Other Visit Diagnoses     Encounter to establish care          Patient Instructions  Contact Dermatitis Dermatitis is redness, soreness, and swelling (inflammation) of the skin. Contact dermatitis is a reaction to certain substances that touch the skin. Many different substances can cause contact dermatitis. There are two types of contact dermatitis: Irritant contact dermatitis. This type is caused by something that irritates your skin, such as having dry hands from washing them too often with soap. This type does not require previous exposure to the substance for a reaction to occur. This is the most common type. Allergic contact dermatitis. This type is caused by a substance that you are allergic to, such as poison ivy. This type occurs when you have been exposed to the substance (allergen) and develop a sensitivity to it. Dermatitis may develop soon after your first exposure to the allergen, or it may not develop until the next time you are exposed and  every time thereafter. What are the causes? Irritant contact dermatitis is most commonly caused by exposure to: Makeup. Soaps. Detergents. Bleaches. Acids. Metal salts, such as nickel. Allergic contact dermatitis is most commonly caused by exposure to: Poisonous plants. Chemicals. Jewelry. Latex. Medicines. Preservatives in products, such as clothing. What increases the risk? You are more likely to develop this condition if you have: A job that exposes you to irritants or allergens. Certain medical conditions, such as asthma or eczema. What are the signs or symptoms? Symptoms of this condition may occur on your body anywhere the irritant has touched you or is touched by you. Symptoms include: Dryness or flaking. Redness. Cracks. Itching. Pain or a burning feeling. Blisters. Drainage of small amounts of blood or clear fluid from skin cracks. With allergic contact dermatitis, there may also be swelling in areas such as the eyelids, mouth, or genitals. How is this diagnosed? This condition is diagnosed with a medical history and physical exam. A patch skin test may be performed to help determine the cause. If the condition is related to your job, you may need to see an occupational medicine specialist. How is this treated? This condition is treated by checking for the cause of the reaction and protecting your skin from further contact. Treatment may also include: Steroid creams or ointments. Oral steroid medicines may be needed in more severe cases. Antibiotic medicines or antibacterial ointments, if a skin infection is present. Antihistamine lotion or an antihistamine taken by mouth to ease itching. A bandage (dressing). Follow these instructions at home: Skin care Moisturize your skin as needed. Apply cool compresses to the affected areas. Try applying baking soda paste to your skin. Stir water into baking soda until it reaches a paste-like consistency. Do not scratch  your skin, and avoid friction to the affected area. Avoid the use of soaps, perfumes, and dyes. Medicines Take or apply over-the-counter and prescription medicines  only as told by your health care provider. If you were prescribed an antibiotic medicine, take or apply the antibiotic as told by your health care provider. Do not stop using the antibiotic even if your condition improves. Bathing Try taking a bath with: Epsom salts. Follow the instructions on the packaging. You can get these at your local pharmacy or grocery store. Baking soda. Pour a small amount into the bath as directed by your health care provider. Colloidal oatmeal. Follow the instructions on the packaging. You can get this at your local pharmacy or grocery store. Bathe less frequently, such as every other day. Bathe in lukewarm water. Avoid using hot water. Bandage care If you were given a bandage (dressing), change it as told by your health care provider. Wash your hands with soap and water before and after you change your dressing. If soap and water are not available, use hand sanitizer. General instructions Avoid the substance that caused your reaction. If you do not know what caused it, keep a journal to try to track what caused it. Write down: What you eat. What cosmetic products you use. What you drink. What you wear in the affected area. This includes jewelry. Check the affected areas every day for signs of infection. Check for: More redness, swelling, or pain. More fluid or blood. Warmth. Pus or a bad smell. Keep all follow-up visits as told by your health care provider. This is important. Contact a health care provider if: Your condition does not improve with treatment. Your condition gets worse. You have signs of infection such as swelling, tenderness, redness, soreness, or warmth in the affected area. You have a fever. You have new symptoms. Get help right away if: You have a severe headache, neck pain,  or neck stiffness. You vomit. You feel very sleepy. You notice red streaks coming from the affected area. Your bone or joint underneath the affected area becomes painful after the skin has healed. The affected area turns darker. You have difficulty breathing. Summary Dermatitis is redness, soreness, and swelling (inflammation) of the skin. Contact dermatitis is a reaction to certain substances that touch the skin. Symptoms of this condition may occur on your body anywhere the irritant has touched you or is touched by you. This condition is treated by figuring out what caused the reaction and protecting your skin from further contact. Treatment may also include medicines and skin care. Avoid the substance that caused your reaction. If you do not know what caused it, keep a journal to try to track what caused it. Contact a health care provider if your condition gets worse or you have signs of infection such as swelling, tenderness, redness, soreness, or warmth in the affected area. This information is not intended to replace advice given to you by your health care provider. Make sure you discuss any questions you have with your health care provider. Document Revised: 11/25/2020 Document Reviewed: 11/25/2020 Elsevier Patient Education  [place], MD [facility]

## 2021-08-06 NOTE — Assessment & Plan Note (Signed)
Stable.  Diet and nutrition discussed.  Continue atorvastatin 10 mg daily. 

## 2021-08-06 NOTE — Assessment & Plan Note (Signed)
Well-controlled hypertension.  Continue present medications.  No changes Continue diltiazem 240 mg daily and propranolol 40 mg 3 times a day. Has been on these medications for a long time. BP Readings from Last 3 Encounters:  08/06/21 136/84  02/26/21 121/77  01/22/21 108/61

## 2021-08-06 NOTE — Patient Instructions (Signed)
Contact Dermatitis Dermatitis is redness, soreness, and swelling (inflammation) of the skin. Contact dermatitis is a reaction to certain substances that touch the skin. Many different substances can cause contact dermatitis. There are two types of contact dermatitis: Irritant contact dermatitis. This type is caused by something that irritates your skin, such as having dry hands from washing them too often with soap. This type does not require previous exposure to the substance for a reaction to occur. This is the most common type. Allergic contact dermatitis. This type is caused by a substance that you are allergic to, such as poison ivy. This type occurs when you have been exposed to the substance (allergen) and develop a sensitivity to it. Dermatitis may develop soon after your first exposure to the allergen, or it may not develop until the next time you are exposed and every time thereafter. What are the causes? Irritant contact dermatitis is most commonly caused by exposure to: Makeup. Soaps. Detergents. Bleaches. Acids. Metal salts, such as nickel. Allergic contact dermatitis is most commonly caused by exposure to: Poisonous plants. Chemicals. Jewelry. Latex. Medicines. Preservatives in products, such as clothing. What increases the risk? You are more likely to develop this condition if you have: A job that exposes you to irritants or allergens. Certain medical conditions, such as asthma or eczema. What are the signs or symptoms? Symptoms of this condition may occur on your body anywhere the irritant has touched you or is touched by you. Symptoms include: Dryness or flaking. Redness. Cracks. Itching. Pain or a burning feeling. Blisters. Drainage of small amounts of blood or clear fluid from skin cracks. With allergic contact dermatitis, there may also be swelling in areas such as the eyelids, mouth, or genitals. How is this diagnosed? This condition is diagnosed with a medical  history and physical exam. A patch skin test may be performed to help determine the cause. If the condition is related to your job, you may need to see an occupational medicine specialist. How is this treated? This condition is treated by checking for the cause of the reaction and protecting your skin from further contact. Treatment may also include: Steroid creams or ointments. Oral steroid medicines may be needed in more severe cases. Antibiotic medicines or antibacterial ointments, if a skin infection is present. Antihistamine lotion or an antihistamine taken by mouth to ease itching. A bandage (dressing). Follow these instructions at home: Skin care Moisturize your skin as needed. Apply cool compresses to the affected areas. Try applying baking soda paste to your skin. Stir water into baking soda until it reaches a paste-like consistency. Do not scratch your skin, and avoid friction to the affected area. Avoid the use of soaps, perfumes, and dyes. Medicines Take or apply over-the-counter and prescription medicines only as told by your health care provider. If you were prescribed an antibiotic medicine, take or apply the antibiotic as told by your health care provider. Do not stop using the antibiotic even if your condition improves. Bathing Try taking a bath with: Epsom salts. Follow the instructions on the packaging. You can get these at your local pharmacy or grocery store. Baking soda. Pour a small amount into the bath as directed by your health care provider. Colloidal oatmeal. Follow the instructions on the packaging. You can get this at your local pharmacy or grocery store. Bathe less frequently, such as every other day. Bathe in lukewarm water. Avoid using hot water. Bandage care If you were given a bandage (dressing), change it as told   by your health care provider. Wash your hands with soap and water before and after you change your dressing. If soap and water are not  available, use hand sanitizer. General instructions Avoid the substance that caused your reaction. If you do not know what caused it, keep a journal to try to track what caused it. Write down: What you eat. What cosmetic products you use. What you drink. What you wear in the affected area. This includes jewelry. Check the affected areas every day for signs of infection. Check for: More redness, swelling, or pain. More fluid or blood. Warmth. Pus or a bad smell. Keep all follow-up visits as told by your health care provider. This is important. Contact a health care provider if: Your condition does not improve with treatment. Your condition gets worse. You have signs of infection such as swelling, tenderness, redness, soreness, or warmth in the affected area. You have a fever. You have new symptoms. Get help right away if: You have a severe headache, neck pain, or neck stiffness. You vomit. You feel very sleepy. You notice red streaks coming from the affected area. Your bone or joint underneath the affected area becomes painful after the skin has healed. The affected area turns darker. You have difficulty breathing. Summary Dermatitis is redness, soreness, and swelling (inflammation) of the skin. Contact dermatitis is a reaction to certain substances that touch the skin. Symptoms of this condition may occur on your body anywhere the irritant has touched you or is touched by you. This condition is treated by figuring out what caused the reaction and protecting your skin from further contact. Treatment may also include medicines and skin care. Avoid the substance that caused your reaction. If you do not know what caused it, keep a journal to try to track what caused it. Contact a health care provider if your condition gets worse or you have signs of infection such as swelling, tenderness, redness, soreness, or warmth in the affected area. This information is not intended to replace  advice given to you by your health care provider. Make sure you discuss any questions you have with your health care provider. Document Revised: 11/25/2020 Document Reviewed: 11/25/2020 Elsevier Patient Education  2023 [employer] Inc[employer]  

## 2021-11-24 ENCOUNTER — Encounter: Payer: Self-pay | Admitting: Cardiology

## 2021-11-24 ENCOUNTER — Ambulatory Visit: Payer: PPO | Admitting: Cardiology

## 2021-11-24 VITALS — BP 128/80 | HR 70 | Temp 98.1°F | Resp 16 | Ht 59.0 in | Wt 194.0 lb

## 2021-11-24 DIAGNOSIS — I10 Essential (primary) hypertension: Secondary | ICD-10-CM | POA: Diagnosis not present

## 2021-11-24 DIAGNOSIS — I471 Supraventricular tachycardia, unspecified: Secondary | ICD-10-CM | POA: Diagnosis not present

## 2021-11-24 NOTE — Progress Notes (Signed)
Primary Physician/Referring:  Horald Pollen, MD  Patient ID: Gina Guerrero, female    DOB: Apr 12, 1940, 81 y.o.   MRN: [id number]  Chief Complaint  Patient presents with   Hypertension   PSVT   Follow-up    1 year   HPI:    HPI: Gina Guerrero  is a 81 y.o. [facility] patient with  medical history is significant for asthma, Hyperlipidemia, morbid obesity, venous insufficiency and varicose veins of the lower extremity, PSVT with a heart rate of 193 beats minute when seen in the emergency room on 08/26/2018 and spontaneously converted to sinus rhythm after IV diltiazem.    The patient presents for annual visit of PSVT.  She has occasional episodes of palpitations with relief with Valsalva maneuver.  Otherwise states that she is doing well and remains asymptomatic.     Past Medical History:  Diagnosis Date   Asthma    Cataract    Hyperlipidemia    Hypertension    Osteopenia determined by x-ray 11/2018   low FRAX score   Tachycardia    Past Surgical History:  Procedure Laterality Date   ABDOMINAL HYSTERECTOMY     EYE SURGERY Bilateral 2013   TOE SURGERY     Social History   Tobacco Use   Smoking status: Never   Smokeless tobacco: Never  Substance Use Topics   Alcohol use: No    ROS  Review of Systems  Cardiovascular:  Positive for palpitations. Negative for chest pain, dyspnea on exertion and leg swelling.  Gastrointestinal:  Negative for melena.   Objective  Blood pressure 128/80, pulse 70, temperature 98.1 F (36.7 C), resp. rate 16, height '4\' 11"'$  (1.499 m), weight 194 lb (88 kg), SpO2 97 %. Body mass index is 39.18 kg/m.      11/24/2021    1:40 PM 08/06/2021    1:00 PM 02/26/2021    1:29 PM  Vitals with BMI  Height '4\' 11"'$  '4\' 11"'$  '4\' 11"'$   Weight 194 lbs 194 lbs 2 oz 193 lbs  BMI 39.16 44.81 85.63  Systolic 149 702 637  Diastolic 80 84 77  Pulse 70 76 69    Physical Exam Constitutional:      Appearance: She is obese.  Cardiovascular:     Rate and  Rhythm: Normal rate and regular rhythm.     Pulses: Intact distal pulses.     Heart sounds: Normal heart sounds. No murmur heard.    No gallop.     Comments: No leg edema, no JVD. Mild superficial varicosities on bilateral legs. Pulmonary:     Effort: Pulmonary effort is normal.     Breath sounds: Normal breath sounds.  Abdominal:     General: Bowel sounds are normal.     Palpations: Abdomen is soft.    Radiology: No results found.  Laboratory examination:      Latest Ref Rng & Units 04/23/2020   12:00 PM 01/23/2020   12:05 PM 06/15/2019   11:25 AM  CMP  Glucose 65 - 99 mg/dL 84  104  87   BUN 8 - 27 mg/dL '18  17  20   '$ Creatinine 0.57 - 1.00 mg/dL 0.79  0.72  0.83   Sodium 134 - 144 mmol/L 147  142  142   Potassium 3.5 - 5.2 mmol/L 4.6  4.3  4.3   Chloride 96 - 106 mmol/L 107  103  104   CO2 20 - 29 mmol/L 24  27  26  Calcium 8.7 - 10.3 mg/dL 9.4  9.5  9.5   Total Protein 6.0 - 8.5 g/dL 6.8  7.0  7.2   Total Bilirubin 0.0 - 1.2 mg/dL 0.6  0.8  0.6   Alkaline Phos 44 - 121 IU/L 98  87  99   AST 0 - 40 IU/L '15  20  18   '$ ALT 0 - 32 IU/L '14  21  17       '$ Latest Ref Rng & Units 08/26/2018   10:24 AM 03/26/2016    3:20 PM 02/13/2016    3:22 PM  CBC  WBC 4.0 - 10.5 K/uL 10.1  7.9  8.1   Hemoglobin 12.0 - 15.0 g/dL 14.1  14.2  14.1   Hematocrit 36.0 - 46.0 % 42.5  42.1  41.1   Platelets 150 - 400 K/uL 154  237     Lab Results  Component Value Date   CHOL 158 04/23/2020   HDL 54 04/23/2020   LDLCALC 88 04/23/2020   TRIG 84 04/23/2020   CHOLHDL 2.9 04/23/2020    HEMOGLOBIN A1C Lab Results  Component Value Date   HGBA1C 5.8 (H) 04/23/2020   TSH No results for input(s): "TSH" in the last 8760 hours.  Medications   No Known Allergies  Current Outpatient Medications on File Prior to Visit  Medication Sig Dispense Refill   albuterol (VENTOLIN HFA) 108 (90 Base) MCG/ACT inhaler Inhale 2 puffs into the lungs every 4 (four) hours as needed for wheezing or shortness of  breath (cough, shortness of breath or wheezing.). 6.7 g 1   aspirin EC 81 MG tablet Take 81 mg by mouth daily.     azelastine (ASTELIN) 0.1 % nasal spray Place 2 sprays into both nostrils 2 (two) times daily as needed for rhinitis. 30 mL 5   Cholecalciferol (VITAMIN D) 50 MCG (2000 UT) CAPS Take by mouth.     cyclobenzaprine (FLEXERIL) 5 MG tablet Take 5 mg by mouth 3 (three) times daily as needed for muscle spasms.     Multiple Vitamins-Minerals (PRESERVISION AREDS 2 PO) Take 1 tablet by mouth 2 (two) times daily.     propranolol (INDERAL) 40 MG tablet Take 1 tablet (40 mg total) by mouth 3 (three) times daily as needed (Heart racing). 30 tablet 3   triamcinolone cream (KENALOG) 0.1 % Apply 1 application topically 2 (two) times daily as needed. 30 g 0   triamcinolone cream (KENALOG) 0.1 % Apply 1 application  topically 2 (two) times daily. 30 g 1   diltiazem (CARDIZEM CD) 240 MG 24 hr capsule Take 1 capsule (240 mg total) by mouth daily. (Patient not taking: Reported on 11/24/2021) 90 capsule 3   No current facility-administered medications on file prior to visit.     Cardiac Studies:   Event Monitor for 30 days Start date 11/16/2018:  The patient's monitoring period was 11/16/2018 - 12/15/2018. Baseline sample showed Sinus Rhythm w/Artifact with a heart rate of 71.7 bpm. There were 4 critical and 9 stable events that occurred. The triggered events included occasional PVC. One symptom corelated with SVT @ 160/min.  There were 4 sustained events and longest lasted 9 hours and 10 min and shortest 4 hours and 23 minutes. Symptoms included fatigue and no symptom or accidental push.  Lexiscan Tetrofosmin Stress Test  05/20/2019: Nondiagnostic ECG stress. Mild degree small extent perfusion defect consistent with breast attenuation in the inferior wall. A small sized mild ischemia cannot be completely excluded.  Stress LV  EF: 50%.  No previous exam available for comparison. Low risk study.   PCV  ECHOCARDIOGRAM COMPLETE 12/01/2019  Narrative Echocardiogram 12/01/2019: Normal LV systolic function with visual EF 50-55%. Left ventricle cavity is normal in size. Normal global wall motion. Normal diastolic filling pattern, normal LAP. Mild (Grade I) aortic regurgitation. Mild tricuspid regurgitation. Compared to prior study dated 10/14/2019: No significant change noted.   EKG:  EKG 11/24/2021: Normal sinus rhythm at rate of 78 bpm, left axis deviation, left anterior fascicular block.  Right bundle scratch that.  Incomplete right bundle branch block.  Poor R progression, cannot exclude anteroseptal infarct old.  No significant change from 11/22/2020.   EKG 08/26/2018: SVT at rate of 193 bpm, left axis deviation, incomplete right bundle branch block.  Nonspecific T abnormality. Repeat EKG reveals probable ectopic atrial rhythm with short PR interval, leftward axis, poor R-wave progression, incomplete right bundle branch block.  Diffuse nonspecific T abnormality, cannot exclude inferior and anterolateral ischemia.  ST T abnormalities may have been due to either demand ischemia or memory effect S/P SVT examination.  Assessment     ICD-10-CM   1. PSVT (paroxysmal supraventricular tachycardia)  I47.10 EKG 12-Lead    2. Primary hypertension  I10        No orders of the defined types were placed in this encounter.   Medications Discontinued During This Encounter  Medication Reason   atorvastatin (LIPITOR) 10 MG tablet    Recommendations:   Gina Guerrero is a 81 y.o. [facility] patient with  medical history is significant for asthma, Hyperlipidemia, morbid obesity, venous insufficiency and varicose veins of the lower extremity, PSVT with a heart rate of 193 beats minute when seen in the emergency room on 08/26/2018 and spontaneously converted to sinus rhythm after IV diltiazem.    The patient presents for annual visit of PSVT.  She has occasional episodes of palpitations with relief with  Valsalva maneuver.  No change in physical exam, blood pressure is well controlled.  Otherwise states that she is doing well and remains asymptomatic.   Her asthma is under good control and she will use BB only on a prn basis.  I will see her back on a as needed basis.   Adrian Prows, MD, [facility] 11/24/2021, 2:02 PM Office: [phone]

## 2022-02-05 ENCOUNTER — Ambulatory Visit (INDEPENDENT_AMBULATORY_CARE_PROVIDER_SITE_OTHER): Payer: PPO | Admitting: Emergency Medicine

## 2022-02-05 ENCOUNTER — Encounter: Payer: Self-pay | Admitting: Emergency Medicine

## 2022-02-05 VITALS — BP 130/80 | HR 70 | Temp 98.1°F | Ht 59.0 in | Wt 199.0 lb

## 2022-02-05 DIAGNOSIS — E785 Hyperlipidemia, unspecified: Secondary | ICD-10-CM

## 2022-02-05 DIAGNOSIS — R7303 Prediabetes: Secondary | ICD-10-CM | POA: Diagnosis not present

## 2022-02-05 DIAGNOSIS — I471 Supraventricular tachycardia, unspecified: Secondary | ICD-10-CM

## 2022-02-05 DIAGNOSIS — E559 Vitamin D deficiency, unspecified: Secondary | ICD-10-CM | POA: Diagnosis not present

## 2022-02-05 DIAGNOSIS — I10 Essential (primary) hypertension: Secondary | ICD-10-CM

## 2022-02-05 DIAGNOSIS — Z23 Encounter for immunization: Secondary | ICD-10-CM | POA: Diagnosis not present

## 2022-02-05 LAB — COMPREHENSIVE METABOLIC PANEL
ALT: 29 U/L (ref 0–35)
AST: 22 U/L (ref 0–37)
Albumin: 4 g/dL (ref 3.5–5.2)
Alkaline Phosphatase: 73 U/L (ref 39–117)
BUN: 17 mg/dL (ref 6–23)
CO2: 33 mEq/L — ABNORMAL HIGH (ref 19–32)
Calcium: 9.3 mg/dL (ref 8.4–10.5)
Chloride: 104 mEq/L (ref 96–112)
Creatinine, Ser: 0.7 mg/dL (ref 0.40–1.20)
GFR: 81.33 mL/min (ref >60.00)
Glucose, Bld: 97 mg/dL (ref 70–99)
Potassium: 3.9 mEq/L (ref 3.5–5.1)
Sodium: 142 mEq/L (ref 135–145)
Total Bilirubin: 0.6 mg/dL (ref 0.2–1.2)
Total Protein: 7.1 g/dL (ref 6.0–8.3)

## 2022-02-05 LAB — LIPID PANEL
Cholesterol: 179 mg/dL (ref 0–200)
HDL: 58.2 mg/dL (ref >39.00)
LDL Cholesterol: 107 mg/dL — ABNORMAL HIGH (ref 0–99)
NonHDL: 121.27
Total CHOL/HDL Ratio: 3
Triglycerides: 69 mg/dL (ref 0.0–149.0)
VLDL: 13.8 mg/dL (ref 0.0–40.0)

## 2022-02-05 LAB — CBC WITH DIFFERENTIAL/PLATELET
Basophils Absolute: 0 10*3/uL (ref 0.0–0.1)
Basophils Relative: 0.4 % (ref 0.0–3.0)
Eosinophils Absolute: 0.1 10*3/uL (ref 0.0–0.7)
Eosinophils Relative: 1.6 % (ref 0.0–5.0)
HCT: 40 % (ref 36.0–46.0)
Hemoglobin: 13.1 g/dL (ref 12.0–15.0)
Lymphocytes Relative: 24.2 % (ref 12.0–46.0)
Lymphs Abs: 1.6 10*3/uL (ref 0.7–4.0)
MCHC: 32.7 g/dL (ref 30.0–36.0)
MCV: 91.8 fl (ref 78.0–100.0)
Monocytes Absolute: 0.4 10*3/uL (ref 0.1–1.0)
Monocytes Relative: 6.8 % (ref 3.0–12.0)
Neutro Abs: 4.4 10*3/uL (ref 1.4–7.7)
Neutrophils Relative %: 67 % (ref 43.0–77.0)
Platelets: 210 10*3/uL (ref 150.0–400.0)
RBC: 4.36 Mil/uL (ref 3.87–5.11)
RDW: 14.7 % (ref 11.5–15.5)
WBC: 6.6 10*3/uL (ref 4.0–10.5)

## 2022-02-05 LAB — VITAMIN D 25 HYDROXY (VIT D DEFICIENCY, FRACTURES): VITD: 54.97 ng/mL (ref 30.00–100.00)

## 2022-02-05 LAB — HEMOGLOBIN A1C: Hgb A1c MFr Bld: 5.9 % (ref 4.6–6.5)

## 2022-02-05 MED ORDER — VITAMIN D 50 MCG (2000 UT) PO CAPS: 1.0000 | ORAL_CAPSULE | Freq: Every day | ORAL | 3 refills | Status: AC

## 2022-02-05 MED ORDER — DILTIAZEM HCL ER COATED BEADS 240 MG PO CP24: 240.0000 mg | ORAL_CAPSULE | Freq: Every day | ORAL | 3 refills | Status: DC

## 2022-02-05 NOTE — Assessment & Plan Note (Signed)
Stable.  Diet and nutrition discussed.  Not taking cholesterol medication at present time.  Lipid profile done today.

## 2022-02-05 NOTE — Assessment & Plan Note (Signed)
Diet and nutrition discussed.  Hemoglobin A1c done today. 

## 2022-02-05 NOTE — Patient Instructions (Signed)
Hypertension, Adult High blood pressure (hypertension) is when the force of blood pumping through the arteries is too strong. The arteries are the blood vessels that carry blood from the heart throughout the body. Hypertension forces the heart to work harder to pump blood and may cause arteries to become narrow or stiff. Untreated or uncontrolled hypertension can lead to a heart attack, heart failure, a stroke, kidney disease, and other problems. A blood pressure reading consists of a higher number over a lower number. Ideally, your blood pressure should be below 120/80. The first ("top") number is called the systolic pressure. It is a measure of the pressure in your arteries as your heart beats. The second ("bottom") number is called the diastolic pressure. It is a measure of the pressure in your arteries as the heart relaxes. What are the causes? The exact cause of this condition is not known. There are some conditions that result in high blood pressure. What increases the risk? Certain factors may make you more likely to develop high blood pressure. Some of these risk factors are under your control, including: Smoking. Not getting enough exercise or physical activity. Being overweight. Having too much fat, sugar, calories, or salt (sodium) in your diet. Drinking too much alcohol. Other risk factors include: Having a personal history of heart disease, diabetes, high cholesterol, or kidney disease. Stress. Having a family history of high blood pressure and high cholesterol. Having obstructive sleep apnea. Age. The risk increases with age. What are the signs or symptoms? High blood pressure may not cause symptoms. Very high blood pressure (hypertensive crisis) may cause: Headache. Fast or irregular heartbeats (palpitations). Shortness of breath. Nosebleed. Nausea and vomiting. Vision changes. Severe chest pain, dizziness, and seizures. How is this diagnosed? This condition is diagnosed by  measuring your blood pressure while you are seated, with your arm resting on a flat surface, your legs uncrossed, and your feet flat on the floor. The cuff of the blood pressure monitor will be placed directly against the skin of your upper arm at the level of your heart. Blood pressure should be measured at least twice using the same arm. Certain conditions can cause a difference in blood pressure between your right and left arms. If you have a high blood pressure reading during one visit or you have normal blood pressure with other risk factors, you may be asked to: Return on a different day to have your blood pressure checked again. Monitor your blood pressure at home for 1 week or longer. If you are diagnosed with hypertension, you may have other blood or imaging tests to help your health care provider understand your overall risk for other conditions. How is this treated? This condition is treated by making healthy lifestyle changes, such as eating healthy foods, exercising more, and reducing your alcohol intake. You may be referred for counseling on a healthy diet and physical activity. Your health care provider may prescribe medicine if lifestyle changes are not enough to get your blood pressure under control and if: Your systolic blood pressure is above 130. Your diastolic blood pressure is above 80. Your personal target blood pressure may vary depending on your medical conditions, your age, and other factors. Follow these instructions at home: Eating and drinking  Eat a diet that is high in fiber and potassium, and low in sodium, added sugar, and fat. An example of this eating plan is called the DASH diet. DASH stands for Dietary Approaches to Stop Hypertension. To eat this way: Eat   plenty of fresh fruits and vegetables. Try to fill one half of your plate at each meal with fruits and vegetables. Eat whole grains, such as whole-wheat pasta, Bolda rice, or whole-grain bread. Fill about one  fourth of your plate with whole grains. Eat or drink low-fat dairy products, such as skim milk or low-fat yogurt. Avoid fatty cuts of meat, processed or cured meats, and poultry with skin. Fill about one fourth of your plate with lean proteins, such as fish, chicken without skin, beans, eggs, or tofu. Avoid pre-made and processed foods. These tend to be higher in sodium, added sugar, and fat. Reduce your daily sodium intake. Many people with hypertension should eat less than 1,500 mg of sodium a day. Do not drink alcohol if: Your health care provider tells you not to drink. You are pregnant, may be pregnant, or are planning to become pregnant. If you drink alcohol: Limit how much you have to: 0-1 drink a day for women. 0-2 drinks a day for men. Know how much alcohol is in your drink. In the [place] one drink equals one 12 oz bottle of beer (355 mL), one 5 oz glass of wine (148 mL), or one 1 oz glass of hard liquor (44 mL). Lifestyle  Work with your health care provider to maintain a healthy body weight or to lose weight. Ask what an ideal weight is for you. Get at least 30 minutes of exercise that causes your heart to beat faster (aerobic exercise) most days of the week. Activities may include walking, swimming, or biking. Include exercise to strengthen your muscles (resistance exercise), such as Pilates or lifting weights, as part of your weekly exercise routine. Try to do these types of exercises for 30 minutes at least 3 days a week. Do not use any products that contain nicotine or tobacco. These products include cigarettes, chewing tobacco, and vaping devices, such as e-cigarettes. If you need help quitting, ask your health care provider. Monitor your blood pressure at home as told by your health care provider. Keep all follow-up visits. This is important. Medicines Take over-the-counter and prescription medicines only as told by your health care provider. Follow directions carefully. Blood  pressure medicines must be taken as prescribed. Do not skip doses of blood pressure medicine. Doing this puts you at risk for problems and can make the medicine less effective. Ask your health care provider about side effects or reactions to medicines that you should watch for. Contact a health care provider if you: Think you are having a reaction to a medicine you are taking. Have headaches that keep coming back (recurring). Feel dizzy. Have swelling in your ankles. Have trouble with your vision. Get help right away if you: Develop a severe headache or confusion. Have unusual weakness or numbness. Feel faint. Have severe pain in your chest or abdomen. Vomit repeatedly. Have trouble breathing. These symptoms may be an emergency. Get help right away. Call 911. Do not wait to see if the symptoms will go away. Do not drive yourself to the hospital. Summary Hypertension is when the force of blood pumping through your arteries is too strong. If this condition is not controlled, it may put you at risk for serious complications. Your personal target blood pressure may vary depending on your medical conditions, your age, and other factors. For most people, a normal blood pressure is less than 120/80. Hypertension is treated with lifestyle changes, medicines, or a combination of both. Lifestyle changes include losing weight, eating a healthy,   low-sodium diet, exercising more, and limiting alcohol. This information is not intended to replace advice given to you by your health care provider. Make sure you discuss any questions you have with your health care provider. Document Revised: 12/17/2020 Document Reviewed: 12/17/2020 Elsevier Patient Education  2023 [employer]  

## 2022-02-05 NOTE — Assessment & Plan Note (Signed)
Stable.  No recent episodes. Advised by cardiologist to take propranolol 40 mg as needed for these events.

## 2022-02-05 NOTE — Progress Notes (Signed)
Gina Guerrero Guerrero 81 y.o.   Chief Complaint  Patient presents with   Follow-up    HISTORY OF PRESENT ILLNESS: This is Gina Guerrero 81 y.o. female here for 74-monthfollow-up of hypertension. Presently on Cardizem 240 mg daily and daily baby aspirin Has history of vitamin D deficiency. No complaints or medical concerns today. Most recent cardiologist visit last October as follows: Assessment        ICD-10-CM    1. PSVT (paroxysmal supraventricular tachycardia)  I47.10 EKG 12-Lead     2. Primary hypertension  I10           No orders of the defined types were placed in this encounter.         Medications Discontinued During This Encounter  Medication Reason   atorvastatin (LIPITOR) 10 MG tablet      Recommendations:    Gina Guerrero Guerrero Gina Guerrero 81y.o. AAF patient with  medical history is significant for asthma, Hyperlipidemia, morbid obesity, venous insufficiency and varicose veins of the lower extremity, PSVT with Gina Guerrero heart rate of 193 beats minute when seen in the emergency room on 08/26/2018 and spontaneously converted to sinus rhythm after IV diltiazem.     The patient presents for annual visit of PSVT.  She has occasional episodes of palpitations with relief with Valsalva maneuver.  No change in physical exam, blood pressure is well controlled.  Otherwise states that she is doing well and remains asymptomatic.   Her asthma is under good control and she will use BB only on Gina Guerrero prn basis.  I will see her back on Gina Guerrero as needed basis.    Gina Guerrero Prows MD, [facility]10/03/2021, 2:02 PM Office: [phone]   HPI   Prior to Admission medications   Medication Sig Start Date End Date Taking? Authorizing Provider  albuterol (VENTOLIN HFA) 108 (90 Base) MCG/ACT inhaler Inhale 2 puffs into the lungs every 4 (four) hours as needed for wheezing or shortness of breath (cough, shortness of breath or wheezing.). 08/26/18  Yes Law, Alexandra M, PA-C  aspirin EC 81 MG tablet Take 81 mg by mouth daily.   Yes  [provider]  azelastine (ASTELIN) 0.1 % nasal spray Place 2 sprays into both nostrils 2 (two) times daily as needed for rhinitis. 03/02/19  Yes Gina Guerrero Shaggy MD  Cholecalciferol (VITAMIN D) 50 MCG (2000 UT) CAPS Take by mouth.   Yes [provider]  cyclobenzaprine (FLEXERIL) 5 MG tablet Take 5 mg by mouth 3 (three) times daily as needed for muscle spasms.   Yes [provider]  Multiple Vitamins-Minerals (PRESERVISION AREDS 2 PO) Take 1 tablet by mouth 2 (two) times daily.   Yes [provider]  propranolol (INDERAL) 40 MG tablet Take 1 tablet (40 mg total) by mouth 3 (three) times daily as needed (Heart racing). 11/22/20  Yes GAdrian Prows MD  triamcinolone cream (KENALOG) 0.1 % Apply 1 application topically 2 (two) times daily as needed. 06/15/19  Yes Gina Guerrero Guerrero ILilia Argue MD  triamcinolone cream (KENALOG) 0.1 % Apply 1 application  topically 2 (two) times daily. 08/06/21  Yes Gina Guerrero Guerrero, Gina Guerrero Bloomer MD  diltiazem (CARDIZEM CD) 240 MG 24 hr capsule Take 1 capsule (240 mg total) by mouth daily. Patient not taking: Reported on 11/24/2021 01/23/20 01/17/21  Just, KLaurita Quint FNP    No Known Allergies  Patient Active Problem List   Diagnosis Date Noted   Essential hypertension 08/06/2021   Dyslipidemia 08/06/2021   Allergic dermatitis 08/06/2021  Prediabetes 04/23/2020   Mixed hyperlipidemia 04/23/2020   Osteopenia determined by x-ray 11/2018   SVT (supraventricular tachycardia) 09/14/2018   Perennial allergic rhinitis 08/30/2018   Chronic cough 05/13/2016    Past Medical History:  Diagnosis Date   Asthma    Cataract    Hyperlipidemia    Hypertension    Osteopenia determined by x-ray 11/2018   low FRAX score   Tachycardia     Past Surgical History:  Procedure Laterality Date   ABDOMINAL HYSTERECTOMY     EYE SURGERY Bilateral 2013   TOE SURGERY      Social History   Socioeconomic History   Marital status: Married    Spouse name:  Not on file   Number of children: 0   Years of education: Not on file   Highest education level: Not on file  Occupational History   Not on file  Tobacco Use   Smoking status: Never   Smokeless tobacco: Never  Vaping Use   Vaping Use: Never used  Substance and Sexual Activity   Alcohol use: No   Drug use: Never   Sexual activity: Not on file  Other Topics Concern   Not on file  Social History Narrative   Lives with husband and is currently taking care of her niece who is dying of brain cancer.     No children.   Social Determinants of Health   Financial Resource Strain: Not on file  Food Insecurity: Not on file  Transportation Needs: Not on file  Physical Activity: Not on file  Stress: Not on file  Social Connections: Not on file  Intimate Partner Violence: Not on file    Family History  Problem Relation Age of Onset   Alopecia Father    Heart disease Sister    COPD Brother    Allergic rhinitis Neg Hx    Angioedema Neg Hx    Asthma Neg Hx    Eczema Neg Hx    Immunodeficiency Neg Hx    Urticaria Neg Hx      Review of Systems  Constitutional: Negative.  Negative for chills and fever.  HENT: Negative.  Negative for congestion and sore throat.   Respiratory: Negative.  Negative for cough and shortness of breath.   Cardiovascular: Negative.  Negative for chest pain and palpitations.  Gastrointestinal:  Negative for abdominal pain, nausea and vomiting.  Genitourinary: Negative.   Skin: Negative.  Negative for rash.  Neurological: Negative.  Negative for dizziness and headaches.  All other systems reviewed and are negative.  Today's Vitals   02/05/22 1302 02/05/22 1304  BP: (!) 142/82 130/80  Pulse: 70   Temp: 98.1 F (36.7 C)   TempSrc: Oral   SpO2: 99%   Weight: 199 lb (90.3 kg)   Height: '4\' 11"'$  (1.499 Guerrero)    Body mass index is 40.19 kg/Guerrero.   Physical Exam Vitals reviewed.  Constitutional:      Appearance: Normal appearance.  HENT:     Head:  Normocephalic.     Mouth/Throat:     Mouth: Mucous membranes are moist.     Pharynx: Oropharynx is clear.  Eyes:     Extraocular Movements: Extraocular movements intact.     Conjunctiva/sclera: Conjunctivae normal.     Pupils: Pupils are equal, round, and reactive to light.  Cardiovascular:     Rate and Rhythm: Normal rate and regular rhythm.     Pulses: Normal pulses.     Heart sounds: Normal heart sounds.  Pulmonary:     Effort: Pulmonary effort is normal.     Breath sounds: Normal breath sounds.  Musculoskeletal:     Cervical back: No tenderness.  Lymphadenopathy:     Cervical: No cervical adenopathy.  Skin:    General: Skin is warm and dry.  Neurological:     Mental Status: She is alert and oriented to person, place, and time.  Psychiatric:        Mood and Affect: Mood normal.        Behavior: Behavior normal.      ASSESSMENT & PLAN: Gina Guerrero total of 45 minutes was spent with the patient and counseling/coordination of care regarding preparing for this visit, review of most recent office visit notes, review of most recent cardiologist office visit notes, review of multiple chronic medical conditions and their management, review of all medications, review of most recent blood work results, education on nutrition, cardiovascular risks associated with hypertension, prognosis, need for blood work today, documentation and need for follow-up.  Problem List Items Addressed This Visit       Cardiovascular and Mediastinum   SVT (supraventricular tachycardia)    Stable.  No recent episodes. Advised by cardiologist to take propranolol 40 mg as needed for these events.      Relevant Medications   diltiazem (CARDIZEM CD) 240 MG 24 hr capsule   Essential hypertension - Primary    Well-controlled hypertension with normal blood pressure readings at home. Continue diltiazem 240 mg daily. Cardiovascular risk associated with hypertension discussed. Diet approaches to stop hypertension  discussed.      Relevant Medications   diltiazem (CARDIZEM CD) 240 MG 24 hr capsule   Other Relevant Orders   CBC with Differential/Platelet   Comprehensive metabolic panel     Other   Prediabetes    Diet and nutrition discussed.  Hemoglobin A1c done today.      Dyslipidemia    Stable.  Diet and nutrition discussed.  Not taking cholesterol medication at present time.  Lipid profile done today.      Relevant Orders   Comprehensive metabolic panel   Hemoglobin A1c   Lipid panel   Other Visit Diagnoses     Vitamin D deficiency       Relevant Medications   Cholecalciferol (VITAMIN D) 50 MCG (2000 UT) CAPS   Other Relevant Orders   VITAMIN D 25 Hydroxy (Vit-D Deficiency, Fractures)   Primary hypertension       Relevant Medications   diltiazem (CARDIZEM CD) 240 MG 24 hr capsule   PSVT (paroxysmal supraventricular tachycardia)       Relevant Medications   diltiazem (CARDIZEM CD) 240 MG 24 hr capsule      Patient Instructions  Hypertension, Adult High blood pressure (hypertension) is when the force of blood pumping through the arteries is too strong. The arteries are the blood vessels that carry blood from the heart throughout the body. Hypertension forces the heart to work harder to pump blood and may cause arteries to become narrow or stiff. Untreated or uncontrolled hypertension can lead to Gina Guerrero heart attack, heart failure, Gina Guerrero stroke, kidney disease, and other problems. Gina Guerrero blood pressure reading consists of Gina Guerrero higher number over Gina Guerrero lower number. Ideally, your blood pressure should be below 120/80. The first ("top") number is called the systolic pressure. It is Gina Guerrero measure of the pressure in your arteries as your heart beats. The second ("bottom") number is called the diastolic pressure. It is Gina Guerrero measure of the pressure in your  arteries as the heart relaxes. What are the causes? The exact cause of this condition is not known. There are some conditions that result in high blood  pressure. What increases the risk? Certain factors may make you more likely to develop high blood pressure. Some of these risk factors are under your control, including: Smoking. Not getting enough exercise or physical activity. Being overweight. Having too much fat, sugar, calories, or salt (sodium) in your diet. Drinking too much alcohol. Other risk factors include: Having Gina Guerrero personal history of heart disease, diabetes, high cholesterol, or kidney disease. Stress. Having Gina Guerrero family history of high blood pressure and high cholesterol. Having obstructive sleep apnea. Age. The risk increases with age. What are the signs or symptoms? High blood pressure may not cause symptoms. Very high blood pressure (hypertensive crisis) may cause: Headache. Fast or irregular heartbeats (palpitations). Shortness of breath. Nosebleed. Nausea and vomiting. Vision changes. Severe chest pain, dizziness, and seizures. How is this diagnosed? This condition is diagnosed by measuring your blood pressure while you are seated, with your arm resting on Gina Guerrero flat surface, your legs uncrossed, and your feet flat on the floor. The cuff of the blood pressure monitor will be placed directly against the skin of your upper arm at the level of your heart. Blood pressure should be measured at least twice using the same arm. Certain conditions can cause Gina Guerrero difference in blood pressure between your right and left arms. If you have Gina Guerrero high blood pressure reading during one visit or you have normal blood pressure with other risk factors, you may be asked to: Return on Gina Guerrero different day to have your blood pressure checked again. Monitor your blood pressure at home for 1 week or longer. If you are diagnosed with hypertension, you may have other blood or imaging tests to help your health care provider understand your overall risk for other conditions. How is this treated? This condition is treated by making healthy lifestyle changes, such  as eating healthy foods, exercising more, and reducing your alcohol intake. You may be referred for counseling on Gina Guerrero healthy diet and physical activity. Your health care provider may prescribe medicine if lifestyle changes are not enough to get your blood pressure under control and if: Your systolic blood pressure is above 130. Your diastolic blood pressure is above 80. Your personal target blood pressure may vary depending on your medical conditions, your age, and other factors. Follow these instructions at home: Eating and drinking  Eat Gina Guerrero diet that is high in fiber and potassium, and low in sodium, added sugar, and fat. An example of this eating plan is called the DASH diet. DASH stands for Dietary Approaches to Stop Hypertension. To eat this way: Eat plenty of fresh fruits and vegetables. Try to fill one half of your plate at each meal with fruits and vegetables. Eat whole grains, such as whole-wheat pasta, [place] rice, or whole-grain bread. Fill about one fourth of your plate with whole grains. Eat or drink low-fat dairy products, such as skim milk or low-fat yogurt. Avoid fatty cuts of meat, processed or cured meats, and poultry with skin. Fill about one fourth of your plate with lean proteins, such as fish, chicken without skin, beans, eggs, or tofu. Avoid pre-made and processed foods. These tend to be higher in sodium, added sugar, and fat. Reduce your daily sodium intake. Many people with hypertension should eat less than 1,500 mg of sodium Gina Guerrero day. Do not drink alcohol if: Your health care provider  tells you not to drink. You are pregnant, may be pregnant, or are planning to become pregnant. If you drink alcohol: Limit how much you have to: 0-1 drink Gina Guerrero day for women. 0-2 drinks Gina Guerrero day for men. Know how much alcohol is in your drink. In the [place] one drink equals one 12 oz bottle of beer (355 mL), one 5 oz glass of wine (148 mL), or one 1 oz glass of hard liquor (44 mL). Lifestyle  Work  with your health care provider to maintain Gina Guerrero healthy body weight or to lose weight. Ask what an ideal weight is for you. Get at least 30 minutes of exercise that causes your heart to beat faster (aerobic exercise) most days of the week. Activities may include walking, swimming, or biking. Include exercise to strengthen your muscles (resistance exercise), such as Pilates or lifting weights, as part of your weekly exercise routine. Try to do these types of exercises for 30 minutes at least 3 days Gina Guerrero week. Do not use any products that contain nicotine or tobacco. These products include cigarettes, chewing tobacco, and vaping devices, such as e-cigarettes. If you need help quitting, ask your health care provider. Monitor your blood pressure at home as told by your health care provider. Keep all follow-up visits. This is important. Medicines Take over-the-counter and prescription medicines only as told by your health care provider. Follow directions carefully. Blood pressure medicines must be taken as prescribed. Do not skip doses of blood pressure medicine. Doing this puts you at risk for problems and can make the medicine less effective. Ask your health care provider about side effects or reactions to medicines that you should watch for. Contact Gina Guerrero health care provider if you: Think you are having Gina Guerrero reaction to Gina Guerrero medicine you are taking. Have headaches that keep coming back (recurring). Feel dizzy. Have swelling in your ankles. Have trouble with your vision. Get help right away if you: Develop Gina Guerrero severe headache or confusion. Have unusual weakness or numbness. Feel faint. Have severe pain in your chest or abdomen. Vomit repeatedly. Have trouble breathing. These symptoms may be an emergency. Get help right away. Call 911. Do not wait to see if the symptoms will go away. Do not drive yourself to the hospital. Summary Hypertension is when the force of blood pumping through your arteries is too  strong. If this condition is not controlled, it may put you at risk for serious complications. Your personal target blood pressure may vary depending on your medical conditions, your age, and other factors. For most people, Gina Guerrero normal blood pressure is less than 120/80. Hypertension is treated with lifestyle changes, medicines, or Gina Guerrero combination of both. Lifestyle changes include losing weight, eating Gina Guerrero healthy, low-sodium diet, exercising more, and limiting alcohol. This information is not intended to replace advice given to you by your health care provider. Make sure you discuss any questions you have with your health care provider. Document Revised: 12/17/2020 Document Reviewed: 12/17/2020 Elsevier Patient Education  [place], MD [facility]

## 2022-02-05 NOTE — Assessment & Plan Note (Signed)
Well-controlled hypertension with normal blood pressure readings at home. Continue diltiazem 240 mg daily. Cardiovascular risk associated with hypertension discussed. Diet approaches to stop hypertension discussed.

## 2022-05-15 ENCOUNTER — Other Ambulatory Visit: Payer: Self-pay | Admitting: Cardiology

## 2022-05-15 DIAGNOSIS — I471 Supraventricular tachycardia, unspecified: Secondary | ICD-10-CM

## 2022-06-30 ENCOUNTER — Telehealth: Payer: Self-pay | Admitting: Emergency Medicine

## 2022-06-30 NOTE — Telephone Encounter (Signed)
Contacted Delsa Bern to schedule their annual wellness visit. Appointment made for 07/14/2022.  [facility] Care Guide [facility] AWV TEAM Direct Dial: [phone]

## 2022-07-13 ENCOUNTER — Telehealth: Payer: Self-pay | Admitting: Emergency Medicine

## 2022-07-13 NOTE — Telephone Encounter (Signed)
Patient has initial AWV scheduled for 07/14/22. She needs to reschedule due to an appointment for her husband. Since it is an initial appointment, she would like a call back from [place] to reschedule. Best callback is [phone].

## 2022-07-14 ENCOUNTER — Ambulatory Visit (INDEPENDENT_AMBULATORY_CARE_PROVIDER_SITE_OTHER): Payer: PPO

## 2022-07-14 VITALS — Ht 59.0 in | Wt 180.0 lb

## 2022-07-14 DIAGNOSIS — Z0000 Encounter for general adult medical examination without abnormal findings: Secondary | ICD-10-CM

## 2022-07-14 DIAGNOSIS — Z13820 Encounter for screening for osteoporosis: Secondary | ICD-10-CM | POA: Diagnosis not present

## 2022-07-14 NOTE — Progress Notes (Signed)
I connected with  Delsa Bern on 07/14/22 by a audio enabled telemedicine application and verified that I am speaking with the correct person using two identifiers.  Patient Location: Home  Provider Location: Office/Clinic  I discussed the limitations of evaluation and management by telemedicine. The patient expressed understanding and agreed to proceed.  Subjective:   Gina Guerrero is a 82 y.o. female who presents for Medicare Annual (Subsequent) preventive examination.  Review of Systems     Cardiac Risk Factors include: advanced age (>74men, >60 women);dyslipidemia;hypertension;family history of premature cardiovascular disease     Objective:    Today's Vitals   07/14/22 1533  Weight: 180 lb (81.6 kg)  Height: 4\' 11"  (1.499 m)  PainSc: 0-No pain   Body mass index is 36.36 kg/m.     07/14/2022    3:36 PM  Advanced Directives  Does Patient Have a Medical Advance Directive? Yes  Type of Estate agent of [place];Living will  Copy of Healthcare Power of Attorney in Chart? No - copy requested    Current Medications (verified) Outpatient Encounter Medications as of 07/14/2022  Medication Sig   [DISCONTINUED] triamcinolone cream (KENALOG) 0.1 % Apply 1 application topically 2 (two) times daily as needed.   albuterol (VENTOLIN HFA) 108 (90 Base) MCG/ACT inhaler Inhale 2 puffs into the lungs every 4 (four) hours as needed for wheezing or shortness of breath (cough, shortness of breath or wheezing.).   aspirin EC 81 MG tablet Take 81 mg by mouth daily.   azelastine (ASTELIN) 0.1 % nasal spray Place 2 sprays into both nostrils 2 (two) times daily as needed for rhinitis.   Cholecalciferol (VITAMIN D) 50 MCG (2000 UT) CAPS Take 1 capsule (2,000 Units total) by mouth daily.   cyclobenzaprine (FLEXERIL) 5 MG tablet Take 5 mg by mouth 3 (three) times daily as needed for muscle spasms.   diltiazem (CARDIZEM CD) 240 MG 24 hr capsule Take 1 capsule (240 mg  total) by mouth daily.   Multiple Vitamins-Minerals (PRESERVISION AREDS 2 PO) Take 1 tablet by mouth 2 (two) times daily.   propranolol (INDERAL) 40 MG tablet TAKE 1 TABLET(40MG ) BY MOUTH THREE TIMES DAILY AS NEEDED(HEART RACING)   triamcinolone cream (KENALOG) 0.1 % Apply 1 application  topically 2 (two) times daily.   No facility-administered encounter medications on file as of 07/14/2022.    Allergies (verified) Patient has no known allergies.   History: Past Medical History:  Diagnosis Date   Asthma    Cataract    Hyperlipidemia    Hypertension    Osteopenia determined by x-ray 11/2018   low FRAX score   Tachycardia    Past Surgical History:  Procedure Laterality Date   ABDOMINAL HYSTERECTOMY     EYE SURGERY Bilateral 2013   TOE SURGERY     Family History  Problem Relation Age of Onset   Alopecia Father    Heart disease Sister    COPD Brother    Allergic rhinitis Neg Hx    Angioedema Neg Hx    Asthma Neg Hx    Eczema Neg Hx    Immunodeficiency Neg Hx    Urticaria Neg Hx    Social History   Socioeconomic History   Marital status: Married    Spouse name: Not on file   Number of children: 0   Years of education: Not on file   Highest education level: Not on file  Occupational History   Not on file  Tobacco Use  Smoking status: Never   Smokeless tobacco: Never  Vaping Use   Vaping Use: Never used  Substance and Sexual Activity   Alcohol use: No   Drug use: Never   Sexual activity: Not on file  Other Topics Concern   Not on file  Social History Narrative   Lives with husband and is currently taking care of her niece who is dying of brain cancer.     No children.   Social Determinants of Health   Financial Resource Strain: Low Risk  (07/14/2022)   Overall Financial Resource Strain (CARDIA)    Difficulty of Paying Living Expenses: Not hard at all  Food Insecurity: No Food Insecurity (07/14/2022)   Hunger Vital Sign    Worried About Running Out of  Food in the Last Year: Never true    Ran Out of Food in the Last Year: Never true  Transportation Needs: No Transportation Needs (07/14/2022)   PRAPARE - Administrator, Civil Service (Medical): No    Lack of Transportation (Non-Medical): No  Physical Activity: Sufficiently Active (07/14/2022)   Exercise Vital Sign    Days of Exercise per Week: 5 days    Minutes of Exercise per Session: 30 min  Stress: No Stress Concern Present (07/14/2022)   [employer] of Occupational Health - Occupational Stress Questionnaire    Feeling of Stress : Not at all  Social Connections: Socially Integrated (07/14/2022)   Social Connection and Isolation Panel [NHANES]    Frequency of Communication with Friends and Family: More than three times a week    Frequency of Social Gatherings with Friends and Family: More than three times a week    Attends Religious Services: More than 4 times per year    Active Member of [employer] or Organizations: Yes    Attends Engineer, structural: More than 4 times per year    Marital Status: Married    Tobacco Counseling Counseling given: Not Answered   Clinical Intake:  Pre-visit preparation completed: Yes  Pain : No/denies pain Pain Score: 0-No pain     BMI - recorded: 36.36 Nutritional Status: BMI > 30  Obese Nutritional Risks: None Diabetes: No  How often do you need to have someone help you when you read instructions, pamphlets, or other written materials from your doctor or pharmacy?: 1 - Never What is the last grade level you completed in school?: HSG  Diabetic? No  Interpreter Needed?: No  Information entered by :: Zhaniya Swallows N. Issam Carlyon, LPN.   Activities of Daily Living    07/14/2022    3:45 PM 07/14/2022    3:40 PM  In your present state of health, do you have any difficulty performing the following activities:  Hearing? 0 0  Vision? 0 0  Difficulty concentrating or making decisions? 0 0  Walking or climbing stairs? 0 0   Dressing or bathing? 0 0  Doing errands, shopping? 0 0  Preparing Food and eating ? N N  Using the Toilet? N N  In the past six months, have you accidently leaked urine? N N  Do you have problems with loss of bowel control? N N  Managing your Medications? N N  Managing your Finances? N N  Housekeeping or managing your Housekeeping? N N    Patient Care Team: Georgina Quint, MD as PCP - General (Internal Medicine) Yates Decamp, MD as Consulting Physician (Cardiology) Shon Millet, MD as Consulting Physician (Ophthalmology)  Indicate any recent Medical Services you  may have received from other than [facility] providers in the past year (date may be approximate).     Assessment:   This is a routine wellness examination for [facility].  Hearing/Vision screen Hearing Screening - Comments:: Denies hearing difficulties   Vision Screening - Comments:: Wears rx glasses - up to date with routine eye exams with Shari Prows, MD.   Dietary issues and exercise activities discussed: Current Exercise Habits: Home exercise routine, Type of exercise: walking, Time (Minutes): 30, Frequency (Times/Week): 5, Weekly Exercise (Minutes/Week): 150, Intensity: Moderate, Exercise limited by: None identified   Goals Addressed             This Visit's Progress    My goal for 2024 is to lose weight.  My weight goal is to be around 145-150 pounds.        Depression Screen    07/14/2022    3:39 PM 02/05/2022    1:02 PM 08/06/2021    1:03 PM 04/23/2020   11:41 AM 01/23/2020   11:10 AM 06/15/2019   10:53 AM 09/13/2018    9:54 AM  PHQ 2/9 Scores  PHQ - 2 Score 0 0 0 0 0 0 0  PHQ- 9 Score 0 0         Fall Risk    07/14/2022    3:37 PM 02/05/2022    1:02 PM 08/06/2021    1:03 PM 04/23/2020   11:41 AM 01/23/2020   11:10 AM  Fall Risk   Falls in the past year? 0 0 0 0 0  Number falls in past yr: 0 0  0 0  Injury with Fall? 0 0  0 0  Risk for fall due to : No Fall Risks No Fall Risks     Follow up  Falls prevention discussed Falls evaluation completed  Falls evaluation completed Falls evaluation completed    FALL RISK PREVENTION PERTAINING TO THE HOME:  Any stairs in or around the home? Yes live on main level of home If so, are there any without handrails? No  Home free of loose throw rugs in walkways, pet beds, electrical cords, etc? Yes  Adequate lighting in your home to reduce risk of falls? Yes   ASSISTIVE DEVICES UTILIZED TO PREVENT FALLS:  Life alert? No  Use of a cane, walker or w/c? No  Grab bars in the bathroom? Yes  Shower chair or bench in shower? Yes  Elevated toilet seat or a handicapped toilet? Yes   TIMED UP AND GO:  Was the test performed? No . Telephonic Visit  Cognitive Function:        07/14/2022    3:40 PM  6CIT Screen  What Year? 0 points  What month? 0 points  What time? 0 points  Count back from 20 0 points  Months in reverse 0 points  Repeat phrase 0 points  Total Score 0 points    Immunizations Immunization History  Administered Date(s) Administered   Fluad Quad(high Dose 65+) 12/15/2018, 02/05/2022   Influenza,inj,Quad PF,6+ Mos 03/14/2015, 03/26/2016   Influenza-Unspecified 12/27/2019   PFIZER(Purple Top)SARS-COV-2 Vaccination 03/15/2019, 04/05/2019, 11/28/2019, 06/20/2020, 11/25/2020   Pneumococcal Conjugate-13 09/13/2018   Pneumococcal Polysaccharide-23 01/23/2020   Respiratory Syncytial Virus Vaccine,Recomb Aduvanted(Arexvy) 11/25/2021    TDAP status: Due, Education has been provided regarding the importance of this vaccine. Advised may receive this vaccine at local pharmacy or Health Dept. Aware to provide a copy of the vaccination record if obtained from local pharmacy or Health Dept. Verbalized acceptance and  understanding.  Flu Vaccine status: Up to date  Pneumococcal vaccine status: Up to date  Covid-19 vaccine status: Completed vaccines  Qualifies for Shingles Vaccine? Yes   Zostavax completed No   Shingrix  Completed?: No.    Education has been provided regarding the importance of this vaccine. Patient has been advised to call insurance company to determine out of pocket expense if they have not yet received this vaccine. Advised may also receive vaccine at local pharmacy or Health Dept. Verbalized acceptance and understanding.  Screening Tests Health Maintenance  Topic Date Due   DTaP/Tdap/Td (1 - Tdap) Never done   Zoster Vaccines- Shingrix (1 of 2) Never done   COVID-19 Vaccine (6 - 2023-24 season) 10/24/2021   INFLUENZA VACCINE  09/24/2022   Medicare Annual Wellness (AWV)  07/14/2023   Pneumonia Vaccine 72+ Years old  Completed   DEXA SCAN  Completed   HPV VACCINES  Aged Out    Health Maintenance  Health Maintenance Due  Topic Date Due   DTaP/Tdap/Td (1 - Tdap) Never done   Zoster Vaccines- Shingrix (1 of 2) Never done   COVID-19 Vaccine (6 - 2023-24 season) 10/24/2021    Colorectal cancer screening: No longer required.   Mammogram status: No longer required due to age/patient denial.  Bone Density status: Ordered 07/14/2022. Pt provided with contact info and advised to call to schedule appt.  Lung Cancer Screening: (Low Dose CT Chest recommended if Age 32-80 years, 30 pack-year currently smoking OR have quit w/in 15years.) does not qualify.   Lung Cancer Screening Referral: no  Additional Screening:  Hepatitis C Screening: does not qualify; Completed: no  Vision Screening: Recommended annual ophthalmology exams for early detection of glaucoma and other disorders of the eye. Is the patient up to date with their annual eye exam?  Yes  Who is the provider or what is the name of the office in which the patient attends annual eye exams? Shari Prows, MD. If pt is not established with a provider, would they like to be referred to a provider to establish care? No .   Dental Screening: Recommended annual dental exams for proper oral hygiene  Community Resource Referral / Chronic  Care Management: CRR required this visit?  No   CCM required this visit?  No      Plan:     I have personally reviewed and noted the following in the patient's chart:   Medical and social history Use of alcohol, tobacco or illicit drugs  Current medications and supplements including opioid prescriptions. Patient is not currently taking opioid prescriptions. Functional ability and status Nutritional status Physical activity Advanced directives List of other physicians Hospitalizations, surgeries, and ER visits in previous 12 months Vitals Screenings to include cognitive, depression, and falls Referrals and appointments  In addition, I have reviewed and discussed with patient certain preventive protocols, quality metrics, and best practice recommendations. A written personalized care plan for preventive services as well as general preventive health recommendations were provided to patient.     Mickeal Needy, LPN   [id number]   Nurse Notes: Normal cognitive status assessed by direct observation by this Nurse Health Advisor. No abnormalities found.

## 2022-07-14 NOTE — Patient Instructions (Addendum)
Ms. Doehring , Thank you for taking time to come for your Medicare Wellness Visit. I appreciate your ongoing commitment to your health goals. Please review the following plan we discussed and let me know if I can assist you in the future.   These are the goals we discussed:  Goals      My goal for 2024 is to lose weight.  My weight goal is to be around 145-150 pounds.        This is a list of the screening recommended for you and due dates:  Health Maintenance  Topic Date Due   DTaP/Tdap/Td vaccine (1 - Tdap) Never done   Zoster (Shingles) Vaccine (1 of 2) Never done   COVID-19 Vaccine (6 - 2023-24 season) 10/24/2021   Flu Shot  09/24/2022   Medicare Annual Wellness Visit  07/14/2023   Pneumonia Vaccine  Completed   DEXA scan (bone density measurement)  Completed   HPV Vaccine  Aged Out    Advanced directives: Yes; Please bring a copy of your health care power of attorney and living will to the office at your convenience.  Conditions/risks identified: Yes; Lose Weight  Next appointment: Follow up in one year for your annual wellness visit.   Preventive Care 61 Years and Older, Female Preventive care refers to lifestyle choices and visits with your health care provider that can promote health and wellness. What does preventive care include? A yearly physical exam. This is also called an annual well check. Dental exams once or twice a year. Routine eye exams. Ask your health care provider how often you should have your eyes checked. Personal lifestyle choices, including: Daily care of your teeth and gums. Regular physical activity. Eating a healthy diet. Avoiding tobacco and drug use. Limiting alcohol use. Practicing safe sex. Taking low-dose aspirin every day. Taking vitamin and mineral supplements as recommended by your health care provider. What happens during an annual well check? The services and screenings done by your health care provider during your annual well check  will depend on your age, overall health, lifestyle risk factors, and family history of disease. Counseling  Your health care provider may ask you questions about your: Alcohol use. Tobacco use. Drug use. Emotional well-being. Home and relationship well-being. Sexual activity. Eating habits. History of falls. Memory and ability to understand (cognition). Work and work Astronomer. Reproductive health. Screening  You may have the following tests or measurements: Height, weight, and BMI. Blood pressure. Lipid and cholesterol levels. These may be checked every 5 years, or more frequently if you are over 31 years old. Skin check. Lung cancer screening. You may have this screening every year starting at age 82 if you have a 30-pack-year history of smoking and currently smoke or have quit within the past 15 years. Fecal occult blood test (FOBT) of the stool. You may have this test every year starting at age 82. Flexible sigmoidoscopy or colonoscopy. You may have a sigmoidoscopy every 5 years or a colonoscopy every 10 years starting at age 53. Hepatitis C blood test. Hepatitis B blood test. Sexually transmitted disease (STD) testing. Diabetes screening. This is done by checking your blood sugar (glucose) after you have not eaten for a while (fasting). You may have this done every 1-3 years. Bone density scan. This is done to screen for osteoporosis. You may have this done starting at age 82. Mammogram. This may be done every 1-2 years. Talk to your health care provider about how often you should have  regular mammograms. Talk with your health care provider about your test results, treatment options, and if necessary, the need for more tests. Vaccines  Your health care provider may recommend certain vaccines, such as: Influenza vaccine. This is recommended every year. Tetanus, diphtheria, and acellular pertussis (Tdap, Td) vaccine. You may need a Td booster every 10 years. Zoster vaccine. You  may need this after age 31. Pneumococcal 13-valent conjugate (PCV13) vaccine. One dose is recommended after age 82. Pneumococcal polysaccharide (PPSV23) vaccine. One dose is recommended after age 82. Talk to your health care provider about which screenings and vaccines you need and how often you need them. This information is not intended to replace advice given to you by your health care provider. Make sure you discuss any questions you have with your health care provider. Document Released: 03/08/2015 Document Revised: 10/30/2015 Document Reviewed: 12/11/2014 Elsevier Interactive Patient Education  2017 [employer].  Fall Prevention in the Home Falls can cause injuries. They can happen to people of all ages. There are many things you can do to make your home safe and to help prevent falls. What can I do on the outside of my home? Regularly fix the edges of walkways and driveways and fix any cracks. Remove anything that might make you trip as you walk through a door, such as a raised step or threshold. Trim any bushes or trees on the path to your home. Use bright outdoor lighting. Clear any walking paths of anything that might make someone trip, such as rocks or tools. Regularly check to see if handrails are loose or broken. Make sure that both sides of any steps have handrails. Any raised decks and porches should have guardrails on the edges. Have any leaves, snow, or ice cleared regularly. Use sand or salt on walking paths during winter. Clean up any spills in your garage right away. This includes oil or grease spills. What can I do in the bathroom? Use night lights. Install grab bars by the toilet and in the tub and shower. Do not use towel bars as grab bars. Use non-skid mats or decals in the tub or shower. If you need to sit down in the shower, use a plastic, non-slip stool. Keep the floor dry. Clean up any water that spills on the floor as soon as it happens. Remove soap buildup  in the tub or shower regularly. Attach bath mats securely with double-sided non-slip rug tape. Do not have throw rugs and other things on the floor that can make you trip. What can I do in the bedroom? Use night lights. Make sure that you have a light by your bed that is easy to reach. Do not use any sheets or blankets that are too big for your bed. They should not hang down onto the floor. Have a firm chair that has side arms. You can use this for support while you get dressed. Do not have throw rugs and other things on the floor that can make you trip. What can I do in the kitchen? Clean up any spills right away. Avoid walking on wet floors. Keep items that you use a lot in easy-to-reach places. If you need to reach something above you, use a strong step stool that has a grab bar. Keep electrical cords out of the way. Do not use floor polish or wax that makes floors slippery. If you must use wax, use non-skid floor wax. Do not have throw rugs and other things on the floor that  can make you trip. What can I do with my stairs? Do not leave any items on the stairs. Make sure that there are handrails on both sides of the stairs and use them. Fix handrails that are broken or loose. Make sure that handrails are as long as the stairways. Check any carpeting to make sure that it is firmly attached to the stairs. Fix any carpet that is loose or worn. Avoid having throw rugs at the top or bottom of the stairs. If you do have throw rugs, attach them to the floor with carpet tape. Make sure that you have a light switch at the top of the stairs and the bottom of the stairs. If you do not have them, ask someone to add them for you. What else can I do to help prevent falls? Wear shoes that: Do not have high heels. Have rubber bottoms. Are comfortable and fit you well. Are closed at the toe. Do not wear sandals. If you use a stepladder: Make sure that it is fully opened. Do not climb a closed  stepladder. Make sure that both sides of the stepladder are locked into place. Ask someone to hold it for you, if possible. Clearly mark and make sure that you can see: Any grab bars or handrails. First and last steps. Where the edge of each step is. Use tools that help you move around (mobility aids) if they are needed. These include: Canes. Walkers. Scooters. Crutches. Turn on the lights when you go into a dark area. Replace any light bulbs as soon as they burn out. Set up your furniture so you have a clear path. Avoid moving your furniture around. If any of your floors are uneven, fix them. If there are any pets around you, be aware of where they are. Review your medicines with your doctor. Some medicines can make you feel dizzy. This can increase your chance of falling. Ask your doctor what other things that you can do to help prevent falls. This information is not intended to replace advice given to you by your health care provider. Make sure you discuss any questions you have with your health care provider. Document Released: 12/06/2008 Document Revised: 07/18/2015 Document Reviewed: 03/16/2014 Elsevier Interactive Patient Education  2017 [employer].

## 2022-08-04 ENCOUNTER — Ambulatory Visit (INDEPENDENT_AMBULATORY_CARE_PROVIDER_SITE_OTHER): Payer: PPO | Admitting: Emergency Medicine

## 2022-08-04 VITALS — BP 138/86 | HR 75 | Temp 98.4°F | Ht 59.0 in | Wt 186.2 lb

## 2022-08-04 DIAGNOSIS — I10 Essential (primary) hypertension: Secondary | ICD-10-CM

## 2022-08-04 DIAGNOSIS — E785 Hyperlipidemia, unspecified: Secondary | ICD-10-CM | POA: Diagnosis not present

## 2022-08-04 DIAGNOSIS — R7303 Prediabetes: Secondary | ICD-10-CM | POA: Diagnosis not present

## 2022-08-04 MED ORDER — CYCLOBENZAPRINE HCL 5 MG PO TABS: 5.0000 mg | ORAL_TABLET | Freq: Three times a day (TID) | ORAL | 1 refills | Status: DC | PRN

## 2022-08-04 NOTE — Assessment & Plan Note (Signed)
Stable chronic condition Presently off medication

## 2022-08-04 NOTE — Assessment & Plan Note (Signed)
Diet and nutrition discussed Stable condition

## 2022-08-04 NOTE — Assessment & Plan Note (Signed)
BP Readings from Last 3 Encounters:  08/04/22 138/86  02/05/22 130/80  11/24/21 128/80  Well-controlled hypertension Continue diltiazem 240 mg daily Cardiovascular risks associated with hypertension discussed

## 2022-08-04 NOTE — Progress Notes (Signed)
Gina Guerrero 82 y.o.   Chief Complaint  Patient presents with   Medical Management of Chronic Issues    f/u appt, no concerns    HISTORY OF PRESENT ILLNESS: This is a 82 y.o. female here for 59-month follow-up of chronic medical conditions including hypertension Overall doing well.  Has no complaints or medical concerns today.  HPI   Prior to Admission medications   Medication Sig Start Date End Date Taking? Authorizing Provider  albuterol (VENTOLIN HFA) 108 (90 Base) MCG/ACT inhaler Inhale 2 puffs into the lungs every 4 (four) hours as needed for wheezing or shortness of breath (cough, shortness of breath or wheezing.). 08/26/18  Yes Law, Alexandra M, PA-C  aspirin EC 81 MG tablet Take 81 mg by mouth daily.   Yes [provider]  azelastine (ASTELIN) 0.1 % nasal spray Place 2 sprays into both nostrils 2 (two) times daily as needed for rhinitis. 03/02/19  Yes Alfonse Spruce, MD  Cholecalciferol (VITAMIN D) 50 MCG (2000 UT) CAPS Take 1 capsule (2,000 Units total) by mouth daily. 02/05/22  Yes Anthon Harpole, Eilleen Kempf, MD  diltiazem (CARDIZEM CD) 240 MG 24 hr capsule Take 1 capsule (240 mg total) by mouth daily. 02/05/22 01/31/23 Yes Akiya Morr, Eilleen Kempf, MD  Multiple Vitamins-Minerals (PRESERVISION AREDS 2 PO) Take 1 tablet by mouth 2 (two) times daily.   Yes [provider]  propranolol (INDERAL) 40 MG tablet TAKE 1 TABLET(40MG ) BY MOUTH THREE TIMES DAILY AS NEEDED(HEART RACING) 05/15/22  Yes Yates Decamp, MD  triamcinolone cream (KENALOG) 0.1 % Apply 1 application  topically 2 (two) times daily. 08/06/21  Yes [employer], Eilleen Kempf, MD  cyclobenzaprine (FLEXERIL) 5 MG tablet Take 1 tablet (5 mg total) by mouth 3 (three) times daily as needed for muscle spasms. 08/04/22   Georgina Quint, MD    No Known Allergies  Patient Active Problem List   Diagnosis Date Noted   Essential hypertension 08/06/2021   Dyslipidemia 08/06/2021   Allergic dermatitis  08/06/2021   Prediabetes 04/23/2020   Mixed hyperlipidemia 04/23/2020   Osteopenia determined by x-ray 11/2018   SVT (supraventricular tachycardia) 09/14/2018   Perennial allergic rhinitis 08/30/2018   Chronic cough 05/13/2016    Past Medical History:  Diagnosis Date   Asthma    Cataract    Hyperlipidemia    Hypertension    Osteopenia determined by x-ray 11/2018   low FRAX score   Tachycardia     Past Surgical History:  Procedure Laterality Date   ABDOMINAL HYSTERECTOMY     EYE SURGERY Bilateral 2013   TOE SURGERY      Social History   Socioeconomic History   Marital status: Married    Spouse name: Not on file   Number of children: 0   Years of education: Not on file   Highest education level: 12th grade  Occupational History   Not on file  Tobacco Use   Smoking status: Never   Smokeless tobacco: Never  Vaping Use   Vaping Use: Never used  Substance and Sexual Activity   Alcohol use: No   Drug use: Never   Sexual activity: Not on file  Other Topics Concern   Not on file  Social History Narrative   Lives with husband and is currently taking care of her niece who is dying of brain cancer.     No children.   Social Determinants of Health   Financial Resource Strain: Low Risk  (08/04/2022)   Overall Financial Resource  Strain (CARDIA)    Difficulty of Paying Living Expenses: Not very hard  Food Insecurity: No Food Insecurity (08/04/2022)   Hunger Vital Sign    Worried About Running Out of Food in the Last Year: Never true    Ran Out of Food in the Last Year: Never true  Transportation Needs: No Transportation Needs (08/04/2022)   PRAPARE - Administrator, Civil Service (Medical): No    Lack of Transportation (Non-Medical): No  Physical Activity: Insufficiently Active (08/04/2022)   Exercise Vital Sign    Days of Exercise per Week: 2 days    Minutes of Exercise per Session: 10 min  Stress: No Stress Concern Present (08/04/2022)   [employer]  of Occupational Health - Occupational Stress Questionnaire    Feeling of Stress : Not at all  Social Connections: Socially Integrated (08/04/2022)   Social Connection and Isolation Panel [NHANES]    Frequency of Communication with Friends and Family: More than three times a week    Frequency of Social Gatherings with Friends and Family: Three times a week    Attends Religious Services: More than 4 times per year    Active Member of Clubs or Organizations: Yes    Attends Banker Meetings: More than 4 times per year    Marital Status: Married  Catering manager Violence: Not At Risk (07/14/2022)   Humiliation, Afraid, Rape, and Kick questionnaire    Fear of Current or Ex-Partner: No    Emotionally Abused: No    Physically Abused: No    Sexually Abused: No    Family History  Problem Relation Age of Onset   Alopecia Father    Heart disease Sister    COPD Brother    Allergic rhinitis Neg Hx    Angioedema Neg Hx    Asthma Neg Hx    Eczema Neg Hx    Immunodeficiency Neg Hx    Urticaria Neg Hx      Review of Systems  Constitutional: Negative.  Negative for chills and fever.  HENT: Negative.  Negative for congestion and sore throat.   Respiratory: Negative.  Negative for cough and shortness of breath.   Cardiovascular: Negative.  Negative for chest pain and palpitations.  Gastrointestinal:  Negative for abdominal pain, nausea and vomiting.  Genitourinary: Negative.  Negative for dysuria.  Skin: Negative.  Negative for rash.  Neurological: Negative.  Negative for dizziness and headaches.  All other systems reviewed and are negative.   Vitals:   08/04/22 1604  BP: 138/86  Pulse: 75  Temp: 98.4 F (36.9 C)  SpO2: 96%    Physical Exam Vitals reviewed.  Constitutional:      Appearance: Normal appearance.  HENT:     Head: Normocephalic.     Mouth/Throat:     Mouth: Mucous membranes are moist.     Pharynx: Oropharynx is clear.  Eyes:     Extraocular Movements:  Extraocular movements intact.     Conjunctiva/sclera: Conjunctivae normal.     Pupils: Pupils are equal, round, and reactive to light.  Cardiovascular:     Rate and Rhythm: Normal rate and regular rhythm.     Pulses: Normal pulses.     Heart sounds: Normal heart sounds.  Pulmonary:     Effort: Pulmonary effort is normal.     Breath sounds: Normal breath sounds.  Abdominal:     Palpations: Abdomen is soft.     Tenderness: There is no abdominal tenderness.  Musculoskeletal:  Cervical back: No tenderness.  Lymphadenopathy:     Cervical: No cervical adenopathy.  Skin:    General: Skin is warm and dry.     Capillary Refill: Capillary refill takes less than 2 seconds.  Neurological:     General: No focal deficit present.     Mental Status: She is alert and oriented to person, place, and time.  Psychiatric:        Mood and Affect: Mood normal.        Behavior: Behavior normal.      ASSESSMENT & PLAN: A total of 42 minutes was spent with the patient and counseling/coordination of care regarding preparing for this visit, review of most recent office visit notes, review of multiple chronic medical conditions under management, review of most recent blood work results, review of all medications, cardiovascular risks associated with hypertension and dyslipidemia, education on nutrition, prognosis, documentation, and need for follow-up.  Problem List Items Addressed This Visit       Cardiovascular and Mediastinum   Essential hypertension - Primary    BP Readings from Last 3 Encounters:  08/04/22 138/86  02/05/22 130/80  11/24/21 128/80  Well-controlled hypertension Continue diltiazem 240 mg daily Cardiovascular risks associated with hypertension discussed         Other   Prediabetes    Diet and nutrition discussed Stable condition      Dyslipidemia    Stable chronic condition Presently off medication      Patient Instructions  Hypertension, Adult High blood  pressure (hypertension) is when the force of blood pumping through the arteries is too strong. The arteries are the blood vessels that carry blood from the heart throughout the body. Hypertension forces the heart to work harder to pump blood and may cause arteries to become narrow or stiff. Untreated or uncontrolled hypertension can lead to a heart attack, heart failure, a stroke, kidney disease, and other problems. A blood pressure reading consists of a higher number over a lower number. Ideally, your blood pressure should be below 120/80. The first ("top") number is called the systolic pressure. It is a measure of the pressure in your arteries as your heart beats. The second ("bottom") number is called the diastolic pressure. It is a measure of the pressure in your arteries as the heart relaxes. What are the causes? The exact cause of this condition is not known. There are some conditions that result in high blood pressure. What increases the risk? Certain factors may make you more likely to develop high blood pressure. Some of these risk factors are under your control, including: Smoking. Not getting enough exercise or physical activity. Being overweight. Having too much fat, sugar, calories, or salt (sodium) in your diet. Drinking too much alcohol. Other risk factors include: Having a personal history of heart disease, diabetes, high cholesterol, or kidney disease. Stress. Having a family history of high blood pressure and high cholesterol. Having obstructive sleep apnea. Age. The risk increases with age. What are the signs or symptoms? High blood pressure may not cause symptoms. Very high blood pressure (hypertensive crisis) may cause: Headache. Fast or irregular heartbeats (palpitations). Shortness of breath. Nosebleed. Nausea and vomiting. Vision changes. Severe chest pain, dizziness, and seizures. How is this diagnosed? This condition is diagnosed by measuring your blood pressure  while you are seated, with your arm resting on a flat surface, your legs uncrossed, and your feet flat on the floor. The cuff of the blood pressure monitor will be placed directly against  the skin of your upper arm at the level of your heart. Blood pressure should be measured at least twice using the same arm. Certain conditions can cause a difference in blood pressure between your right and left arms. If you have a high blood pressure reading during one visit or you have normal blood pressure with other risk factors, you may be asked to: Return on a different day to have your blood pressure checked again. Monitor your blood pressure at home for 1 week or longer. If you are diagnosed with hypertension, you may have other blood or imaging tests to help your health care provider understand your overall risk for other conditions. How is this treated? This condition is treated by making healthy lifestyle changes, such as eating healthy foods, exercising more, and reducing your alcohol intake. You may be referred for counseling on a healthy diet and physical activity. Your health care provider may prescribe medicine if lifestyle changes are not enough to get your blood pressure under control and if: Your systolic blood pressure is above 130. Your diastolic blood pressure is above 80. Your personal target blood pressure may vary depending on your medical conditions, your age, and other factors. Follow these instructions at home: Eating and drinking  Eat a diet that is high in fiber and potassium, and low in sodium, added sugar, and fat. An example of this eating plan is called the DASH diet. DASH stands for Dietary Approaches to Stop Hypertension. To eat this way: Eat plenty of fresh fruits and vegetables. Try to fill one half of your plate at each meal with fruits and vegetables. Eat whole grains, such as whole-wheat pasta, [place] rice, or whole-grain bread. Fill about one fourth of your plate with whole  grains. Eat or drink low-fat dairy products, such as skim milk or low-fat yogurt. Avoid fatty cuts of meat, processed or cured meats, and poultry with skin. Fill about one fourth of your plate with lean proteins, such as fish, chicken without skin, beans, eggs, or tofu. Avoid pre-made and processed foods. These tend to be higher in sodium, added sugar, and fat. Reduce your daily sodium intake. Many people with hypertension should eat less than 1,500 mg of sodium a day. Do not drink alcohol if: Your health care provider tells you not to drink. You are pregnant, may be pregnant, or are planning to become pregnant. If you drink alcohol: Limit how much you have to: 0-1 drink a day for women. 0-2 drinks a day for men. Know how much alcohol is in your drink. In the [place] one drink equals one 12 oz bottle of beer (355 mL), one 5 oz glass of wine (148 mL), or one 1 oz glass of hard liquor (44 mL). Lifestyle  Work with your health care provider to maintain a healthy body weight or to lose weight. Ask what an ideal weight is for you. Get at least 30 minutes of exercise that causes your heart to beat faster (aerobic exercise) most days of the week. Activities may include walking, swimming, or biking. Include exercise to strengthen your muscles (resistance exercise), such as Pilates or lifting weights, as part of your weekly exercise routine. Try to do these types of exercises for 30 minutes at least 3 days a week. Do not use any products that contain nicotine or tobacco. These products include cigarettes, chewing tobacco, and vaping devices, such as e-cigarettes. If you need help quitting, ask your health care provider. Monitor your blood pressure at home as  told by your health care provider. Keep all follow-up visits. This is important. Medicines Take over-the-counter and prescription medicines only as told by your health care provider. Follow directions carefully. Blood pressure medicines must be taken  as prescribed. Do not skip doses of blood pressure medicine. Doing this puts you at risk for problems and can make the medicine less effective. Ask your health care provider about side effects or reactions to medicines that you should watch for. Contact a health care provider if you: Think you are having a reaction to a medicine you are taking. Have headaches that keep coming back (recurring). Feel dizzy. Have swelling in your ankles. Have trouble with your vision. Get help right away if you: Develop a severe headache or confusion. Have unusual weakness or numbness. Feel faint. Have severe pain in your chest or abdomen. Vomit repeatedly. Have trouble breathing. These symptoms may be an emergency. Get help right away. Call 911. Do not wait to see if the symptoms will go away. Do not drive yourself to the hospital. Summary Hypertension is when the force of blood pumping through your arteries is too strong. If this condition is not controlled, it may put you at risk for serious complications. Your personal target blood pressure may vary depending on your medical conditions, your age, and other factors. For most people, a normal blood pressure is less than 120/80. Hypertension is treated with lifestyle changes, medicines, or a combination of both. Lifestyle changes include losing weight, eating a healthy, low-sodium diet, exercising more, and limiting alcohol. This information is not intended to replace advice given to you by your health care provider. Make sure you discuss any questions you have with your health care provider. Document Revised: 12/17/2020 Document Reviewed: 12/17/2020 Elsevier Patient Education  2024 Elsevier Inc.    Edwina Barth, MD [facility]

## 2022-08-04 NOTE — Patient Instructions (Signed)
Hypertension, Adult High blood pressure (hypertension) is when the force of blood pumping through the arteries is too strong. The arteries are the blood vessels that carry blood from the heart throughout the body. Hypertension forces the heart to work harder to pump blood and may cause arteries to become narrow or stiff. Untreated or uncontrolled hypertension can lead to a heart attack, heart failure, a stroke, kidney disease, and other problems. A blood pressure reading consists of a higher number over a lower number. Ideally, your blood pressure should be below 120/80. The first ("top") number is called the systolic pressure. It is a measure of the pressure in your arteries as your heart beats. The second ("bottom") number is called the diastolic pressure. It is a measure of the pressure in your arteries as the heart relaxes. What are the causes? The exact cause of this condition is not known. There are some conditions that result in high blood pressure. What increases the risk? Certain factors may make you more likely to develop high blood pressure. Some of these risk factors are under your control, including: Smoking. Not getting enough exercise or physical activity. Being overweight. Having too much fat, sugar, calories, or salt (sodium) in your diet. Drinking too much alcohol. Other risk factors include: Having a personal history of heart disease, diabetes, high cholesterol, or kidney disease. Stress. Having a family history of high blood pressure and high cholesterol. Having obstructive sleep apnea. Age. The risk increases with age. What are the signs or symptoms? High blood pressure may not cause symptoms. Very high blood pressure (hypertensive crisis) may cause: Headache. Fast or irregular heartbeats (palpitations). Shortness of breath. Nosebleed. Nausea and vomiting. Vision changes. Severe chest pain, dizziness, and seizures. How is this diagnosed? This condition is diagnosed by  measuring your blood pressure while you are seated, with your arm resting on a flat surface, your legs uncrossed, and your feet flat on the floor. The cuff of the blood pressure monitor will be placed directly against the skin of your upper arm at the level of your heart. Blood pressure should be measured at least twice using the same arm. Certain conditions can cause a difference in blood pressure between your right and left arms. If you have a high blood pressure reading during one visit or you have normal blood pressure with other risk factors, you may be asked to: Return on a different day to have your blood pressure checked again. Monitor your blood pressure at home for 1 week or longer. If you are diagnosed with hypertension, you may have other blood or imaging tests to help your health care provider understand your overall risk for other conditions. How is this treated? This condition is treated by making healthy lifestyle changes, such as eating healthy foods, exercising more, and reducing your alcohol intake. You may be referred for counseling on a healthy diet and physical activity. Your health care provider may prescribe medicine if lifestyle changes are not enough to get your blood pressure under control and if: Your systolic blood pressure is above 130. Your diastolic blood pressure is above 80. Your personal target blood pressure may vary depending on your medical conditions, your age, and other factors. Follow these instructions at home: Eating and drinking  Eat a diet that is high in fiber and potassium, and low in sodium, added sugar, and fat. An example of this eating plan is called the DASH diet. DASH stands for Dietary Approaches to Stop Hypertension. To eat this way: Eat   plenty of fresh fruits and vegetables. Try to fill one half of your plate at each meal with fruits and vegetables. Eat whole grains, such as whole-wheat pasta, Bong rice, or whole-grain bread. Fill about one  fourth of your plate with whole grains. Eat or drink low-fat dairy products, such as skim milk or low-fat yogurt. Avoid fatty cuts of meat, processed or cured meats, and poultry with skin. Fill about one fourth of your plate with lean proteins, such as fish, chicken without skin, beans, eggs, or tofu. Avoid pre-made and processed foods. These tend to be higher in sodium, added sugar, and fat. Reduce your daily sodium intake. Many people with hypertension should eat less than 1,500 mg of sodium a day. Do not drink alcohol if: Your health care provider tells you not to drink. You are pregnant, may be pregnant, or are planning to become pregnant. If you drink alcohol: Limit how much you have to: 0-1 drink a day for women. 0-2 drinks a day for men. Know how much alcohol is in your drink. In the [place] one drink equals one 12 oz bottle of beer (355 mL), one 5 oz glass of wine (148 mL), or one 1 oz glass of hard liquor (44 mL). Lifestyle  Work with your health care provider to maintain a healthy body weight or to lose weight. Ask what an ideal weight is for you. Get at least 30 minutes of exercise that causes your heart to beat faster (aerobic exercise) most days of the week. Activities may include walking, swimming, or biking. Include exercise to strengthen your muscles (resistance exercise), such as Pilates or lifting weights, as part of your weekly exercise routine. Try to do these types of exercises for 30 minutes at least 3 days a week. Do not use any products that contain nicotine or tobacco. These products include cigarettes, chewing tobacco, and vaping devices, such as e-cigarettes. If you need help quitting, ask your health care provider. Monitor your blood pressure at home as told by your health care provider. Keep all follow-up visits. This is important. Medicines Take over-the-counter and prescription medicines only as told by your health care provider. Follow directions carefully. Blood  pressure medicines must be taken as prescribed. Do not skip doses of blood pressure medicine. Doing this puts you at risk for problems and can make the medicine less effective. Ask your health care provider about side effects or reactions to medicines that you should watch for. Contact a health care provider if you: Think you are having a reaction to a medicine you are taking. Have headaches that keep coming back (recurring). Feel dizzy. Have swelling in your ankles. Have trouble with your vision. Get help right away if you: Develop a severe headache or confusion. Have unusual weakness or numbness. Feel faint. Have severe pain in your chest or abdomen. Vomit repeatedly. Have trouble breathing. These symptoms may be an emergency. Get help right away. Call 911. Do not wait to see if the symptoms will go away. Do not drive yourself to the hospital. Summary Hypertension is when the force of blood pumping through your arteries is too strong. If this condition is not controlled, it may put you at risk for serious complications. Your personal target blood pressure may vary depending on your medical conditions, your age, and other factors. For most people, a normal blood pressure is less than 120/80. Hypertension is treated with lifestyle changes, medicines, or a combination of both. Lifestyle changes include losing weight, eating a healthy,   low-sodium diet, exercising more, and limiting alcohol. This information is not intended to replace advice given to you by your health care provider. Make sure you discuss any questions you have with your health care provider. Document Revised: 12/17/2020 Document Reviewed: 12/17/2020 Elsevier Patient Education  2024 Elsevier Inc.  

## 2022-08-06 ENCOUNTER — Ambulatory Visit: Payer: PPO | Admitting: Emergency Medicine

## 2022-12-17 ENCOUNTER — Other Ambulatory Visit: Payer: Self-pay | Admitting: Emergency Medicine

## 2023-02-04 ENCOUNTER — Ambulatory Visit (INDEPENDENT_AMBULATORY_CARE_PROVIDER_SITE_OTHER): Payer: PPO | Admitting: Emergency Medicine

## 2023-02-04 ENCOUNTER — Encounter: Payer: Self-pay | Admitting: Emergency Medicine

## 2023-02-04 VITALS — BP 124/72 | HR 75 | Temp 97.7°F | Ht 59.0 in | Wt 178.0 lb

## 2023-02-04 DIAGNOSIS — R7303 Prediabetes: Secondary | ICD-10-CM | POA: Diagnosis not present

## 2023-02-04 DIAGNOSIS — E785 Hyperlipidemia, unspecified: Secondary | ICD-10-CM

## 2023-02-04 DIAGNOSIS — I10 Essential (primary) hypertension: Secondary | ICD-10-CM

## 2023-02-04 NOTE — Patient Instructions (Signed)
Hypertension, Adult High blood pressure (hypertension) is when the force of blood pumping through the arteries is too strong. The arteries are the blood vessels that carry blood from the heart throughout the body. Hypertension forces the heart to work harder to pump blood and may cause arteries to become narrow or stiff. Untreated or uncontrolled hypertension can lead to a heart attack, heart failure, a stroke, kidney disease, and other problems. A blood pressure reading consists of a higher number over a lower number. Ideally, your blood pressure should be below 120/80. The first ("top") number is called the systolic pressure. It is a measure of the pressure in your arteries as your heart beats. The second ("bottom") number is called the diastolic pressure. It is a measure of the pressure in your arteries as the heart relaxes. What are the causes? The exact cause of this condition is not known. There are some conditions that result in high blood pressure. What increases the risk? Certain factors may make you more likely to develop high blood pressure. Some of these risk factors are under your control, including: Smoking. Not getting enough exercise or physical activity. Being overweight. Having too much fat, sugar, calories, or salt (sodium) in your diet. Drinking too much alcohol. Other risk factors include: Having a personal history of heart disease, diabetes, high cholesterol, or kidney disease. Stress. Having a family history of high blood pressure and high cholesterol. Having obstructive sleep apnea. Age. The risk increases with age. What are the signs or symptoms? High blood pressure may not cause symptoms. Very high blood pressure (hypertensive crisis) may cause: Headache. Fast or irregular heartbeats (palpitations). Shortness of breath. Nosebleed. Nausea and vomiting. Vision changes. Severe chest pain, dizziness, and seizures. How is this diagnosed? This condition is diagnosed by  measuring your blood pressure while you are seated, with your arm resting on a flat surface, your legs uncrossed, and your feet flat on the floor. The cuff of the blood pressure monitor will be placed directly against the skin of your upper arm at the level of your heart. Blood pressure should be measured at least twice using the same arm. Certain conditions can cause a difference in blood pressure between your right and left arms. If you have a high blood pressure reading during one visit or you have normal blood pressure with other risk factors, you may be asked to: Return on a different day to have your blood pressure checked again. Monitor your blood pressure at home for 1 week or longer. If you are diagnosed with hypertension, you may have other blood or imaging tests to help your health care provider understand your overall risk for other conditions. How is this treated? This condition is treated by making healthy lifestyle changes, such as eating healthy foods, exercising more, and reducing your alcohol intake. You may be referred for counseling on a healthy diet and physical activity. Your health care provider may prescribe medicine if lifestyle changes are not enough to get your blood pressure under control and if: Your systolic blood pressure is above 130. Your diastolic blood pressure is above 80. Your personal target blood pressure may vary depending on your medical conditions, your age, and other factors. Follow these instructions at home: Eating and drinking  Eat a diet that is high in fiber and potassium, and low in sodium, added sugar, and fat. An example of this eating plan is called the DASH diet. DASH stands for Dietary Approaches to Stop Hypertension. To eat this way: Eat   plenty of fresh fruits and vegetables. Try to fill one half of your plate at each meal with fruits and vegetables. Eat whole grains, such as whole-wheat pasta, brown rice, or whole-grain bread. Fill about one  fourth of your plate with whole grains. Eat or drink low-fat dairy products, such as skim milk or low-fat yogurt. Avoid fatty cuts of meat, processed or cured meats, and poultry with skin. Fill about one fourth of your plate with lean proteins, such as fish, chicken without skin, beans, eggs, or tofu. Avoid pre-made and processed foods. These tend to be higher in sodium, added sugar, and fat. Reduce your daily sodium intake. Many people with hypertension should eat less than 1,500 mg of sodium a day. Do not drink alcohol if: Your health care provider tells you not to drink. You are pregnant, may be pregnant, or are planning to become pregnant. If you drink alcohol: Limit how much you have to: 0-1 drink a day for women. 0-2 drinks a day for men. Know how much alcohol is in your drink. In the [place] one drink equals one 12 oz bottle of beer (355 mL), one 5 oz glass of wine (148 mL), or one 1 oz glass of hard liquor (44 mL). Lifestyle  Work with your health care provider to maintain a healthy body weight or to lose weight. Ask what an ideal weight is for you. Get at least 30 minutes of exercise that causes your heart to beat faster (aerobic exercise) most days of the week. Activities may include walking, swimming, or biking. Include exercise to strengthen your muscles (resistance exercise), such as Pilates or lifting weights, as part of your weekly exercise routine. Try to do these types of exercises for 30 minutes at least 3 days a week. Do not use any products that contain nicotine or tobacco. These products include cigarettes, chewing tobacco, and vaping devices, such as e-cigarettes. If you need help quitting, ask your health care provider. Monitor your blood pressure at home as told by your health care provider. Keep all follow-up visits. This is important. Medicines Take over-the-counter and prescription medicines only as told by your health care provider. Follow directions carefully. Blood  pressure medicines must be taken as prescribed. Do not skip doses of blood pressure medicine. Doing this puts you at risk for problems and can make the medicine less effective. Ask your health care provider about side effects or reactions to medicines that you should watch for. Contact a health care provider if you: Think you are having a reaction to a medicine you are taking. Have headaches that keep coming back (recurring). Feel dizzy. Have swelling in your ankles. Have trouble with your vision. Get help right away if you: Develop a severe headache or confusion. Have unusual weakness or numbness. Feel faint. Have severe pain in your chest or abdomen. Vomit repeatedly. Have trouble breathing. These symptoms may be an emergency. Get help right away. Call 911. Do not wait to see if the symptoms will go away. Do not drive yourself to the hospital. Summary Hypertension is when the force of blood pumping through your arteries is too strong. If this condition is not controlled, it may put you at risk for serious complications. Your personal target blood pressure may vary depending on your medical conditions, your age, and other factors. For most people, a normal blood pressure is less than 120/80. Hypertension is treated with lifestyle changes, medicines, or a combination of both. Lifestyle changes include losing weight, eating a healthy,   low-sodium diet, exercising more, and limiting alcohol. This information is not intended to replace advice given to you by your health care provider. Make sure you discuss any questions you have with your health care provider. Document Revised: 12/17/2020 Document Reviewed: 12/17/2020 Elsevier Patient Education  2024 Elsevier Inc.  

## 2023-02-04 NOTE — Assessment & Plan Note (Signed)
Diet and nutrition discussed Stable condition

## 2023-02-04 NOTE — Progress Notes (Signed)
Gina Guerrero 82 y.o.   Chief Complaint  Patient presents with   Follow-up    6 month f/u for b/p    HISTORY OF PRESENT ILLNESS: This is a 82 y.o. female A1A here for 36-month follow-up of chronic medical conditions including hypertension Overall doing well.  Has no complaints or medical concerns today. BP Readings from Last 3 Encounters:  02/04/23 124/72  08/04/22 138/86  02/05/22 130/80   Wt Readings from Last 3 Encounters:  02/04/23 178 lb (80.7 kg)  08/04/22 186 lb 4 oz (84.5 kg)  07/14/22 180 lb (81.6 kg)     HPI   Prior to Admission medications   Medication Sig Start Date End Date Taking? Authorizing Provider  albuterol (VENTOLIN HFA) 108 (90 Base) MCG/ACT inhaler Inhale 2 puffs into the lungs every 4 (four) hours as needed for wheezing or shortness of breath (cough, shortness of breath or wheezing.). 08/26/18  Yes Law, Alexandra M, PA-C  aspirin EC 81 MG tablet Take 81 mg by mouth daily.   Yes [provider]  azelastine (ASTELIN) 0.1 % nasal spray Place 2 sprays into both nostrils 2 (two) times daily as needed for rhinitis. 03/02/19  Yes Alfonse Spruce, MD  Cholecalciferol (VITAMIN D) 50 MCG (2000 UT) CAPS Take 1 capsule (2,000 Units total) by mouth daily. 02/05/22  Yes Brisha Mccabe, Eilleen Kempf, MD  cyclobenzaprine (FLEXERIL) 5 MG tablet TAKE 1 TABLET(5 MG) BY MOUTH THREE TIMES DAILY AS NEEDED FOR MUSCLE SPASMS 12/17/22  Yes Kc Sedlak, Eilleen Kempf, MD  Multiple Vitamins-Minerals (PRESERVISION AREDS 2 PO) Take 1 tablet by mouth 2 (two) times daily.   Yes [provider]  propranolol (INDERAL) 40 MG tablet TAKE 1 TABLET(40MG ) BY MOUTH THREE TIMES DAILY AS NEEDED(HEART RACING) 05/15/22  Yes Yates Decamp, MD  triamcinolone cream (KENALOG) 0.1 % Apply 1 application  topically 2 (two) times daily. 08/06/21  Yes Anistyn Graddy, Eilleen Kempf, MD  diltiazem (CARDIZEM CD) 240 MG 24 hr capsule Take 1 capsule (240 mg total) by mouth daily. 02/05/22 01/31/23  Georgina Quint, MD    No Known Allergies  Patient Active Problem List   Diagnosis Date Noted   Essential hypertension 08/06/2021   Dyslipidemia 08/06/2021   Allergic dermatitis 08/06/2021   Prediabetes 04/23/2020   Mixed hyperlipidemia 04/23/2020   Osteopenia determined by x-ray 11/2018   Perennial allergic rhinitis 08/30/2018   Chronic cough 05/13/2016    Past Medical History:  Diagnosis Date   Asthma    Cataract    Hyperlipidemia    Hypertension    Osteopenia determined by x-ray 11/2018   low FRAX score   Tachycardia     Past Surgical History:  Procedure Laterality Date   ABDOMINAL HYSTERECTOMY     EYE SURGERY Bilateral 2013   TOE SURGERY      Social History   Socioeconomic History   Marital status: Married    Spouse name: Not on file   Number of children: 0   Years of education: Not on file   Highest education level: 12th grade  Occupational History   Not on file  Tobacco Use   Smoking status: Never   Smokeless tobacco: Never  Vaping Use   Vaping status: Never Used  Substance and Sexual Activity   Alcohol use: No   Drug use: Never   Sexual activity: Not on file  Other Topics Concern   Not on file  Social History Narrative   Lives with husband and is currently taking care of  her niece who is dying of brain cancer.     No children.   Social Drivers of Corporate investment banker Strain: Low Risk  (08/04/2022)   Overall Financial Resource Strain (CARDIA)    Difficulty of Paying Living Expenses: Not very hard  Food Insecurity: No Food Insecurity (08/04/2022)   Hunger Vital Sign    Worried About Running Out of Food in the Last Year: Never true    Ran Out of Food in the Last Year: Never true  Transportation Needs: No Transportation Needs (08/04/2022)   PRAPARE - Administrator, Civil Service (Medical): No    Lack of Transportation (Non-Medical): No  Physical Activity: Insufficiently Active (08/04/2022)   Exercise Vital Sign    Days of Exercise per  Week: 2 days    Minutes of Exercise per Session: 10 min  Stress: No Stress Concern Present (08/04/2022)   [employer] of Occupational Health - Occupational Stress Questionnaire    Feeling of Stress : Not at all  Social Connections: Socially Integrated (08/04/2022)   Social Connection and Isolation Panel [NHANES]    Frequency of Communication with Friends and Family: More than three times a week    Frequency of Social Gatherings with Friends and Family: Three times a week    Attends Religious Services: More than 4 times per year    Active Member of Clubs or Organizations: Yes    Attends Banker Meetings: More than 4 times per year    Marital Status: Married  Catering manager Violence: Not At Risk (07/14/2022)   Humiliation, Afraid, Rape, and Kick questionnaire    Fear of Current or Ex-Partner: No    Emotionally Abused: No    Physically Abused: No    Sexually Abused: No    Family History  Problem Relation Age of Onset   Alopecia Father    Heart disease Sister    COPD Brother    Allergic rhinitis Neg Hx    Angioedema Neg Hx    Asthma Neg Hx    Eczema Neg Hx    Immunodeficiency Neg Hx    Urticaria Neg Hx      Review of Systems  Constitutional: Negative.  Negative for chills and fever.  HENT: Negative.  Negative for congestion and sore throat.   Respiratory: Negative.  Negative for cough and shortness of breath.   Cardiovascular: Negative.  Negative for chest pain and palpitations.  Gastrointestinal:  Negative for abdominal pain, diarrhea, nausea and vomiting.  Genitourinary: Negative.  Negative for dysuria and hematuria.  Skin: Negative.  Negative for rash.  Neurological: Negative.  Negative for dizziness and headaches.  All other systems reviewed and are negative.   Vitals:   02/04/23 1551  BP: 124/72  Pulse: 75  Temp: 97.7 F (36.5 C)  SpO2: 95%    Physical Exam Vitals reviewed.  Constitutional:      Appearance: Normal appearance.  HENT:      Head: Normocephalic.     Mouth/Throat:     Mouth: Mucous membranes are moist.     Pharynx: Oropharynx is clear.  Eyes:     Extraocular Movements: Extraocular movements intact.     Conjunctiva/sclera: Conjunctivae normal.     Pupils: Pupils are equal, round, and reactive to light.  Cardiovascular:     Rate and Rhythm: Normal rate and regular rhythm.     Pulses: Normal pulses.     Heart sounds: Normal heart sounds.  Pulmonary:     Effort:  Pulmonary effort is normal.     Breath sounds: Normal breath sounds.  Abdominal:     Palpations: Abdomen is soft.     Tenderness: There is no abdominal tenderness.  Musculoskeletal:     Cervical back: No tenderness.  Lymphadenopathy:     Cervical: No cervical adenopathy.  Skin:    General: Skin is warm and dry.     Capillary Refill: Capillary refill takes less than 2 seconds.  Neurological:     General: No focal deficit present.     Mental Status: She is alert and oriented to person, place, and time.  Psychiatric:        Mood and Affect: Mood normal.        Behavior: Behavior normal.      ASSESSMENT & PLAN: A total of 42 minutes was spent with the patient and counseling/coordination of care regarding preparing for this visit, review of most recent office visit notes, review of multiple chronic medical conditions and their management, review of all medications, review of most recent bloodwork results, review of health maintenance items, education on nutrition, prognosis, documentation, and need for follow up.  Problem List Items Addressed This Visit       Cardiovascular and Mediastinum   Essential hypertension - Primary   Well-controlled hypertension Continue diltiazem 240 mg daily Cardiovascular risks associated with hypertension discussed   BP Readings from Last 3 Encounters:  02/04/23 124/72  08/04/22 138/86  02/05/22 130/80         Relevant Orders   CBC with Differential/Platelet   Comprehensive metabolic panel   Hemoglobin  A1c   Lipid panel     Other   Prediabetes   Diet and nutrition discussed Stable condition      Relevant Orders   CBC with Differential/Platelet   Comprehensive metabolic panel   Hemoglobin A1c   Lipid panel   Dyslipidemia   Stable chronic condition Presently off medication Lipid profile done today      Relevant Orders   CBC with Differential/Platelet   Comprehensive metabolic panel   Hemoglobin A1c   Lipid panel   Patient Instructions  Hypertension, Adult High blood pressure (hypertension) is when the force of blood pumping through the arteries is too strong. The arteries are the blood vessels that carry blood from the heart throughout the body. Hypertension forces the heart to work harder to pump blood and may cause arteries to become narrow or stiff. Untreated or uncontrolled hypertension can lead to a heart attack, heart failure, a stroke, kidney disease, and other problems. A blood pressure reading consists of a higher number over a lower number. Ideally, your blood pressure should be below 120/80. The first ("top") number is called the systolic pressure. It is a measure of the pressure in your arteries as your heart beats. The second ("bottom") number is called the diastolic pressure. It is a measure of the pressure in your arteries as the heart relaxes. What are the causes? The exact cause of this condition is not known. There are some conditions that result in high blood pressure. What increases the risk? Certain factors may make you more likely to develop high blood pressure. Some of these risk factors are under your control, including: Smoking. Not getting enough exercise or physical activity. Being overweight. Having too much fat, sugar, calories, or salt (sodium) in your diet. Drinking too much alcohol. Other risk factors include: Having a personal history of heart disease, diabetes, high cholesterol, or kidney disease. Stress. Having a family  history of high  blood pressure and high cholesterol. Having obstructive sleep apnea. Age. The risk increases with age. What are the signs or symptoms? High blood pressure may not cause symptoms. Very high blood pressure (hypertensive crisis) may cause: Headache. Fast or irregular heartbeats (palpitations). Shortness of breath. Nosebleed. Nausea and vomiting. Vision changes. Severe chest pain, dizziness, and seizures. How is this diagnosed? This condition is diagnosed by measuring your blood pressure while you are seated, with your arm resting on a flat surface, your legs uncrossed, and your feet flat on the floor. The cuff of the blood pressure monitor will be placed directly against the skin of your upper arm at the level of your heart. Blood pressure should be measured at least twice using the same arm. Certain conditions can cause a difference in blood pressure between your right and left arms. If you have a high blood pressure reading during one visit or you have normal blood pressure with other risk factors, you may be asked to: Return on a different day to have your blood pressure checked again. Monitor your blood pressure at home for 1 week or longer. If you are diagnosed with hypertension, you may have other blood or imaging tests to help your health care provider understand your overall risk for other conditions. How is this treated? This condition is treated by making healthy lifestyle changes, such as eating healthy foods, exercising more, and reducing your alcohol intake. You may be referred for counseling on a healthy diet and physical activity. Your health care provider may prescribe medicine if lifestyle changes are not enough to get your blood pressure under control and if: Your systolic blood pressure is above 130. Your diastolic blood pressure is above 80. Your personal target blood pressure may vary depending on your medical conditions, your age, and other factors. Follow these  instructions at home: Eating and drinking  Eat a diet that is high in fiber and potassium, and low in sodium, added sugar, and fat. An example of this eating plan is called the DASH diet. DASH stands for Dietary Approaches to Stop Hypertension. To eat this way: Eat plenty of fresh fruits and vegetables. Try to fill one half of your plate at each meal with fruits and vegetables. Eat whole grains, such as whole-wheat pasta, Canoy rice, or whole-grain bread. Fill about one fourth of your plate with whole grains. Eat or drink low-fat dairy products, such as skim milk or low-fat yogurt. Avoid fatty cuts of meat, processed or cured meats, and poultry with skin. Fill about one fourth of your plate with lean proteins, such as fish, chicken without skin, beans, eggs, or tofu. Avoid pre-made and processed foods. These tend to be higher in sodium, added sugar, and fat. Reduce your daily sodium intake. Many people with hypertension should eat less than 1,500 mg of sodium a day. Do not drink alcohol if: Your health care provider tells you not to drink. You are pregnant, may be pregnant, or are planning to become pregnant. If you drink alcohol: Limit how much you have to: 0-1 drink a day for women. 0-2 drinks a day for men. Know how much alcohol is in your drink. In the [place] one drink equals one 12 oz bottle of beer (355 mL), one 5 oz glass of wine (148 mL), or one 1 oz glass of hard liquor (44 mL). Lifestyle  Work with your health care provider to maintain a healthy body weight or to lose weight. Ask what an ideal  weight is for you. Get at least 30 minutes of exercise that causes your heart to beat faster (aerobic exercise) most days of the week. Activities may include walking, swimming, or biking. Include exercise to strengthen your muscles (resistance exercise), such as Pilates or lifting weights, as part of your weekly exercise routine. Try to do these types of exercises for 30 minutes at least 3 days  a week. Do not use any products that contain nicotine or tobacco. These products include cigarettes, chewing tobacco, and vaping devices, such as e-cigarettes. If you need help quitting, ask your health care provider. Monitor your blood pressure at home as told by your health care provider. Keep all follow-up visits. This is important. Medicines Take over-the-counter and prescription medicines only as told by your health care provider. Follow directions carefully. Blood pressure medicines must be taken as prescribed. Do not skip doses of blood pressure medicine. Doing this puts you at risk for problems and can make the medicine less effective. Ask your health care provider about side effects or reactions to medicines that you should watch for. Contact a health care provider if you: Think you are having a reaction to a medicine you are taking. Have headaches that keep coming back (recurring). Feel dizzy. Have swelling in your ankles. Have trouble with your vision. Get help right away if you: Develop a severe headache or confusion. Have unusual weakness or numbness. Feel faint. Have severe pain in your chest or abdomen. Vomit repeatedly. Have trouble breathing. These symptoms may be an emergency. Get help right away. Call 911. Do not wait to see if the symptoms will go away. Do not drive yourself to the hospital. Summary Hypertension is when the force of blood pumping through your arteries is too strong. If this condition is not controlled, it may put you at risk for serious complications. Your personal target blood pressure may vary depending on your medical conditions, your age, and other factors. For most people, a normal blood pressure is less than 120/80. Hypertension is treated with lifestyle changes, medicines, or a combination of both. Lifestyle changes include losing weight, eating a healthy, low-sodium diet, exercising more, and limiting alcohol. This information is not intended  to replace advice given to you by your health care provider. Make sure you discuss any questions you have with your health care provider. Document Revised: 12/17/2020 Document Reviewed: 12/17/2020 Elsevier Patient Education  2024 Elsevier Inc.  .      Edwina Barth, MD [facility]

## 2023-02-04 NOTE — Assessment & Plan Note (Signed)
Stable chronic condition Presently off medication Lipid profile done today

## 2023-02-04 NOTE — Assessment & Plan Note (Signed)
Well-controlled hypertension Continue diltiazem 240 mg daily Cardiovascular risks associated with hypertension discussed   BP Readings from Last 3 Encounters:  02/04/23 124/72  08/04/22 138/86  02/05/22 130/80

## 2023-02-05 LAB — COMPREHENSIVE METABOLIC PANEL
ALT: 14 U/L (ref 0–35)
AST: 20 U/L (ref 0–37)
Albumin: 3.8 g/dL (ref 3.5–5.2)
Alkaline Phosphatase: 82 U/L (ref 39–117)
BUN: 21 mg/dL (ref 6–23)
CO2: 30 meq/L (ref 19–32)
Calcium: 9.1 mg/dL (ref 8.4–10.5)
Chloride: 101 meq/L (ref 96–112)
Creatinine, Ser: 0.78 mg/dL (ref 0.40–1.20)
GFR: 70.93 mL/min (ref >60.00)
Glucose, Bld: 87 mg/dL (ref 70–99)
Potassium: 4.1 meq/L (ref 3.5–5.1)
Sodium: 140 meq/L (ref 135–145)
Total Bilirubin: 0.6 mg/dL (ref 0.2–1.2)
Total Protein: 7.4 g/dL (ref 6.0–8.3)

## 2023-02-05 LAB — CBC WITH DIFFERENTIAL/PLATELET
Basophils Absolute: 0.1 10*3/uL (ref 0.0–0.1)
Basophils Relative: 1 % (ref 0.0–3.0)
Eosinophils Absolute: 0.1 10*3/uL (ref 0.0–0.7)
Eosinophils Relative: 2 % (ref 0.0–5.0)
HCT: 41.3 % (ref 36.0–46.0)
Hemoglobin: 13.7 g/dL (ref 12.0–15.0)
Lymphocytes Relative: 27.5 % (ref 12.0–46.0)
Lymphs Abs: 1.8 10*3/uL (ref 0.7–4.0)
MCHC: 33 g/dL (ref 30.0–36.0)
MCV: 92.9 fL (ref 78.0–100.0)
Monocytes Absolute: 0.5 10*3/uL (ref 0.1–1.0)
Monocytes Relative: 7.8 % (ref 3.0–12.0)
Neutro Abs: 4 10*3/uL (ref 1.4–7.7)
Neutrophils Relative %: 61.7 % (ref 43.0–77.0)
Platelets: 203 10*3/uL (ref 150.0–400.0)
RBC: 4.45 Mil/uL (ref 3.87–5.11)
RDW: 14.6 % (ref 11.5–15.5)
WBC: 6.5 10*3/uL (ref 4.0–10.5)

## 2023-02-05 LAB — HEMOGLOBIN A1C: Hgb A1c MFr Bld: 5.8 % (ref 4.6–6.5)

## 2023-02-05 LAB — LIPID PANEL
Cholesterol: 189 mg/dL (ref 0–200)
HDL: 59.4 mg/dL (ref >39.00)
LDL Cholesterol: 117 mg/dL — ABNORMAL HIGH (ref 0–99)
NonHDL: 129.15
Total CHOL/HDL Ratio: 3
Triglycerides: 61 mg/dL (ref 0.0–149.0)
VLDL: 12.2 mg/dL (ref 0.0–40.0)

## 2023-02-26 ENCOUNTER — Other Ambulatory Visit: Payer: Self-pay | Admitting: Emergency Medicine

## 2023-02-26 DIAGNOSIS — I10 Essential (primary) hypertension: Secondary | ICD-10-CM

## 2023-02-26 DIAGNOSIS — I471 Supraventricular tachycardia, unspecified: Secondary | ICD-10-CM

## 2023-03-17 ENCOUNTER — Other Ambulatory Visit: Payer: PPO

## 2023-03-20 ENCOUNTER — Ambulatory Visit
Admission: RE | Admit: 2023-03-20 | Discharge: 2023-03-20 | Disposition: A | Discharge status: Home / Self Care | Payer: PPO | Source: Ambulatory Visit | Attending: Emergency Medicine | Admitting: Emergency Medicine

## 2023-03-20 DIAGNOSIS — Z13820 Encounter for screening for osteoporosis: Secondary | ICD-10-CM

## 2023-03-22 ENCOUNTER — Encounter: Payer: Self-pay | Admitting: Emergency Medicine

## 2023-05-18 ENCOUNTER — Other Ambulatory Visit: Payer: Self-pay | Admitting: Emergency Medicine

## 2023-05-18 ENCOUNTER — Telehealth: Payer: Self-pay

## 2023-05-18 ENCOUNTER — Ambulatory Visit (INDEPENDENT_AMBULATORY_CARE_PROVIDER_SITE_OTHER)

## 2023-05-18 VITALS — Ht 59.0 in | Wt 175.0 lb

## 2023-05-18 DIAGNOSIS — Z0000 Encounter for general adult medical examination without abnormal findings: Secondary | ICD-10-CM

## 2023-05-18 DIAGNOSIS — Z1211 Encounter for screening for malignant neoplasm of colon: Secondary | ICD-10-CM

## 2023-05-18 DIAGNOSIS — Z1231 Encounter for screening mammogram for malignant neoplasm of breast: Secondary | ICD-10-CM

## 2023-05-18 NOTE — Telephone Encounter (Signed)
 Notified patient.  Patient verbalized understanding.

## 2023-05-18 NOTE — Patient Instructions (Addendum)
 [name] , Thank you for taking time to come for your Medicare Wellness Visit. I appreciate your ongoing commitment to your health goals. Please review the following plan we discussed and let me know if I can assist you in the future.   Referrals/Orders/Follow-Ups/Clinician Recommendations: It was nice to talking with you today.   Each day, aim for 6 glasses of water, plenty of protein in your diet and try to get up and walk/ stretch every hour for 5-10 minutes at a time.  You are due for a Shingles vaccine and a tetanus vaccine.  You are also due for a colonoscopy, which I sent PCP a message about you doing a Cologuard kit instead of colonoscopy.  You have an order for:   [x]   3D Mammogram    Please call for appointment:  [facility] [address] [place], [place] [address] [phone]   Make sure to wear two-piece clothing.  No lotions, powders, or deodorants the day of the appointment. Make sure to bring picture ID and insurance card.  Bring list of medications you are currently taking including any supplements.    This is a list of the screening recommended for you and due dates:  Health Maintenance  Topic Date Due   Zoster (Shingles) Vaccine (1 of 2) Never done   COVID-19 Vaccine (7 - Pfizer risk 2024-25 season) 05/28/2023   Medicare Annual Wellness Visit  05/17/2024   Pneumonia Vaccine  Completed   Flu Shot  Completed   DEXA scan (bone density measurement)  Completed   HPV Vaccine  Aged Out   DTaP/Tdap/Td vaccine  Discontinued   Hepatitis C Screening  Discontinued    Advanced directives: (Copy Requested) Please bring a copy of your health care power of attorney and living will to the office to be added to your chart at your convenience. You can mail to [facility] [address] [place], [place] [address] or email to [email or web address]  Next Medicare Annual Wellness Visit scheduled for next year: Yes

## 2023-05-18 NOTE — Progress Notes (Signed)
 Subjective:   Gina Guerrero is a 83 y.o. who presents for a Medicare Wellness preventive visit.  Visit Complete: Virtual I connected with  Gina Guerrero on 05/18/23 by a audio enabled telemedicine application and verified that I am speaking with the correct person using two identifiers.  Patient Location: Home  Provider Location: Home Office  I discussed the limitations of evaluation and management by telemedicine. The patient expressed understanding and agreed to proceed.  Vital Signs: Because this visit was a virtual/telehealth visit, some criteria may be missing or patient reported. Any vitals not documented were not able to be obtained and vitals that have been documented are patient reported.  VideoDeclined- This patient declined Librarian, academic. Therefore the visit was completed with audio only.  Persons Participating in Visit: Patient.  AWV Questionnaire: No: Patient Medicare AWV questionnaire was not completed prior to this visit.  Cardiac Risk Factors include: advanced age (>13men, >66 women);hypertension;dyslipidemia     Objective:    Today's Vitals   05/18/23 0856  Weight: 175 lb (79.4 kg)  Height: 4\' 11"  (1.499 m)   Body mass index is 35.35 kg/m.     05/18/2023    9:04 AM 07/14/2022    3:36 PM  Advanced Directives  Does Patient Have a Medical Advance Directive? Yes Yes  Type of Estate agent of [place];Living will Healthcare Power of [place];Living will  Copy of Healthcare Power of Attorney in Chart? No - copy requested No - copy requested    Current Medications (verified) Outpatient Encounter Medications as of 05/18/2023  Medication Sig   albuterol (VENTOLIN HFA) 108 (90 Base) MCG/ACT inhaler Inhale 2 puffs into the lungs every 4 (four) hours as needed for wheezing or shortness of breath (cough, shortness of breath or wheezing.).   aspirin EC 81 MG tablet Take 81 mg by mouth daily.   azelastine  (ASTELIN) 0.1 % nasal spray Place 2 sprays into both nostrils 2 (two) times daily as needed for rhinitis.   Cholecalciferol (VITAMIN D) 50 MCG (2000 UT) CAPS Take 1 capsule (2,000 Units total) by mouth daily.   cyclobenzaprine (FLEXERIL) 5 MG tablet TAKE 1 TABLET(5 MG) BY MOUTH THREE TIMES DAILY AS NEEDED FOR MUSCLE SPASMS   diltiazem (CARDIZEM CD) 240 MG 24 hr capsule TAKE 1 CAPSULE(240 MG) BY MOUTH DAILY   diphenhydrAMINE (BENADRYL) 25 MG tablet Take 25 mg by mouth every 6 (six) hours as needed.   Multiple Vitamins-Minerals (PRESERVISION AREDS 2 PO) Take 1 tablet by mouth 2 (two) times daily.   propranolol (INDERAL) 40 MG tablet TAKE 1 TABLET(40MG ) BY MOUTH THREE TIMES DAILY AS NEEDED(HEART RACING)   triamcinolone cream (KENALOG) 0.1 % Apply 1 application  topically 2 (two) times daily.   No facility-administered encounter medications on file as of 05/18/2023.    Allergies (verified) Patient has no known allergies.   History: Past Medical History:  Diagnosis Date   Asthma    Cataract    Hyperlipidemia    Hypertension    Osteopenia determined by x-ray 11/2018   low FRAX score   Tachycardia    Past Surgical History:  Procedure Laterality Date   ABDOMINAL HYSTERECTOMY     EYE SURGERY Bilateral 2013   TOE SURGERY     Family History  Problem Relation Age of Onset   Alopecia Father    Heart disease Sister    COPD Brother    Allergic rhinitis Neg Hx    Angioedema Neg Hx  Asthma Neg Hx    Eczema Neg Hx    Immunodeficiency Neg Hx    Urticaria Neg Hx    Social History   Socioeconomic History   Marital status: Married    Spouse name: Onalee Hua   Number of children: 0   Years of education: Not on file   Highest education level: 12th grade  Occupational History   Occupation: RETIRED  Tobacco Use   Smoking status: Never   Smokeless tobacco: Never  Vaping Use   Vaping status: Never Used  Substance and Sexual Activity   Alcohol use: No   Drug use: Never   Sexual activity:  Not on file  Other Topics Concern   Not on file  Social History Narrative   Lives with husband and is currently taking care of her niece who is dying of brain cancer.     No children.   Social Drivers of Health   Financial Resource Strain: Medium Risk (05/18/2023)   Overall Financial Resource Strain (CARDIA)    Difficulty of Paying Living Expenses: Somewhat hard  Food Insecurity: No Food Insecurity (05/18/2023)   Hunger Vital Sign    Worried About Running Out of Food in the Last Year: Never true    Ran Out of Food in the Last Year: Never true  Transportation Needs: No Transportation Needs (05/18/2023)   PRAPARE - Administrator, Civil Service (Medical): No    Lack of Transportation (Non-Medical): No  Physical Activity: Insufficiently Active (05/18/2023)   Exercise Vital Sign    Days of Exercise per Week: 7 days    Minutes of Exercise per Session: 10 min  Stress: No Stress Concern Present (05/18/2023)   [employer] of Occupational Health - Occupational Stress Questionnaire    Feeling of Stress : Not at all  Social Connections: Socially Integrated (05/18/2023)   Social Connection and Isolation Panel [NHANES]    Frequency of Communication with Friends and Family: More than three times a week    Frequency of Social Gatherings with Friends and Family: Once a week    Attends Religious Services: More than 4 times per year    Active Member of [employer] or Organizations: Yes    Attends Engineer, structural: More than 4 times per year    Marital Status: Married    Tobacco Counseling Counseling given: Not Answered    Clinical Intake:  Pre-visit preparation completed: Yes  Pain : No/denies pain     BMI - recorded: 35.35 Nutritional Status: BMI > 30  Obese Nutritional Risks: None Diabetes: No  Lab Results  Component Value Date   HGBA1C 5.8 02/04/2023   HGBA1C 5.9 02/05/2022   HGBA1C 5.8 (H) 04/23/2020     How often do you need to have someone help you  when you read instructions, pamphlets, or other written materials from your doctor or pharmacy?: 1 - Never  Interpreter Needed?: No  Information entered by :: Gordana Kewley, RMA   Activities of Daily Living     05/18/2023    8:58 AM 07/14/2022    3:45 PM  In your present state of health, do you have any difficulty performing the following activities:  Hearing? 0 0  Vision? 0 0  Difficulty concentrating or making decisions? 0 0  Walking or climbing stairs? 0 0  Dressing or bathing? 0 0  Doing errands, shopping? 0 0  Preparing Food and eating ? N N  Using the Toilet? N N  In the past six months,  have you accidently leaked urine? N N  Do you have problems with loss of bowel control? N N  Managing your Medications? N N  Managing your Finances? N N  Housekeeping or managing your Housekeeping? N N    Patient Care Team: Georgina Quint, MD as PCP - General (Internal Medicine) Yates Decamp, MD as Consulting Physician (Cardiology) Shon Millet, MD as Consulting Physician (Ophthalmology)  Indicate any recent Medical Services you may have received from other than [facility] providers in the past year (date may be approximate).     Assessment:   This is a routine wellness examination for [facility].  Hearing/Vision screen Hearing Screening - Comments:: Denies hearing difficulties   Vision Screening - Comments:: Wears eyeglasses   Goals Addressed               This Visit's Progress     My goal for 2024 is to lose weight.  My weight goal is to be around 145-150 pounds. (pt-stated)   On track     Still working on that/2025       Depression Screen     05/18/2023    9:10 AM 02/04/2023    3:54 PM 07/14/2022    3:39 PM 02/05/2022    1:02 PM 08/06/2021    1:03 PM 04/23/2020   11:41 AM 01/23/2020   11:10 AM  PHQ 2/9 Scores  PHQ - 2 Score 0 0 0 0 0 0 0  PHQ- 9 Score 0  0 0       Fall Risk     05/18/2023    9:04 AM 02/04/2023    3:54 PM 07/14/2022    3:37 PM 02/05/2022     1:02 PM 08/06/2021    1:03 PM  Fall Risk   Falls in the past year? 0 0 0 0 0  Number falls in past yr: 0 0 0 0   Injury with Fall? 0 0 0 0   Risk for fall due to : No Fall Risks No Fall Risks No Fall Risks No Fall Risks   Follow up Falls prevention discussed;Falls evaluation completed Falls evaluation completed Falls prevention discussed Falls evaluation completed     MEDICARE RISK AT HOME:  Medicare Risk at Home Any stairs in or around the home?: Yes If so, are there any without handrails?: Yes Home free of loose throw rugs in walkways, pet beds, electrical cords, etc?: Yes Adequate lighting in your home to reduce risk of falls?: Yes Life alert?: No Use of a cane, walker or w/c?: No Grab bars in the bathroom?: Yes Shower chair or bench in shower?: Yes Elevated toilet seat or a handicapped toilet?: Yes  TIMED UP AND GO:  Was the test performed?  No  Cognitive Function: 6CIT completed        05/18/2023    9:05 AM 07/14/2022    3:40 PM  6CIT Screen  What Year? 0 points 0 points  What month? 0 points 0 points  What time? 0 points 0 points  Count back from 20 0 points 0 points  Months in reverse 0 points 0 points  Repeat phrase 0 points 0 points  Total Score 0 points 0 points    Immunizations Immunization History  Administered Date(s) Administered   Fluad Quad(high Dose 65+) 12/15/2018, 02/05/2022, 12/12/2022   Influenza,inj,Quad PF,6+ Mos 03/14/2015, 03/26/2016   Influenza-Unspecified 12/27/2019   PFIZER(Purple Top)SARS-COV-2 Vaccination 03/15/2019, 04/05/2019, 11/28/2019, 06/20/2020, 11/25/2020   Pneumococcal Conjugate-13 09/13/2018   Pneumococcal Polysaccharide-23 01/23/2020  Respiratory Syncytial Virus Vaccine,Recomb Aduvanted(Arexvy) 11/25/2021   Unspecified SARS-COV-2 Vaccination 11/27/2022    Screening Tests Health Maintenance  Topic Date Due   Zoster Vaccines- Shingrix (1 of 2) Never done   COVID-19 Vaccine (7 - Pfizer risk 2024-25 season) 05/28/2023    Medicare Annual Wellness (AWV)  05/17/2024   Pneumonia Vaccine 85+ Years old  Completed   INFLUENZA VACCINE  Completed   DEXA SCAN  Completed   HPV VACCINES  Aged Out   DTaP/Tdap/Td  Discontinued   Hepatitis C Screening  Discontinued    Health Maintenance  Health Maintenance Due  Topic Date Due   Zoster Vaccines- Shingrix (1 of 2) Never done   Health Maintenance Items Addressed: Mammogram ordered, See Nurse Notes  Additional Screening:  Vision Screening: Recommended annual ophthalmology exams for early detection of glaucoma and other disorders of the eye.  Dental Screening: Recommended annual dental exams for proper oral hygiene  Community Resource Referral / Chronic Care Management: CRR required this visit?  No   CCM required this visit?  No     Plan:     I have personally reviewed and noted the following in the patient's chart:   Medical and social history Use of alcohol, tobacco or illicit drugs  Current medications and supplements including opioid prescriptions. Patient is not currently taking opioid prescriptions. Functional ability and status Nutritional status Physical activity Advanced directives List of other physicians Hospitalizations, surgeries, and ER visits in previous 12 months Vitals Screenings to include cognitive, depression, and falls Referrals and appointments  In addition, I have reviewed and discussed with patient certain preventive protocols, quality metrics, and best practice recommendations. A written personalized care plan for preventive services as well as general preventive health recommendations were provided to patient.     Gina Guerrero, CMA   05/18/2023   After Visit Summary: (MyChart) Due to this being a telephonic visit, the after visit summary with patients personalized plan was offered to patient via MyChart   Notes: Please refer to Routing Comments.

## 2023-05-18 NOTE — Telephone Encounter (Signed)
 Colon cancer screening no longer recommended after age of 12.

## 2023-05-18 NOTE — Telephone Encounter (Signed)
 Patient is due for a colonoscopy, however she wanted to know if she can do a Cologuard instead of the procedure.  I informed pt that I would send Dr. Alvy Bimler a message to see if he thinks it is ok for patient to get kit sent out.

## 2023-05-24 ENCOUNTER — Encounter: Payer: Self-pay | Admitting: Radiology

## 2023-05-24 ENCOUNTER — Telehealth: Payer: Self-pay | Admitting: Emergency Medicine

## 2023-05-24 NOTE — Telephone Encounter (Signed)
 Copied from CRM [id number]. Topic: General - Other >> May 21, 2023  4:47 PM Eunice Blase wrote: Reason for CRM: Pt called stated does not need Cologuard but yet received one via mail. Pt wants to know what to do with it? Please call pt at [phone]

## 2023-06-02 ENCOUNTER — Encounter: Payer: Self-pay | Admitting: Emergency Medicine

## 2023-06-02 LAB — COLOGUARD: COLOGUARD: NEGATIVE

## 2023-07-29 ENCOUNTER — Ambulatory Visit: Payer: Self-pay | Admitting: Emergency Medicine

## 2023-07-29 ENCOUNTER — Ambulatory Visit: Payer: PPO | Admitting: Emergency Medicine

## 2023-07-29 ENCOUNTER — Ambulatory Visit (INDEPENDENT_AMBULATORY_CARE_PROVIDER_SITE_OTHER)

## 2023-07-29 ENCOUNTER — Encounter: Payer: Self-pay | Admitting: Emergency Medicine

## 2023-07-29 VITALS — BP 102/76 | HR 128 | Temp 97.8°F | Ht 59.0 in | Wt 179.0 lb

## 2023-07-29 DIAGNOSIS — I10 Essential (primary) hypertension: Secondary | ICD-10-CM | POA: Diagnosis not present

## 2023-07-29 DIAGNOSIS — M79672 Pain in left foot: Secondary | ICD-10-CM

## 2023-07-29 DIAGNOSIS — R7303 Prediabetes: Secondary | ICD-10-CM | POA: Diagnosis not present

## 2023-07-29 DIAGNOSIS — E785 Hyperlipidemia, unspecified: Secondary | ICD-10-CM

## 2023-07-29 NOTE — Assessment & Plan Note (Addendum)
 BP Readings from Last 3 Encounters:  07/29/23 102/76  02/04/23 124/72  08/04/22 138/86  Well-controlled hypertension Continue diltiazem  240 mg daily Cardiovascular risks associated with hypertension discussed

## 2023-07-29 NOTE — Assessment & Plan Note (Signed)
 Left posterior ankle tender to palpation Recommend x-ray today.  Will review images when ready. Pain management discussed. Advised to take Tylenol and or Advil as needed for pain.

## 2023-07-29 NOTE — Progress Notes (Signed)
 Gina Guerrero 83 y.o.   Chief Complaint  Patient presents with   Medical Management of Chronic Issues    6 month follow up. Left ankle pain for the past week. Bilateral edema (L>R). Slight bruisng on the back of the ankle    HISTORY OF PRESENT ILLNESS: This is a 83 y.o. female here for 74-month follow-up of chronic medical conditions including hypertension, dyslipidemia, and prediabetes. Complaining of pain to the back of left ankle.  Unknown injury. No other complaints or medical concerns today.  HPI   Prior to Admission medications   Medication Sig Start Date End Date Taking? Authorizing Provider  albuterol  (VENTOLIN  HFA) 108 (90 Base) MCG/ACT inhaler Inhale 2 puffs into the lungs every 4 (four) hours as needed for wheezing or shortness of breath (cough, shortness of breath or wheezing.). 08/26/18  Yes Law, Alexandra M, PA-C  aspirin EC 81 MG tablet Take 81 mg by mouth daily.   Yes [provider]  azelastine  (ASTELIN ) 0.1 % nasal spray Place 2 sprays into both nostrils 2 (two) times daily as needed for rhinitis. 03/02/19  Yes Rochester Chuck, MD  Cholecalciferol (VITAMIN D ) 50 MCG (2000 UT) CAPS Take 1 capsule (2,000 Units total) by mouth daily. 02/05/22  Yes Krystena Reitter, Isidro Margo, MD  cyclobenzaprine  (FLEXERIL ) 5 MG tablet TAKE 1 TABLET(5 MG) BY MOUTH THREE TIMES DAILY AS NEEDED FOR MUSCLE SPASMS 12/17/22  Yes Elvira Hammersmith, MD  diltiazem  (CARDIZEM  CD) 240 MG 24 hr capsule TAKE 1 CAPSULE(240 MG) BY MOUTH DAILY 02/26/23  Yes Jailen Coward Jose, MD  diphenhydrAMINE (BENADRYL) 25 MG tablet Take 25 mg by mouth every 6 (six) hours as needed.   Yes [provider]  Multiple Vitamins-Minerals (PRESERVISION AREDS 2 PO) Take 1 tablet by mouth 2 (two) times daily.   Yes [provider]  propranolol  (INDERAL ) 40 MG tablet TAKE 1 TABLET(40MG ) BY MOUTH THREE TIMES DAILY AS NEEDED(HEART RACING) 05/15/22  Yes Knox Perl, MD  triamcinolone  cream (KENALOG ) 0.1  % Apply 1 application  topically 2 (two) times daily. 08/06/21  Yes Elvira Hammersmith, MD    No Known Allergies  Patient Active Problem List   Diagnosis Date Noted   Essential hypertension 08/06/2021   Dyslipidemia 08/06/2021   Prediabetes 04/23/2020   Mixed hyperlipidemia 04/23/2020   Osteopenia determined by x-ray 11/2018   Perennial allergic rhinitis 08/30/2018   Chronic cough 05/13/2016    Past Medical History:  Diagnosis Date   Asthma    Cataract    Hyperlipidemia    Hypertension    Osteopenia determined by x-ray 11/2018   low FRAX score   Tachycardia     Past Surgical History:  Procedure Laterality Date   ABDOMINAL HYSTERECTOMY     EYE SURGERY Bilateral 2013   TOE SURGERY      Social History   Socioeconomic History   Marital status: Married    Spouse name: Myrtie Atkinson   Number of children: 0   Years of education: Not on file   Highest education level: 12th grade  Occupational History   Occupation: RETIRED  Tobacco Use   Smoking status: Never   Smokeless tobacco: Never  Vaping Use   Vaping status: Never Used  Substance and Sexual Activity   Alcohol  use: No   Drug use: Never   Sexual activity: Not on file  Other Topics Concern   Not on file  Social History Narrative   Lives with husband and is currently taking care of her niece  who is dying of brain cancer.     No children.   Social Drivers of Corporate investment banker Strain: Low Risk  (07/25/2023)   Overall Financial Resource Strain (CARDIA)    Difficulty of Paying Living Expenses: Not hard at all  Recent Concern: Financial Resource Strain - Medium Risk (05/18/2023)   Overall Financial Resource Strain (CARDIA)    Difficulty of Paying Living Expenses: Somewhat hard  Food Insecurity: No Food Insecurity (07/25/2023)   Hunger Vital Sign    Worried About Running Out of Food in the Last Year: Never true    Ran Out of Food in the Last Year: Never true  Transportation Needs: No Transportation Needs  (07/25/2023)   PRAPARE - Administrator, Civil Service (Medical): No    Lack of Transportation (Non-Medical): No  Physical Activity: Sufficiently Active (07/25/2023)   Exercise Vital Sign    Days of Exercise per Week: 3 days    Minutes of Exercise per Session: 100 min  Recent Concern: Physical Activity - Insufficiently Active (05/18/2023)   Exercise Vital Sign    Days of Exercise per Week: 7 days    Minutes of Exercise per Session: 10 min  Stress: No Stress Concern Present (07/25/2023)   [employer] of Occupational Health - Occupational Stress Questionnaire    Feeling of Stress : Not at all  Social Connections: Socially Integrated (07/25/2023)   Social Connection and Isolation Panel [NHANES]    Frequency of Communication with Friends and Family: More than three times a week    Frequency of Social Gatherings with Friends and Family: More than three times a week    Attends Religious Services: 1 to 4 times per year    Active Member of [employer] or Organizations: Yes    Attends Engineer, structural: More than 4 times per year    Marital Status: Married  Catering manager Violence: Not At Risk (05/18/2023)   Humiliation, Afraid, Rape, and Kick questionnaire    Fear of Current or Ex-Partner: No    Emotionally Abused: No    Physically Abused: No    Sexually Abused: No    Family History  Problem Relation Age of Onset   Alopecia Father    Heart disease Sister    COPD Brother    Allergic rhinitis Neg Hx    Angioedema Neg Hx    Asthma Neg Hx    Eczema Neg Hx    Immunodeficiency Neg Hx    Urticaria Neg Hx      Review of Systems  Constitutional: Negative.  Negative for chills and fever.  HENT: Negative.  Negative for congestion and sore throat.   Respiratory: Negative.  Negative for cough and shortness of breath.   Cardiovascular: Negative.  Negative for chest pain and palpitations.  Gastrointestinal:  Negative for abdominal pain, diarrhea, nausea and vomiting.   Genitourinary: Negative.  Negative for dysuria and hematuria.  Skin: Negative.  Negative for rash.  Neurological:  Negative for dizziness and headaches.  All other systems reviewed and are negative.   Today's Vitals   07/29/23 1450  BP: 102/76  Pulse: (!) 128  Temp: 97.8 F (36.6 C)  SpO2: 98%  Weight: 179 lb (81.2 kg)  Height: 4\' 11"  (1.499 m)   Body mass index is 36.15 kg/m.   Physical Exam Vitals reviewed.  Constitutional:      Appearance: Normal appearance.  HENT:     Head: Normocephalic.     Mouth/Throat:  Mouth: Mucous membranes are moist.     Pharynx: Oropharynx is clear.  Eyes:     Extraocular Movements: Extraocular movements intact.     Pupils: Pupils are equal, round, and reactive to light.  Cardiovascular:     Rate and Rhythm: Normal rate and regular rhythm.     Pulses: Normal pulses.     Heart sounds: Normal heart sounds.  Pulmonary:     Effort: Pulmonary effort is normal.     Breath sounds: Normal breath sounds.  Musculoskeletal:     Cervical back: No tenderness.     Comments: Mild edema to lower extremities Some tenderness to left posterior ankle area  Lymphadenopathy:     Cervical: No cervical adenopathy.  Skin:    General: Skin is warm and dry.     Capillary Refill: Capillary refill takes less than 2 seconds.  Neurological:     General: No focal deficit present.     Mental Status: She is alert and oriented to person, place, and time.  Psychiatric:        Mood and Affect: Mood normal.        Behavior: Behavior normal.      ASSESSMENT & PLAN: A total of 43 minutes was spent with the patient and counseling/coordination of care regarding preparing for this visit, review of most recent office visit notes, review of multiple chronic medical conditions and their management, review of all medications, review of most recent bloodwork results, review of health maintenance items, education on nutrition, prognosis, documentation, and need for follow  up.   Problem List Items Addressed This Visit       Cardiovascular and Mediastinum   Essential hypertension - Primary   BP Readings from Last 3 Encounters:  07/29/23 102/76  02/04/23 124/72  08/04/22 138/86  Well-controlled hypertension Continue diltiazem  240 mg daily Cardiovascular risks associated with hypertension discussed          Other   Prediabetes   Diet and nutrition discussed Stable condition      Dyslipidemia   Stable chronic condition Presently off medication       Left foot pain   Left posterior ankle tender to palpation Recommend x-ray today.  Will review images when ready. Pain management discussed. Advised to take Tylenol and or Advil as needed for pain.      Relevant Orders   DG Foot Complete Left   Patient Instructions  Health Maintenance After Age 7 After age 18, you are at a higher risk for certain long-term diseases and infections as well as injuries from falls. Falls are a major cause of broken bones and head injuries in people who are older than age 83. Getting regular preventive care can help to keep you healthy and well. Preventive care includes getting regular testing and making lifestyle changes as recommended by your health care provider. Talk with your health care provider about: Which screenings and tests you should have. A screening is a test that checks for a disease when you have no symptoms. A diet and exercise plan that is right for you. What should I know about screenings and tests to prevent falls? Screening and testing are the best ways to find a health problem early. Early diagnosis and treatment give you the best chance of managing medical conditions that are common after age 104. Certain conditions and lifestyle choices may make you more likely to have a fall. Your health care provider may recommend: Regular vision checks. Poor vision and conditions such as  cataracts can make you more likely to have a fall. If you wear glasses,  make sure to get your prescription updated if your vision changes. Medicine review. Work with your health care provider to regularly review all of the medicines you are taking, including over-the-counter medicines. Ask your health care provider about any side effects that may make you more likely to have a fall. Tell your health care provider if any medicines that you take make you feel dizzy or sleepy. Strength and balance checks. Your health care provider may recommend certain tests to check your strength and balance while standing, walking, or changing positions. Foot health exam. Foot pain and numbness, as well as not wearing proper footwear, can make you more likely to have a fall. Screenings, including: Osteoporosis screening. Osteoporosis is a condition that causes the bones to get weaker and break more easily. Blood pressure screening. Blood pressure changes and medicines to control blood pressure can make you feel dizzy. Depression screening. You may be more likely to have a fall if you have a fear of falling, feel depressed, or feel unable to do activities that you used to do. Alcohol  use screening. Using too much alcohol  can affect your balance and may make you more likely to have a fall. Follow these instructions at home: Lifestyle Do not drink alcohol  if: Your health care provider tells you not to drink. If you drink alcohol : Limit how much you have to: 0-1 drink a day for women. 0-2 drinks a day for men. Know how much alcohol  is in your drink. In the [place] one drink equals one 12 oz bottle of beer (355 mL), one 5 oz glass of wine (148 mL), or one 1 oz glass of hard liquor (44 mL). Do not use any products that contain nicotine or tobacco. These products include cigarettes, chewing tobacco, and vaping devices, such as e-cigarettes. If you need help quitting, ask your health care provider. Activity  Follow a regular exercise program to stay fit. This will help you maintain your  balance. Ask your health care provider what types of exercise are appropriate for you. If you need a cane or walker, use it as recommended by your health care provider. Wear supportive shoes that have nonskid soles. Safety  Remove any tripping hazards, such as rugs, cords, and clutter. Install safety equipment such as grab bars in bathrooms and safety rails on stairs. Keep rooms and walkways well-lit. General instructions Talk with your health care provider about your risks for falling. Tell your health care provider if: You fall. Be sure to tell your health care provider about all falls, even ones that seem minor. You feel dizzy, tiredness (fatigue), or off-balance. Take over-the-counter and prescription medicines only as told by your health care provider. These include supplements. Eat a healthy diet and maintain a healthy weight. A healthy diet includes low-fat dairy products, low-fat (lean) meats, and fiber from whole grains, beans, and lots of fruits and vegetables. Stay current with your vaccines. Schedule regular health, dental, and eye exams. Summary Having a healthy lifestyle and getting preventive care can help to protect your health and wellness after age 68. Screening and testing are the best way to find a health problem early and help you avoid having a fall. Early diagnosis and treatment give you the best chance for managing medical conditions that are more common for people who are older than age 34. Falls are a major cause of broken bones and head injuries in people who are  older than age 53. Take precautions to prevent a fall at home. Work with your health care provider to learn what changes you can make to improve your health and wellness and to prevent falls. This information is not intended to replace advice given to you by your health care provider. Make sure you discuss any questions you have with your health care provider. Document Revised: 07/01/2020 Document Reviewed:  07/01/2020 Elsevier Patient Education  2024 Elsevier Inc.     Maryagnes Small, MD [facility]

## 2023-07-29 NOTE — Assessment & Plan Note (Signed)
Diet and nutrition discussed Stable condition

## 2023-07-29 NOTE — Assessment & Plan Note (Signed)
Stable chronic condition Presently off medication

## 2023-07-29 NOTE — Patient Instructions (Signed)
 Health Maintenance After Age 83 After age 4, you are at a higher risk for certain long-term diseases and infections as well as injuries from falls. Falls are a major cause of broken bones and head injuries in people who are older than age 47. Getting regular preventive care can help to keep you healthy and well. Preventive care includes getting regular testing and making lifestyle changes as recommended by your health care provider. Talk with your health care provider about: Which screenings and tests you should have. A screening is a test that checks for a disease when you have no symptoms. A diet and exercise plan that is right for you. What should I know about screenings and tests to prevent falls? Screening and testing are the best ways to find a health problem early. Early diagnosis and treatment give you the best chance of managing medical conditions that are common after age 37. Certain conditions and lifestyle choices may make you more likely to have a fall. Your health care provider may recommend: Regular vision checks. Poor vision and conditions such as cataracts can make you more likely to have a fall. If you wear glasses, make sure to get your prescription updated if your vision changes. Medicine review. Work with your health care provider to regularly review all of the medicines you are taking, including over-the-counter medicines. Ask your health care provider about any side effects that may make you more likely to have a fall. Tell your health care provider if any medicines that you take make you feel dizzy or sleepy. Strength and balance checks. Your health care provider may recommend certain tests to check your strength and balance while standing, walking, or changing positions. Foot health exam. Foot pain and numbness, as well as not wearing proper footwear, can make you more likely to have a fall. Screenings, including: Osteoporosis screening. Osteoporosis is a condition that causes  the bones to get weaker and break more easily. Blood pressure screening. Blood pressure changes and medicines to control blood pressure can make you feel dizzy. Depression screening. You may be more likely to have a fall if you have a fear of falling, feel depressed, or feel unable to do activities that you used to do. Alcohol use screening. Using too much alcohol can affect your balance and may make you more likely to have a fall. Follow these instructions at home: Lifestyle Do not drink alcohol if: Your health care provider tells you not to drink. If you drink alcohol: Limit how much you have to: 0-1 drink a day for women. 0-2 drinks a day for men. Know how much alcohol is in your drink. In the [place] one drink equals one 12 oz bottle of beer (355 mL), one 5 oz glass of wine (148 mL), or one 1 oz glass of hard liquor (44 mL). Do not use any products that contain nicotine or tobacco. These products include cigarettes, chewing tobacco, and vaping devices, such as e-cigarettes. If you need help quitting, ask your health care provider. Activity  Follow a regular exercise program to stay fit. This will help you maintain your balance. Ask your health care provider what types of exercise are appropriate for you. If you need a cane or walker, use it as recommended by your health care provider. Wear supportive shoes that have nonskid soles. Safety  Remove any tripping hazards, such as rugs, cords, and clutter. Install safety equipment such as grab bars in bathrooms and safety rails on stairs. Keep rooms and walkways  well-lit. General instructions Talk with your health care provider about your risks for falling. Tell your health care provider if: You fall. Be sure to tell your health care provider about all falls, even ones that seem minor. You feel dizzy, tiredness (fatigue), or off-balance. Take over-the-counter and prescription medicines only as told by your health care provider. These include  supplements. Eat a healthy diet and maintain a healthy weight. A healthy diet includes low-fat dairy products, low-fat (lean) meats, and fiber from whole grains, beans, and lots of fruits and vegetables. Stay current with your vaccines. Schedule regular health, dental, and eye exams. Summary Having a healthy lifestyle and getting preventive care can help to protect your health and wellness after age 11. Screening and testing are the best way to find a health problem early and help you avoid having a fall. Early diagnosis and treatment give you the best chance for managing medical conditions that are more common for people who are older than age 28. Falls are a major cause of broken bones and head injuries in people who are older than age 48. Take precautions to prevent a fall at home. Work with your health care provider to learn what changes you can make to improve your health and wellness and to prevent falls. This information is not intended to replace advice given to you by your health care provider. Make sure you discuss any questions you have with your health care provider. Document Revised: 07/01/2020 Document Reviewed: 07/01/2020 Elsevier Patient Education  2024 [employer].

## 2023-08-16 ENCOUNTER — Encounter: Payer: Self-pay | Admitting: Cardiology

## 2023-08-16 ENCOUNTER — Ambulatory Visit: Attending: Cardiology | Admitting: Cardiology

## 2023-08-16 VITALS — BP 120/80 | HR 77 | Resp 16 | Ht 59.0 in | Wt 178.6 lb

## 2023-08-16 DIAGNOSIS — I471 Supraventricular tachycardia, unspecified: Secondary | ICD-10-CM | POA: Diagnosis not present

## 2023-08-16 DIAGNOSIS — I10 Essential (primary) hypertension: Secondary | ICD-10-CM

## 2023-08-16 NOTE — Patient Instructions (Signed)
 Medication Instructions:  Your physician recommends that you continue on your current medications as directed. Please refer to the Current Medication list given to you today.  *If you need a refill on your cardiac medications before your next appointment, please call your pharmacy*  Lab Work: none If you have labs (blood work) drawn today and your tests are completely normal, you will receive your results only by: MyChart Message (if you have MyChart) OR A paper copy in the mail If you have any lab test that is abnormal or we need to change your treatment, we will call you to review the results.  Testing/Procedures: none  Follow-Up: At [facility], you and your health needs are our priority.  As part of our continuing mission to provide you with exceptional heart care, our providers are all part of one team.  This team includes your primary Cardiologist (physician) and Advanced Practice Providers or APPs (Physician Assistants and Nurse Practitioners) who all work together to provide you with the care you need, when you need it.  Your next appointment:   As needed  Provider:   Dr Ladona   We recommend signing up for the patient portal called MyChart.  Sign up information is provided on this After Visit Summary.  MyChart is used to connect with patients for Virtual Visits (Telemedicine).  Patients are able to view lab/test results, encounter notes, upcoming appointments, etc.  Non-urgent messages can be sent to your provider as well.   To learn more about what you can do with MyChart, go to [email or web address].   Other Instructions You have been referred to see an electrophysiologist in our office.  Office will call you to schedule this appointment

## 2023-08-16 NOTE — Progress Notes (Unsigned)
 Cardiology Office Note:  .   Date:  08/17/2023  ID:  Gina Guerrero, DOB 1940-07-04, MRN [id number] PCP: Purcell Emil Schanz, MD  [facility] Providers Cardiologist:  Gordy Bergamo, MD   History of Present Illness: .   Gina Guerrero is a 83 y.o.  AAF patient with  medical history is significant for asthma, Hyperlipidemia, morbid obesity, venous insufficiency and varicose veins of the lower extremity, PSVT with a heart rate of 193 beats minute when seen in the emergency room on 08/26/2018 and spontaneously converted to sinus rhythm after IV diltiazem .     The patient presents for annual visit of PSVT.  She has occasional episodes of palpitations with relief with Valsalva maneuver.  Otherwise states that she is doing well and remains asymptomatic.   She has had a normal Lexiscan  nuclear stress test in 2021 along with a normal echocardiogram with very mild aortic and tricuspid regurgitation.  Cardiac event monitor in 2020 had revealed SVT, longest 9 hours, symptomatic.  Discussed the use of AI scribe software for clinical note transcription with the patient, who gave verbal consent to proceed.  History of Present Illness Gina Guerrero is an 83 year old female with supraventricular tachycardia who presents for follow-up regarding her heart condition and Achilles tendonitis.  She experiences a significant reduction in episodes of rapid heartbeat, with a 90% improvement since starting diltiazem  CD 240 mg daily. Occasional episodes occur, which she manages with the Valsalva maneuver. She rarely requires additional medication.  Labs   Lab Results  Component Value Date   CHOL 189 02/04/2023   HDL 59.40 02/04/2023   LDLCALC 117 (H) 02/04/2023   TRIG 61.0 02/04/2023   CHOLHDL 3 02/04/2023   Lab Results  Component Value Date   NA 140 02/04/2023   K 4.1 02/04/2023   CO2 30 02/04/2023   GLUCOSE 87 02/04/2023   BUN 21 02/04/2023   CREATININE 0.78 02/04/2023   CALCIUM  9.1  02/04/2023   GFR 70.93 02/04/2023   EGFR 76 04/23/2020   GFRNONAA 80 01/23/2020      Latest Ref Rng & Units 02/04/2023    4:27 PM 02/05/2022    1:26 PM 04/23/2020   12:00 PM  BMP  Glucose 70 - 99 mg/dL 87  97  84   BUN 6 - 23 mg/dL 21  17  18    Creatinine 0.40 - 1.20 mg/dL 9.21  9.29  9.20   BUN/Creat Ratio 12 - 28   23   Sodium 135 - 145 mEq/L 140  142  147   Potassium 3.5 - 5.1 mEq/L 4.1  3.9  4.6   Chloride 96 - 112 mEq/L 101  104  107   CO2 19 - 32 mEq/L 30  33  24   Calcium  8.4 - 10.5 mg/dL 9.1  9.3  9.4       Latest Ref Rng & Units 02/04/2023    4:27 PM 02/05/2022    1:26 PM 08/26/2018   10:24 AM  CBC  WBC 4.0 - 10.5 K/uL 6.5  6.6  10.1   Hemoglobin 12.0 - 15.0 g/dL 86.2  86.8  85.8   Hematocrit 36.0 - 46.0 % 41.3  40.0  42.5   Platelets 150.0 - 400.0 K/uL 203.0  210.0  154    Lab Results  Component Value Date   HGBA1C 5.8 02/04/2023    Lab Results  Component Value Date   TSH 1.700 06/15/2019     ROS  Review  of Systems  Cardiovascular:  Positive for palpitations. Negative for chest pain, dyspnea on exertion and leg swelling.    Physical Exam:   VS:  BP 120/80 (BP Location: Left Arm, Patient Position: Sitting, Cuff Size: Large)   Pulse 77   Resp 16   Ht 4' 11 (1.499 m)   Wt 178 lb 9.6 oz (81 kg)   SpO2 97%   BMI 36.07 kg/m    Wt Readings from Last 3 Encounters:  08/16/23 178 lb 9.6 oz (81 kg)  07/29/23 179 lb (81.2 kg)  05/18/23 175 lb (79.4 kg)    Physical Exam Constitutional:      Appearance: She is obese.  Neck:     Vascular: No carotid bruit or JVD.   Cardiovascular:     Rate and Rhythm: Normal rate and regular rhythm.     Pulses: Intact distal pulses.     Heart sounds: Normal heart sounds. No murmur heard.    No gallop.  Pulmonary:     Effort: Pulmonary effort is normal.     Breath sounds: Normal breath sounds.  Abdominal:     General: Bowel sounds are normal.     Palpations: Abdomen is soft.   Musculoskeletal:     Right lower leg:  No edema.     Left lower leg: No edema.    Studies Reviewed: SABRA    Event Monitor for 30 days Start date 11/16/2018: The patient's monitoring period was 11/16/2018 - 12/15/2018. Baseline sample showed Sinus Rhythm w/Artifact with a heart rate of 71.7 bpm. There were 4 critical and 9 stable events that occurred.  The triggered events included occasional PVC. One symptom corelated with SVT @ 160/min.  There were 4 sustained events and longest lasted 9 hours and 10 min and shortest 4 hours and 23 minutes.  Symptoms included fatigue and no symptom or accidental push.     EKG:    EKG Interpretation Date/Time:  Monday August 16 2023 15:52:40 EDT Ventricular Rate:  74 PR Interval:  154 QRS Duration:  108 QT Interval:  392 QTC Calculation: 435 R Axis:   -70  Text Interpretation: EKG 08/16/2023: Normal sinus rhythm at rate of 74 bpm, left anterior fascicular block.  Poor R progression probably related to LAFB however cannot exclude anterior infarct old.  No evidence ischemia, normal QT interval.  Compared to 11/24/2021, no change. Confirmed by Keziah Avis, Jagadeesh ([id number]) on 08/16/2023 3:58:12 PM    Medications ordered    No orders of the defined types were placed in this encounter.    ASSESSMENT AND PLAN: .      ICD-10-CM   1. PSVT (paroxysmal supraventricular tachycardia) (HCC)  I47.10 EKG 12-Lead    Ambulatory referral to Cardiac Electrophysiology    2. Primary hypertension  I10       Assessment and Plan Assessment & Plan Paroxysmal Supraventricular Tachycardia (PSVT) Intermittent episodes of rapid heartbeat, significantly improved with current diltiazem  CD 240 mg daily regimen. Rarely requires additional medication. Discussed electrophysiology (EP) study and potential ablation procedure, involving catheterization from the groin to the heart to identify and ablate the arrhythmogenic pathway. Success rate is approximately 90%, with a small risk of not finding the pathway. Typically  outpatient unless complications arise. She expressed interest in EP consultation for further evaluation. - Refer to EP specialist for evaluation and opinion on potential ablation procedure. - Continue diltiazem  CD 240 mg daily.  Achilles Tendonitis Chronic heel pain attributed to Achilles tendonitis, diagnosed by primary care physician. X-rays  confirm tendonitis rather than trauma. Pain exacerbated by activity and worsens at night. Current management includes Tylenol and Advil as needed for pain. She remains active, potentially prolonging symptoms. - Send message to primary care physician, Dr. Purcell, to discuss potential referral to a podiatrist. - Continue Tylenol and Advil as needed for pain management.   Signed,  Gordy Bergamo, MD, [facility] 08/17/2023, 8:52 PM [facility] [address] [place], [place] [address] Phone: [phone]. Fax:  [phone]

## 2023-08-18 ENCOUNTER — Other Ambulatory Visit: Payer: Self-pay | Admitting: Emergency Medicine

## 2023-08-18 DIAGNOSIS — M7662 Achilles tendinitis, left leg: Secondary | ICD-10-CM

## 2023-08-23 NOTE — Progress Notes (Unsigned)
  Electrophysiology Office Note:   Date:  08/25/2023  ID:  Gina Guerrero, DOB December 11, 1940, MRN [id number]  Primary Cardiologist: Gordy Bergamo, MD Electrophysiologist: Fonda Kitty, MD      History of Present Illness:   Gina Guerrero is a 83 y.o. female with h/o asthma, hyperlipidemia, venous insufficiency and varicose veins of the lower extremity, SVT who is being seen today for evaluation of her SVT at the request of Dr. Bergamo.  Discussed the use of AI scribe software for clinical note transcription with the patient, who gave verbal consent to proceed.  History of Present Illness Gina Guerrero is an 83 year old female who presents with intermittent tachycardia. She was referred by Dr. Bergamo for evaluation of her SVT. She experiences episodes of tachycardia intermittently, with the most significant episode occurring in 2020 when her heart rate reached 190 beats per minute. These episodes have decreased in frequency, now occurring approximately once every two to three weeks, typically during sleep or upon waking. During these episodes, she feels tired but has no sensation of syncope or presyncope. The episodes generally last for a couple of minutes and have not been severe enough to require emergency room visits. She is currently taking diltiazem , which she believes helps in reducing the severity of the episodes. Otherwise doing relatively well with no new or acute complaints.  She is actively involved in caregiving, taking care of her sick husband who has pneumonia and a UTI, and previously cared for her sick nephew.   Review of systems complete and found to be negative unless listed in HPI.   EP Information / Studies Reviewed:    EKG is not ordered today. EKG from 08/16/23 reviewed which showed sinus rhythm with left axis.     EKG 08/26/2018:    PCV Echocardiogram 12/01/2019:  Normal LV systolic function with visual EF 50-55%. Left ventricle cavity  is normal in size. Normal global  wall motion. Normal diastolic filling  pattern, normal LAP.  Mild (Grade I) aortic regurgitation.  Mild tricuspid regurgitation.  Compared to prior study dated 10/14/2019: No significant change noted.    Physical Exam:   VS:  BP 110/62   Pulse 69   Ht 4' 11 (1.499 m)   Wt 179 lb (81.2 kg)   SpO2 98%   BMI 36.15 kg/m    Wt Readings from Last 3 Encounters:  08/24/23 179 lb (81.2 kg)  08/16/23 178 lb 9.6 oz (81 kg)  07/29/23 179 lb (81.2 kg)     GEN: Well nourished, well developed in no acute distress NECK: No JVD CARDIAC: Normal rate, regular rhythm RESPIRATORY:  Clear to auscultation without rales, wheezing or rhonchi  ABDOMEN: Soft, non-distended EXTREMITIES:  No edema; No deformity   ASSESSMENT AND PLAN:    #. SVT: History of symptomatic and sustained episodes, but significantly improved since starting diltiazem . Unclear what current burden is.  #. Palpitations:  -Continue diltiazem  240mg  daily. Propranolol  as needed for sustained episodes.  -We will order a 2 week Zio monitor to assess frequency and symptom correlation with heart rhythm. -She will return after monitor is complete to discuss need for more aggressive therapy such as AADs or EP study +/- ablation.   Follow up with Dr. Kitty in 10 weeks.   Signed, Fonda Kitty, MD

## 2023-08-24 ENCOUNTER — Encounter: Payer: Self-pay | Admitting: Cardiology

## 2023-08-24 ENCOUNTER — Ambulatory Visit: Attending: Cardiology | Admitting: Cardiology

## 2023-08-24 ENCOUNTER — Ambulatory Visit: Attending: Cardiology

## 2023-08-24 VITALS — BP 110/62 | HR 69 | Ht 59.0 in | Wt 179.0 lb

## 2023-08-24 DIAGNOSIS — I471 Supraventricular tachycardia, unspecified: Secondary | ICD-10-CM

## 2023-08-24 DIAGNOSIS — R002 Palpitations: Secondary | ICD-10-CM

## 2023-08-24 NOTE — Progress Notes (Unsigned)
 Enrolled patient for a 14 day Zio XT  monitor to be mailed to patients home

## 2023-08-24 NOTE — Patient Instructions (Signed)
 Medication Instructions:  Your physician recommends that you continue on your current medications as directed. Please refer to the Current Medication list given to you today.  *If you need a refill on your cardiac medications before your next appointment, please call your pharmacy*  Testing/Procedures: Event Monitor  Your physician has recommended that you wear an event monitor. Event monitors are medical devices that record the heart's electrical activity. Doctors most often us  these monitors to diagnose arrhythmias. Arrhythmias are problems with the speed or rhythm of the heartbeat. The monitor is a small, portable device. You can wear one while you do your normal daily activities. This is usually used to diagnose what is causing palpitations/syncope (passing out).  Follow-Up: At [facility], you and your health needs are our priority.  As part of our continuing mission to provide you with exceptional heart care, our providers are all part of one team.  This team includes your primary Cardiologist (physician) and Advanced Practice Providers or APPs (Physician Assistants and Nurse Practitioners) who all work together to provide you with the care you need, when you need it.  Your next appointment:   8 weeks  Provider:   Fonda Kitty, MD      Other Instructions:  Gina Guerrero- Long Term Monitor Instructions  Your physician has requested you wear a ZIO patch monitor for 14 days.  This is a single patch monitor. [employer] supplies one patch monitor per enrollment. Additional stickers are not available. Please do not apply patch if you will be having a Nuclear Stress Test,  Echocardiogram, Cardiac CT, MRI, or Chest Xray during the period you would be wearing the  monitor. The patch cannot be worn during these tests. You cannot remove and re-apply the  ZIO XT patch monitor.  Your ZIO patch monitor will be mailed 3 day USPS to your address on file. It may take 3-5 days  to receive your  monitor after you have been enrolled.  Once you have received your monitor, please review the enclosed instructions. Your monitor  has already been registered assigning a specific monitor serial # to you.  Billing and Patient Assistance Program Information  We have supplied [employer] with any of your insurance information on file for billing purposes. [employer] offers a sliding scale Patient Assistance Program for patients that do not have  insurance, or whose insurance does not completely cover the cost of the ZIO monitor.  You must apply for the Patient Assistance Program to qualify for this discounted rate.  To apply, please call [employer] at [phone], select option 4, select option 2, ask to apply for  Patient Assistance Program. Gina Guerrero will ask your household income, and how many people  are in your household. They will quote your out-of-pocket cost based on that information.  [employer] will also be able to set up a 6-month, interest-free payment plan if needed.  Applying the monitor   Shave hair from upper left chest.  Hold abrader disc by orange tab. Rub abrader in 40 strokes over the upper left chest as  indicated in your monitor instructions.  Clean area with 4 enclosed alcohol  pads. Let dry.  Apply patch as indicated in monitor instructions. Patch will be placed under collarbone on left  side of chest with arrow pointing upward.  Rub patch adhesive wings for 2 minutes. Remove white label marked 1. Remove the white  label marked 2. Rub patch adhesive wings for 2 additional minutes.  While looking in a mirror, press and release  button in center of patch. A small green light will  flash 3-4 times. This will be your only indicator that the monitor has been turned on.  Do not shower for the first 24 hours. You may shower after the first 24 hours.  Press the button if you feel a symptom. You will hear a small click. Record Date, Time and  Symptom in the Patient Logbook.  When you  are ready to remove the patch, follow instructions on the last 2 pages of Patient  Logbook. Stick patch monitor onto the last page of Patient Logbook.  Place Patient Logbook in the blue and white box. Use locking tab on box and tape box closed  securely. The blue and white box has prepaid postage on it. Please place it in the mailbox as  soon as possible. Your physician should have your test results approximately 7 days after the  monitor has been mailed back to [facility].  Call [facility] Customer Care at [phone] if you have questions regarding  your ZIO XT patch monitor. Call them immediately if you see an orange light blinking on your  monitor.  If your monitor falls off in less than 4 days, contact our Monitor department at [phone].  If your monitor becomes loose or falls off after 4 days call [employer] at [phone] for  suggestions on securing your monitor

## 2023-09-02 ENCOUNTER — Other Ambulatory Visit: Payer: Self-pay | Admitting: Cardiology

## 2023-09-02 DIAGNOSIS — I471 Supraventricular tachycardia, unspecified: Secondary | ICD-10-CM

## 2023-09-08 ENCOUNTER — Encounter: Payer: Self-pay | Admitting: Podiatry

## 2023-09-08 ENCOUNTER — Ambulatory Visit: Admitting: Podiatry

## 2023-09-08 VITALS — Ht 59.0 in | Wt 179.0 lb

## 2023-09-08 DIAGNOSIS — M7662 Achilles tendinitis, left leg: Secondary | ICD-10-CM | POA: Diagnosis not present

## 2023-09-08 MED ORDER — MELOXICAM 15 MG PO TABS: 15.0000 mg | ORAL_TABLET | Freq: Every day | ORAL | 1 refills | Status: DC

## 2023-09-08 MED ORDER — BETAMETHASONE SOD PHOS & ACET 6 (3-3) MG/ML IJ SUSP
3.0000 mg | Freq: Once | INTRAMUSCULAR | Status: AC
  Administered 2023-09-08: 3 mg via INTRA_ARTICULAR

## 2023-09-08 NOTE — Progress Notes (Signed)
   Chief Complaint  Patient presents with   Foot Pain    Pt is here due to left foot/ankle pain, she states the pain has been there for over a month, no injury states she went to her PCP for this issue where x-rays were done and was told she has plantar fasciitis, pain is the heel of the foot.    HPI: 83 y.o. female presenting today as a reestablish new patient for evaluation of pain and tenderness associated to the left posterior heel.  Onset about 1 month ago.  X-rays were taken at her PCP and she was diagnosed with heel spur.  She continues to have pain and tenderness on the daily basis.  She believes it was started when her husband accidentally ran his walker into the back of her heel.  Past Medical History:  Diagnosis Date   Asthma    Cataract    Hyperlipidemia    Hypertension    Osteopenia determined by x-ray 11/2018   low FRAX score   Tachycardia     Past Surgical History:  Procedure Laterality Date   ABDOMINAL HYSTERECTOMY     EYE SURGERY Bilateral 2013   TOE SURGERY      No Known Allergies   Physical Exam: General: The patient is alert and oriented x3 in no acute distress.  Dermatology: Skin is warm, dry and supple bilateral lower extremities. Negative for open lesions or macerations.  Vascular: Palpable pedal pulses bilaterally. No edema or erythema noted. Capillary refill within normal limits.  Neurological: Grossly intact via light touch  Musculoskeletal Exam: Pain on palpation noted to the posterior tubercle of the left calcaneus at the insertion of the Achilles tendon consistent with retrocalcaneal bursitis. Range of motion within normal limits. Muscle strength 5/5 in all muscle groups bilateral lower extremities. There is no tenderness or pain with palpation to the plantar fascia  DG Foot Complete Left  IMPRESSION: 1. No acute osseous abnormality. 2. Calcaneal enthesopathy with soft tissue thickening at the level of the distal Achilles tendon insertion,  which could reflect Achilles tendinosis. 3. 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 4. Nonspecific subcutaneous edema of the visualized left lower leg and foot.  Assessment: 1. Insertional Achilles tendinitis left 2.  Posterior and plantar heel spur left  Plan of Care:  -Patient was evaluated. Radiographs were reviewed today. -Injection of 0.5 mL Celestone  Soluspan injected into the retrocalcaneal bursa. Care was taken to avoid direct injection into the Achilles tendon. -Prescription for meloxicam  15 mg daily.  Discontinue Advil -Recommend daily stretching exercises to stretch the Achilles tendon and alleviate any calf tightness -Return to clinic 4 weeks   Thresa EMERSON Sar, DPM [facility]  Dr. Thresa EMERSON Sar, DPM    [address], [place] [address]                Office [phone]  Fax [phone]

## 2023-09-20 DIAGNOSIS — I471 Supraventricular tachycardia, unspecified: Secondary | ICD-10-CM | POA: Diagnosis not present

## 2023-09-26 ENCOUNTER — Ambulatory Visit: Payer: Self-pay | Admitting: Cardiology

## 2023-10-06 ENCOUNTER — Ambulatory Visit: Admitting: Podiatry

## 2023-10-06 ENCOUNTER — Encounter: Payer: Self-pay | Admitting: Podiatry

## 2023-10-06 VITALS — Ht 59.0 in | Wt 179.0 lb

## 2023-10-06 DIAGNOSIS — M7662 Achilles tendinitis, left leg: Secondary | ICD-10-CM

## 2023-10-06 MED ORDER — BETAMETHASONE SOD PHOS & ACET 6 (3-3) MG/ML IJ SUSP
3.0000 mg | Freq: Once | INTRAMUSCULAR | Status: AC
  Administered 2023-10-06 (×2): 3 mg via INTRA_ARTICULAR

## 2023-10-06 NOTE — Progress Notes (Signed)
   Chief Complaint  Patient presents with   Foot Pain    Pt is here to f/u on left ankle due to achilles tendinitis, she states the pain is still there not as bad as before but there.    HPI: 83 y.o. female presenting today for follow-up evaluation of Achilles tendinitis of the left lower extremity.  She states that the posterior heel is no longer painful or tender but she is having some tenderness along the posterior leg about 8 cm proximal to the insertion of the Achilles onto the posterior calcaneus  Past Medical History:  Diagnosis Date   Asthma    Cataract    Hyperlipidemia    Hypertension    Osteopenia determined by x-ray 11/2018   low FRAX score   Tachycardia     Past Surgical History:  Procedure Laterality Date   ABDOMINAL HYSTERECTOMY     EYE SURGERY Bilateral 2013   TOE SURGERY      No Known Allergies   Physical Exam: General: The patient is alert and oriented x3 in no acute distress.  Dermatology: Skin is warm, dry and supple bilateral lower extremities. Negative for open lesions or macerations.  Vascular: Palpable pedal pulses bilaterally. No edema or erythema noted. Capillary refill within normal limits.  Neurological: Grossly intact via light touch  Musculoskeletal Exam: No tenderness or pain to the posterior tubercle of the calcaneus today.  The tenderness is more proximal along the posterior aspect of the leg as the gastroc aponeurosis consolidates in the Achilles tendon  DG Foot Complete Left  IMPRESSION: 1. No acute osseous abnormality. 2. Calcaneal enthesopathy with soft tissue thickening at the level of the distal Achilles tendon insertion, which could reflect Achilles tendinosis. 3. 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 4. Nonspecific subcutaneous edema of the visualized left lower leg and foot.  Assessment: 1. Insertional Achilles tendinitis left 2.  Posterior and plantar heel spur left  Plan of Care:  -Patient was evaluated.   -Injection of 0.5 cc Celestone  Soluspan injected more proximally as the gastric aponeurosis consolidates of the Achilles tendon -Continue meloxicam  15 mg daily PRN -Continue daily stretching exercises -Return to clinic PRN  Thresa EMERSON Sar, DPM [facility]  Dr. Thresa EMERSON Sar, DPM    [address] [place], [place] [address]                Office [phone]  Fax [phone]

## 2023-10-25 NOTE — Progress Notes (Unsigned)
 Electrophysiology Office Note:   Date:  10/26/2023  ID:  [name], DOB [birth year], MRN [id number]  Primary Cardiologist: Gordy Bergamo, MD Electrophysiologist: Fonda Kitty, MD      History of Present Illness:   Gina Guerrero is a 83 y.o. female with h/o asthma, hyperlipidemia, venous insufficiency and varicose veins of the lower extremity, SVT who is being seen today for evaluation of her SVT at the request of Dr. Bergamo.  She was referred by Dr. Bergamo for evaluation of her SVT. She experiences episodes of tachycardia intermittently, with the most significant episode occurring in 2020 when her heart rate reached 190 beats per minute. During these episodes, she feels tired but has no sensation of syncope or presyncope. The episodes generally last for a couple of minutes and have not been severe enough to require emergency room visits. She is currently taking diltiazem , which she believes helps in reducing the severity of the episodes.   Discussed the use of AI scribe software for clinical note transcription with the patient, who gave verbal consent to proceed.  History of Present Illness Gina Guerrero is an 83 year old female who presents for follow-up after wearing a heart monitor.  She wore a heart monitor that recorded multiple episodes of tachycardia, including one episode where her heart rate reached 130 beats per minute for nine hours.   She experiences dizziness and lightheadedness, particularly in the mornings, and sometimes takes Dramamine to address these symptoms. She notes feeling tired more frequently.  She is currently taking diltiazem  240 mg, but continues to experience episodes of tachycardia.  She is very independent. Her husband is also dealing with his own medical issues, including left leg weakness, and is scheduled to see another doctor soon.   Review of systems complete and found to be negative unless listed in HPI.   EP Information / Studies Reviewed:     EKG is not ordered today. EKG from 08/16/23 reviewed which showed sinus rhythm with left axis.     EKG 08/26/2018:    [facility] Echocardiogram 12/01/2019:  Normal LV systolic function with visual EF 50-55%. Left ventricle cavity  is normal in size. Normal global wall motion. Normal diastolic filling  pattern, normal LAP.  Mild (Grade I) aortic regurgitation.  Mild tricuspid regurgitation.  Compared to prior study dated 10/14/2019: No significant change noted.   Zio 09/16/23: Patch Wear Time:  14 days and 0 hours (2025-07-04T12:09:12-398 to 2025-07-18T12:09:12-398)   HR 49 - 197, average 70 bpm. 5 SVT episodes, longest lasting 9 hours and 33 minutes.  No atrial fibrillation detected. Occasional supraventricular ectopy, 1.6%. Rare ventricular ectopy. Symptom trigger episodes correspond to SVT and sinus with ectopy.   Physical Exam:   VS:  BP 114/71   Pulse 74   Ht 4' 11 (1.499 m)   Wt 174 lb (78.9 kg)   SpO2 96%   BMI 35.14 kg/m    Wt Readings from Last 3 Encounters:  10/26/23 174 lb (78.9 kg)  10/06/23 179 lb (81.2 kg)  09/08/23 179 lb (81.2 kg)     GEN: Well nourished, well developed in no acute distress NECK: No JVD CARDIAC: Normal rate, regular rhythm RESPIRATORY:  Clear to auscultation without rales, wheezing or rhonchi  ABDOMEN: Soft, non-distended EXTREMITIES:  No edema; No deformity   ASSESSMENT AND PLAN:    #. SVT: History of symptomatic and sustained episodes, longest 9 hours of recent Zio. Associated with dizziness, lightheadedness, fatigue.   #. Palpitations:  -Continue diltiazem   240mg  daily. Propranolol  as needed for sustained episodes.  -Risk, benefits, and alternatives to EP study and radiofrequency ablation for SVT were also discussed in detail today. These risks include but are not limited to complete heart block, stroke, bleeding, vascular damage, tamponade, perforation, and death. The patient understands these risk and wishes to proceed.  We will  therefore proceed with catheter ablation at the next available time.  Carto and ICE are requested for the procedure.    Follow up with Dr. Kennyth 3 months after EP study and ablation.   Signed, Fonda Kennyth, MD

## 2023-10-26 ENCOUNTER — Ambulatory Visit: Attending: Cardiology | Admitting: Cardiology

## 2023-10-26 ENCOUNTER — Encounter: Payer: Self-pay | Admitting: Cardiology

## 2023-10-26 VITALS — BP 114/71 | HR 74 | Ht 59.0 in | Wt 174.0 lb

## 2023-10-26 DIAGNOSIS — R002 Palpitations: Secondary | ICD-10-CM

## 2023-10-26 DIAGNOSIS — I471 Supraventricular tachycardia, unspecified: Secondary | ICD-10-CM

## 2023-10-26 NOTE — Patient Instructions (Signed)
 Medication Instructions:  Your physician recommends that you continue on your current medications as directed. Please refer to the Current Medication list given to you today.  *If you need a refill on your cardiac medications before your next appointment, please call your pharmacy*  Testing/Procedures: Ablation Your physician has recommended that you have an ablation. Catheter ablation is a medical procedure used to treat some cardiac arrhythmias (irregular heartbeats). During catheter ablation, a long, thin, flexible tube is put into a blood vessel in your groin (upper thigh), or neck. This tube is called an ablation catheter. It is then guided to your heart through the blood vessel. Radio frequency waves destroy small areas of heart tissue where abnormal heartbeats may cause an arrhythmia to start.   Follow-Up: At [facility], you and your health needs are our priority.  As part of our continuing mission to provide you with exceptional heart care, our providers are all part of one team.  This team includes your primary Cardiologist (physician) and Advanced Practice Providers or APPs (Physician Assistants and Nurse Practitioners) who all work together to provide you with the care you need, when you need it.  Please call us  when you decide to schedule your ablation

## 2023-11-04 ENCOUNTER — Other Ambulatory Visit: Payer: Self-pay | Admitting: Emergency Medicine

## 2023-11-04 DIAGNOSIS — Z1231 Encounter for screening mammogram for malignant neoplasm of breast: Secondary | ICD-10-CM

## 2023-11-10 ENCOUNTER — Other Ambulatory Visit: Payer: Self-pay | Admitting: Emergency Medicine

## 2023-11-10 DIAGNOSIS — L239 Allergic contact dermatitis, unspecified cause: Secondary | ICD-10-CM

## 2023-11-16 ENCOUNTER — Ambulatory Visit
Admission: RE | Admit: 2023-11-16 | Discharge: 2023-11-16 | Disposition: A | Discharge status: Home / Self Care | Source: Ambulatory Visit

## 2023-11-16 DIAGNOSIS — Z1231 Encounter for screening mammogram for malignant neoplasm of breast: Secondary | ICD-10-CM

## 2023-11-18 ENCOUNTER — Ambulatory Visit: Payer: Self-pay | Admitting: Emergency Medicine

## 2023-11-18 ENCOUNTER — Ambulatory Visit: Admitting: Emergency Medicine

## 2023-11-18 ENCOUNTER — Encounter: Payer: Self-pay | Admitting: Emergency Medicine

## 2023-11-18 VITALS — BP 126/80 | HR 75 | Temp 98.1°F | Ht 59.0 in | Wt 174.0 lb

## 2023-11-18 DIAGNOSIS — E785 Hyperlipidemia, unspecified: Secondary | ICD-10-CM | POA: Diagnosis not present

## 2023-11-18 DIAGNOSIS — I10 Essential (primary) hypertension: Secondary | ICD-10-CM | POA: Diagnosis not present

## 2023-11-18 DIAGNOSIS — R42 Dizziness and giddiness: Secondary | ICD-10-CM | POA: Insufficient documentation

## 2023-11-18 DIAGNOSIS — S8992XA Unspecified injury of left lower leg, initial encounter: Secondary | ICD-10-CM | POA: Diagnosis not present

## 2023-11-18 DIAGNOSIS — R7303 Prediabetes: Secondary | ICD-10-CM

## 2023-11-18 DIAGNOSIS — Z23 Encounter for immunization: Secondary | ICD-10-CM | POA: Diagnosis not present

## 2023-11-18 LAB — VITAMIN B12: Vitamin B-12: 368 pg/mL (ref 211–911)

## 2023-11-18 LAB — COMPREHENSIVE METABOLIC PANEL WITH GFR
ALT: 14 U/L (ref 0–35)
AST: 20 U/L (ref 0–37)
Albumin: 4.1 g/dL (ref 3.5–5.2)
Alkaline Phosphatase: 75 U/L (ref 39–117)
BUN: 18 mg/dL (ref 6–23)
CO2: 31 meq/L (ref 19–32)
Calcium: 9.8 mg/dL (ref 8.4–10.5)
Chloride: 102 meq/L (ref 96–112)
Creatinine, Ser: 0.74 mg/dL (ref 0.40–1.20)
GFR: 75.14 mL/min (ref >60.00)
Glucose, Bld: 97 mg/dL (ref 70–99)
Potassium: 4.3 meq/L (ref 3.5–5.1)
Sodium: 140 meq/L (ref 135–145)
Total Bilirubin: 0.6 mg/dL (ref 0.2–1.2)
Total Protein: 7.5 g/dL (ref 6.0–8.3)

## 2023-11-18 LAB — URINALYSIS
Bilirubin Urine: NEGATIVE
Hgb urine dipstick: NEGATIVE
Ketones, ur: NEGATIVE
Leukocytes,Ua: NEGATIVE
Nitrite: NEGATIVE
Specific Gravity, Urine: 1.02 (ref 1.000–1.030)
Total Protein, Urine: NEGATIVE
Urine Glucose: NEGATIVE
Urobilinogen, UA: 0.2 (ref 0.0–1.0)
pH: 5.5 (ref 5.0–8.0)

## 2023-11-18 LAB — CBC WITH DIFFERENTIAL/PLATELET
Basophils Absolute: 0 K/uL (ref 0.0–0.1)
Basophils Relative: 0.6 % (ref 0.0–3.0)
Eosinophils Absolute: 0.2 K/uL (ref 0.0–0.7)
Eosinophils Relative: 2.5 % (ref 0.0–5.0)
HCT: 40.6 % (ref 36.0–46.0)
Hemoglobin: 13.3 g/dL (ref 12.0–15.0)
Lymphocytes Relative: 24.4 % (ref 12.0–46.0)
Lymphs Abs: 1.5 K/uL (ref 0.7–4.0)
MCHC: 32.7 g/dL (ref 30.0–36.0)
MCV: 93.3 fl (ref 78.0–100.0)
Monocytes Absolute: 0.5 K/uL (ref 0.1–1.0)
Monocytes Relative: 8 % (ref 3.0–12.0)
Neutro Abs: 4 K/uL (ref 1.4–7.7)
Neutrophils Relative %: 64.5 % (ref 43.0–77.0)
Platelets: 220 K/uL (ref 150.0–400.0)
RBC: 4.35 Mil/uL (ref 3.87–5.11)
RDW: 14.8 % (ref 11.5–15.5)
WBC: 6.2 K/uL (ref 4.0–10.5)

## 2023-11-18 LAB — HEMOGLOBIN A1C: Hgb A1c MFr Bld: 6.1 % (ref 4.6–6.5)

## 2023-11-18 LAB — VITAMIN D 25 HYDROXY (VIT D DEFICIENCY, FRACTURES): VITD: 47.24 ng/mL (ref 30.00–100.00)

## 2023-11-18 NOTE — Progress Notes (Signed)
 Gina Guerrero 83 y.o.   Chief Complaint  Patient presents with   Dizziness   Leg Pain    HISTORY OF PRESENT ILLNESS: This is a 83 y.o. female complaining of left lower leg pain and swelling stemming from injury sustained 3 weeks ago Also complaining of intermittent dizziness for couple weeks No other complaints or medical concerns today.  Dizziness Pertinent negatives include no chest pain, chills, congestion, coughing, fever, rash or sore throat.  Leg Pain      Prior to Admission medications   Medication Sig Start Date End Date Taking? Authorizing Provider  albuterol  (ACCUNEB ) 0.63 MG/3ML nebulizer solution Inhalation 07/06/19   [provider]  aspirin EC 81 MG tablet Take 81 mg by mouth daily.    [provider]  atorvastatin  (LIPITOR) 10 MG tablet Oral 07/06/19   [provider]  azelastine  (ASTELIN ) 0.1 % nasal spray Place 2 sprays into both nostrils 2 (two) times daily as needed for rhinitis. 03/02/19   Iva Marty Saltness, MD  Cholecalciferol (VITAMIN D ) 50 MCG (2000 UT) CAPS Take 1 capsule (2,000 Units total) by mouth daily. 02/05/22   Purcell Emil Schanz, MD  diltiazem  (CARDIZEM  CD) 240 MG 24 hr capsule TAKE 1 CAPSULE(240 MG) BY MOUTH DAILY 02/26/23   Purcell Emil Schanz, MD  diphenhydrAMINE (BENADRYL) 25 MG tablet Take 25 mg by mouth every 6 (six) hours as needed.    [provider]  meloxicam  (MOBIC ) 15 MG tablet Take 1 tablet (15 mg total) by mouth daily. 09/08/23 01/06/24  Janit Thresa HERO, DPM  Multiple Vitamins-Minerals (PRESERVISION AREDS 2 PO) Take 1 tablet by mouth 2 (two) times daily.    [provider]  omeprazole  (PRILOSEC) 20 MG capsule Oral 07/06/19   [provider]  propranolol  (INDERAL ) 40 MG tablet TAKE 1 TABLET BY MOUTH THREE TIMES DAILY AS NEEDED FOR HEART RACING 09/02/23   Ladona Heinz, MD    No Known Allergies  Patient Active Problem List   Diagnosis Date Noted   Essential hypertension  08/06/2021   Dyslipidemia 08/06/2021   Prediabetes 04/23/2020   Mixed hyperlipidemia 04/23/2020   Osteopenia determined by x-ray 11/2018   Perennial allergic rhinitis 08/30/2018   Chronic cough 05/13/2016    Past Medical History:  Diagnosis Date   Asthma    Cataract    Hyperlipidemia    Hypertension    Osteopenia determined by x-ray 11/2018   low FRAX score   Tachycardia     Past Surgical History:  Procedure Laterality Date   ABDOMINAL HYSTERECTOMY     EYE SURGERY Bilateral 2013   TOE SURGERY      Social History   Socioeconomic History   Marital status: Married    Spouse name: Alm   Number of children: 0   Years of education: Not on file   Highest education level: 12th grade  Occupational History   Occupation: RETIRED  Tobacco Use   Smoking status: Never   Smokeless tobacco: Never  Vaping Use   Vaping status: Never Used  Substance and Sexual Activity   Alcohol  use: No   Drug use: Never   Sexual activity: Not on file  Other Topics Concern   Not on file  Social History Narrative   Lives with husband and is currently taking care of her niece who is dying of brain cancer.     No children.   Social Drivers of Corporate investment banker Strain: Low Risk  (07/25/2023)   Overall Financial Resource  Strain (CARDIA)    Difficulty of Paying Living Expenses: Not hard at all  Recent Concern: Financial Resource Strain - Medium Risk (05/18/2023)   Overall Financial Resource Strain (CARDIA)    Difficulty of Paying Living Expenses: Somewhat hard  Food Insecurity: No Food Insecurity (07/25/2023)   Hunger Vital Sign    Worried About Running Out of Food in the Last Year: Never true    Ran Out of Food in the Last Year: Never true  Transportation Needs: No Transportation Needs (07/25/2023)   PRAPARE - Administrator, Civil Service (Medical): No    Lack of Transportation (Non-Medical): No  Physical Activity: Sufficiently Active (07/25/2023)   Exercise Vital Sign     Days of Exercise per Week: 3 days    Minutes of Exercise per Session: 100 min  Recent Concern: Physical Activity - Insufficiently Active (05/18/2023)   Exercise Vital Sign    Days of Exercise per Week: 7 days    Minutes of Exercise per Session: 10 min  Stress: No Stress Concern Present (07/25/2023)   [employer] of Occupational Health - Occupational Stress Questionnaire    Feeling of Stress : Not at all  Social Connections: Socially Integrated (07/25/2023)   Social Connection and Isolation Panel    Frequency of Communication with Friends and Family: More than three times a week    Frequency of Social Gatherings with Friends and Family: More than three times a week    Attends Religious Services: 1 to 4 times per year    Active Member of [employer] or Organizations: Yes    Attends Engineer, structural: More than 4 times per year    Marital Status: Married  Catering manager Violence: Not At Risk (05/18/2023)   Humiliation, Afraid, Rape, and Kick questionnaire    Fear of Current or Ex-Partner: No    Emotionally Abused: No    Physically Abused: No    Sexually Abused: No    Family History  Problem Relation Age of Onset   Alopecia Father    Heart disease Sister    COPD Brother    Allergic rhinitis Neg Hx    Angioedema Neg Hx    Asthma Neg Hx    Eczema Neg Hx    Immunodeficiency Neg Hx    Urticaria Neg Hx    Breast cancer Neg Hx      Review of Systems  Constitutional: Negative.  Negative for chills and fever.  HENT: Negative.  Negative for congestion and sore throat.   Eyes: Negative.   Respiratory: Negative.  Negative for cough and shortness of breath.   Cardiovascular: Negative.  Negative for chest pain and palpitations.  Genitourinary: Negative.  Negative for dysuria.  Skin: Negative.  Negative for rash.  Neurological:  Positive for dizziness.  All other systems reviewed and are negative.   Today's Vitals   11/18/23 1259  BP: 126/80  Pulse: 75  Temp: 98.1 F  (36.7 C)  TempSrc: Oral  Weight: 174 lb (78.9 kg)  Height: 4' 11 (1.499 m)   Body mass index is 35.14 kg/m.   Physical Exam Vitals reviewed.  Constitutional:      Appearance: Normal appearance.  HENT:     Head: Normocephalic.     Mouth/Throat:     Mouth: Mucous membranes are moist.     Pharynx: Oropharynx is clear.  Eyes:     Extraocular Movements: Extraocular movements intact.     Conjunctiva/sclera: Conjunctivae normal.     Pupils: Pupils  are equal, round, and reactive to light.  Cardiovascular:     Rate and Rhythm: Normal rate and regular rhythm.     Pulses: Normal pulses.     Heart sounds: Normal heart sounds.  Pulmonary:     Effort: Pulmonary effort is normal.     Breath sounds: Normal breath sounds.  Abdominal:     Palpations: Abdomen is soft.     Tenderness: There is no abdominal tenderness.  Musculoskeletal:     Cervical back: No tenderness.     Comments: Left lower leg ankle area: Positive swelling and tenderness.  Full range of motion.  Lymphadenopathy:     Cervical: No cervical adenopathy.  Skin:    General: Skin is warm and dry.     Capillary Refill: Capillary refill takes less than 2 seconds.  Neurological:     General: No focal deficit present.     Mental Status: She is alert and oriented to person, place, and time.      ASSESSMENT & PLAN: A total of 42 minutes was spent with the patient and counseling/coordination of care regarding preparing for this visit, review of most recent office visit notes, review of multiple chronic medical conditions and their management, differential diagnosis of dizziness, need for orthopedic evaluation of left lower leg, review of all medications, review of most recent bloodwork results, review of health maintenance items, education on nutrition, prognosis, documentation, and need for follow up.   Problem List Items Addressed This Visit       Cardiovascular and Mediastinum   Essential hypertension   BP Readings  from Last 3 Encounters:  11/18/23 126/80  10/26/23 114/71  08/24/23 110/62  Well-controlled hypertension Continue diltiazem  240 mg daily         Other   Prediabetes   Diet and nutrition discussed Stable condition      Dyslipidemia   Stable chronic condition Presently off medication        Injury of left lower leg   Happened 3 weeks ago. Felt sharp tear to ankle area Swelling and pain since then Recommend orthopedic evaluation Referral to sports medicine placed today      Relevant Orders   Ambulatory referral to Sports Medicine   Dizziness - Primary   Clinically stable.  No red flag signs or symptoms. Differential diagnosis discussed with patient. Recommend blood work today as well as urinalysis and urine culture Advised to rest and stay well-hydrated Medication list reviewed with patient. ED precautions given Advised to contact the office if no better or worse during the next several days      Relevant Orders   CBC with Differential/Platelet   Comprehensive metabolic panel with GFR   Vitamin B12   VITAMIN D  25 Hydroxy (Vit-D Deficiency, Fractures)   Hemoglobin A1c   Urinalysis   Urine Culture   Other Visit Diagnoses       Flu vaccine need       Relevant Orders   Flu vaccine HIGH DOSE PF(Fluzone Trivalent)      Patient Instructions  Dizziness Dizziness is a common problem. It makes you feel unsteady or light-headed. You may feel like you're about to faint. Dizziness can lead to getting hurt if you stumble or fall. It's more common to feel dizzy if you're an older adult. Many things can cause you to feel dizzy. These include: Medicines. Dehydration. This is when there's not enough water in your body. Illness. Follow these instructions at home: Eating and drinking  Drink enough fluid  to keep your pee (urine) pale yellow. This helps keep you from getting dehydrated. Try to drink more clear fluids, such as water. Do not drink alcohol . Try to limit  how much caffeine you take in. Try to limit how much salt, also called sodium, you take in. Activity Try not to make quick movements. Stand up slowly from sitting in a chair. Steady yourself until you feel okay. In the morning, first sit up on the side of the bed. When you feel okay, hold onto something and slowly stand up. Do this until you know that your balance is okay. If you need to stand in one place for a long time, move your legs often. Tighten and relax the muscles in your legs while you're standing. Do not drive or use machines if you feel dizzy. Avoid bending down if you feel dizzy. Place items in your home so you can reach them without leaning over. Lifestyle Do not smoke, vape, or use products with nicotine or tobacco in them. If you need help quitting, talk with your health care provider. Try to lower your stress level. You can do this by using methods like yoga or meditation. Talk with your provider if you need help. General instructions Watch your dizziness for any changes. Take your medicines only as told by your provider. Talk with your provider if you think you're dizzy because of a medicine you're taking. Tell a friend or a family member that you're feeling dizzy. If they spot any changes in your behavior, have them call your provider. Contact a health care provider if: Your dizziness doesn't go away, or you have new symptoms. Your dizziness gets worse. You feel like you may vomit. You have trouble hearing. You have a fever. You have neck pain or a stiff neck. You fall or get hurt. Get help right away if: You vomit each time you eat or drink. You have watery poop and can't eat or drink. You have trouble talking, walking, swallowing, or using your arms, hands, or legs. You feel very weak. You're bleeding. You're not thinking clearly, or you have trouble forming sentences. A friend or family member may spot this. Your vision changes, or you get a very bad  headache. These symptoms may be an emergency. Call 911 right away. Do not wait to see if the symptoms will go away. Do not drive yourself to the hospital. This information is not intended to replace advice given to you by your health care provider. Make sure you discuss any questions you have with your health care provider. Document Revised: 11/12/2022 Document Reviewed: 03/26/2022 Elsevier Patient Education  2024 Elsevier Inc.    Emil Schaumann, MD [facility]

## 2023-11-18 NOTE — Assessment & Plan Note (Signed)
 Happened 3 weeks ago. Felt sharp tear to ankle area Swelling and pain since then Recommend orthopedic evaluation Referral to sports medicine placed today

## 2023-11-18 NOTE — Patient Instructions (Signed)
 Dizziness Dizziness is a common problem. It makes you feel unsteady or light-headed. You may feel like you're about to faint. Dizziness can lead to getting hurt if you stumble or fall. It's more common to feel dizzy if you're an older adult. Many things can cause you to feel dizzy. These include: Medicines. Dehydration. This is when there's not enough water in your body. Illness. Follow these instructions at home: Eating and drinking  Drink enough fluid to keep your pee (urine) pale yellow. This helps keep you from getting dehydrated. Try to drink more clear fluids, such as water. Do not drink alcohol. Try to limit how much caffeine you take in. Try to limit how much salt, also called sodium, you take in. Activity Try not to make quick movements. Stand up slowly from sitting in a chair. Steady yourself until you feel okay. In the morning, first sit up on the side of the bed. When you feel okay, hold onto something and slowly stand up. Do this until you know that your balance is okay. If you need to stand in one place for a long time, move your legs often. Tighten and relax the muscles in your legs while you're standing. Do not drive or use machines if you feel dizzy. Avoid bending down if you feel dizzy. Place items in your home so you can reach them without leaning over. Lifestyle Do not smoke, vape, or use products with nicotine or tobacco in them. If you need help quitting, talk with your health care provider. Try to lower your stress level. You can do this by using methods like yoga or meditation. Talk with your provider if you need help. General instructions Watch your dizziness for any changes. Take your medicines only as told by your provider. Talk with your provider if you think you're dizzy because of a medicine you're taking. Tell a friend or a family member that you're feeling dizzy. If they spot any changes in your behavior, have them call your provider. Contact a health care  provider if: Your dizziness doesn't go away, or you have new symptoms. Your dizziness gets worse. You feel like you may vomit. You have trouble hearing. You have a fever. You have neck pain or a stiff neck. You fall or get hurt. Get help right away if: You vomit each time you eat or drink. You have watery poop and can't eat or drink. You have trouble talking, walking, swallowing, or using your arms, hands, or legs. You feel very weak. You're bleeding. You're not thinking clearly, or you have trouble forming sentences. A friend or family member may spot this. Your vision changes, or you get a very bad headache. These symptoms may be an emergency. Call 911 right away. Do not wait to see if the symptoms will go away. Do not drive yourself to the hospital. This information is not intended to replace advice given to you by your health care provider. Make sure you discuss any questions you have with your health care provider. Document Revised: 11/12/2022 Document Reviewed: 03/26/2022 Elsevier Patient Education  2024 [employer].

## 2023-11-18 NOTE — Assessment & Plan Note (Signed)
 Clinically stable.  No red flag signs or symptoms. Differential diagnosis discussed with patient. Recommend blood work today as well as urinalysis and urine culture Advised to rest and stay well-hydrated Medication list reviewed with patient. ED precautions given Advised to contact the office if no better or worse during the next several days

## 2023-11-18 NOTE — Assessment & Plan Note (Addendum)
 BP Readings from Last 3 Encounters:  11/18/23 126/80  10/26/23 114/71  08/24/23 110/62  Well-controlled hypertension Continue diltiazem  240 mg daily

## 2023-11-18 NOTE — Assessment & Plan Note (Signed)
Stable chronic condition Presently off medication

## 2023-11-18 NOTE — Assessment & Plan Note (Signed)
Diet and nutrition discussed Stable condition

## 2023-11-19 LAB — URINE CULTURE: Result:: NO GROWTH

## 2023-11-23 NOTE — Progress Notes (Unsigned)
 [facility] [address] [place] [address] Phone: [phone]   Assessment and Plan:     1. High ankle sprain, unspecified laterality, initial encounter 2. Left foot pain (Primary) 3. Acute left ankle pain -Acute, initial visit - Most consistent with high ankle sprain, lateral ankle sprain occurring 4 weeks ago with moderate improvement in symptoms after starting meloxicam  course, relative rest - X-rays obtained in clinic.  My interpretation: No acute fracture or dislocation.  Degenerative changes in ankle, midfoot, digits - Start meloxicam  15 mg daily x2 weeks.  If still having pain after 2 weeks, complete 3rd-week of NSAID. May use remaining NSAID as needed once daily for pain control.  Do not to use additional over-the-counter NSAIDs (ibuprofen, naproxen, Advil, Aleve, etc.) while taking prescription NSAIDs.  May use Tylenol [phone] mg 2 to 3 times a day for breakthrough pain. - Goal of pain-free ambulation.  Continue to use cane for support.  Could use lace up ankle brace for additional support - Start gentle HEP to prevent stiffness and muscle atrophy  15 additional minutes spent for educating Therapeutic Home Exercise Program.  This included exercises focusing on stretching, strengthening, with focus on eccentric aspects.   Long term goals include an improvement in range of motion, strength, endurance as well as avoiding reinjury. Patient's frequency would include in 1-2 times a day, 3-5 times a week for a duration of 6-12 weeks. Proper technique shown and discussed handout in great detail with ATC.  All questions were discussed and answered.     Pertinent previous records reviewed include none   Follow Up: 3 weeks for reevaluation.  Could consider physical therapy   Subjective:   I, Aron Inge, am serving as a Neurosurgeon for Doctor Morene Mace  Chief Complaint: left leg pain   HPI:   11/24/2023 Patient is a 83  year old female with left leg pain. Patient states pain started as an sprain 10/28/2023 she tried to step on a bug a [place]. RICES, swelling is increased in the AM. She is TTP to certain spots. Meloxicam  helped very little. No numbness or tingling. Pain radiates to her calf if she presses on it.   Relevant Historical Information: Hypertension, prediabetes  Additional pertinent review of systems negative.   Current Outpatient Medications:    albuterol  (ACCUNEB ) 0.63 MG/3ML nebulizer solution, Inhalation, Disp: , Rfl:    aspirin EC 81 MG tablet, Take 81 mg by mouth daily., Disp: , Rfl:    azelastine  (ASTELIN ) 0.1 % nasal spray, Place 2 sprays into both nostrils 2 (two) times daily as needed for rhinitis., Disp: 30 mL, Rfl: 5   Cholecalciferol (VITAMIN D ) 50 MCG (2000 UT) CAPS, Take 1 capsule (2,000 Units total) by mouth daily., Disp: 90 capsule, Rfl: 3   diltiazem  (CARDIZEM  CD) 240 MG 24 hr capsule, TAKE 1 CAPSULE(240 MG) BY MOUTH DAILY, Disp: 90 capsule, Rfl: 3   diphenhydrAMINE (BENADRYL) 25 MG tablet, Take 25 mg by mouth every 6 (six) hours as needed., Disp: , Rfl:    Multiple Vitamins-Minerals (PRESERVISION AREDS 2 PO), Take 1 tablet by mouth 2 (two) times daily., Disp: , Rfl:    propranolol  (INDERAL ) 40 MG tablet, TAKE 1 TABLET BY MOUTH THREE TIMES DAILY AS NEEDED FOR HEART RACING, Disp: 30 tablet, Rfl: 0   Objective:     Vitals:   11/24/23 1348  BP: 122/80  Pulse: 78  SpO2: 98%  Weight: 174 lb (78.9 kg)  Height:  4' 11 (1.499 m)      Body mass index is 35.14 kg/m.    Physical Exam:    Gen: Appears well, nad, nontoxic and pleasant Psych: Alert and oriented, appropriate mood and affect Neuro: sensation intact, strength is 5/5 with df/pf/inv/ev, muscle tone wnl Skin: no susupicious lesions or rashes  Left ankle:  No deformity, Generalized left lower extremity swelling and effusion TTP lateral malleolus, ATFL, CFL NTTP over fibular head,   medial mal, achilles, navicular, base  of 5th,   deltoid, calcaneous or midfoot ROM DF 30, PF 45, inv/ev intact but reduced Pain with ant drawer, talar tilt, rotation test, squeeze test. Neg thompson   pain with resisted inversion or eversion    Electronically signed by:  Odis Mace D.[facility] 2:03 PM 11/24/23

## 2023-11-24 ENCOUNTER — Ambulatory Visit (INDEPENDENT_AMBULATORY_CARE_PROVIDER_SITE_OTHER): Admitting: Sports Medicine

## 2023-11-24 ENCOUNTER — Ambulatory Visit

## 2023-11-24 VITALS — BP 122/80 | HR 78 | Ht 59.0 in | Wt 174.0 lb

## 2023-11-24 DIAGNOSIS — M25572 Pain in left ankle and joints of left foot: Secondary | ICD-10-CM

## 2023-11-24 DIAGNOSIS — M79672 Pain in left foot: Secondary | ICD-10-CM

## 2023-11-24 DIAGNOSIS — S93439A Sprain of tibiofibular ligament of unspecified ankle, initial encounter: Secondary | ICD-10-CM

## 2023-11-24 NOTE — Patient Instructions (Signed)
 Ankle HEP   Lace up ankle brace   - Start meloxicam  15 mg daily x2 weeks.  If still having pain after 2 weeks, complete 3rd-week of NSAID. May use remaining NSAID as needed once daily for pain control.  Do not to use additional over-the-counter NSAIDs (ibuprofen, naproxen, Advil, Aleve, etc.) while taking prescription NSAIDs.  May use Tylenol [phone] mg 2 to 3 times a day for breakthrough pain.  3 week follow up

## 2023-11-29 ENCOUNTER — Ambulatory Visit: Payer: Self-pay | Admitting: Sports Medicine

## 2023-12-15 ENCOUNTER — Ambulatory Visit: Admitting: Sports Medicine

## 2023-12-15 NOTE — Progress Notes (Unsigned)
 Ben Jackson D.[facility] [address] [place] [address] Phone: [phone]   Assessment and Plan:     1. High ankle sprain, unspecified laterality, initial encounter (Primary) 2. Left foot pain 3. Acute left ankle pain -Subacute, improving, subsequent visit - Consistent with healing high ankle sprain - Use Tylenol 500 to 1000 mg tablets 2-3 times a day for day-to-day pain relief - Patient did not feel significant benefit with meloxicam .  May use remainder as needed - Continue goal of pain-free ambulation.  May use cane for support.  May use lace up ankle brace for additional support if needed - Continue HEP and start physical therapy with goal of preventing future ankle sprain.  Referral sent    Pertinent previous records reviewed include none   Follow Up: As needed   Subjective:   I, Suhaylah Wampole, am serving as a Neurosurgeon for Doctor Morene Mace   Chief Complaint: left leg pain    HPI:    11/24/2023 Patient is a 83 year old female with left leg pain. Patient states pain started as an sprain 10/28/2023 she tried to step on a bug a [place]. RICES, swelling is increased in the AM. She is TTP to certain spots. Meloxicam  helped very little. No numbness or tingling. Pain radiates to her calf if she presses on it.   12/16/2023 Patient states she still has swelling but pain is decreasing    Relevant Historical Information: Hypertension, prediabetes    Additional pertinent review of systems negative.   Current Outpatient Medications:    albuterol  (ACCUNEB ) 0.63 MG/3ML nebulizer solution, Inhalation, Disp: , Rfl:    aspirin EC 81 MG tablet, Take 81 mg by mouth daily., Disp: , Rfl:    azelastine  (ASTELIN ) 0.1 % nasal spray, Place 2 sprays into both nostrils 2 (two) times daily as needed for rhinitis., Disp: 30 mL, Rfl: 5   Cholecalciferol (VITAMIN D ) 50 MCG (2000 UT) CAPS, Take 1 capsule (2,000 Units total) by mouth daily., Disp: 90  capsule, Rfl: 3   diltiazem  (CARDIZEM  CD) 240 MG 24 hr capsule, TAKE 1 CAPSULE(240 MG) BY MOUTH DAILY, Disp: 90 capsule, Rfl: 3   diphenhydrAMINE (BENADRYL) 25 MG tablet, Take 25 mg by mouth every 6 (six) hours as needed., Disp: , Rfl:    Multiple Vitamins-Minerals (PRESERVISION AREDS 2 PO), Take 1 tablet by mouth 2 (two) times daily., Disp: , Rfl:    propranolol  (INDERAL ) 40 MG tablet, TAKE 1 TABLET BY MOUTH THREE TIMES DAILY AS NEEDED FOR HEART RACING, Disp: 30 tablet, Rfl: 0   Objective:     Vitals:   12/16/23 1356  Weight: 178 lb (80.7 kg)  Height: 4' 11 (1.499 m)      Body mass index is 35.95 kg/m.    Physical Exam:    Gen: Appears well, nad, nontoxic and pleasant Psych: Alert and oriented, appropriate mood and affect Neuro: sensation intact, strength is 5/5 with df/pf/inv/ev, muscle tone wnl Skin: no susupicious lesions or rashes   Left ankle:  No deformity, Generalized left lower extremity swelling and effusion TTP mildly lateral malleolus, ATFL, CFL NTTP over fibular head,   medial mal, achilles, navicular, base of 5th,   deltoid, calcaneous or midfoot ROM DF 30, PF 45, inv/ev intact but reduced Pain with ant drawer, talar tilt, rotation test, squeeze test. Neg thompson Mild pain with resisted inversion or eversion     Electronically signed by:  Odis Mace D[facility] 2:00  PM 12/16/23

## 2023-12-16 ENCOUNTER — Ambulatory Visit: Admitting: Sports Medicine

## 2023-12-16 VITALS — HR 72 | Ht 59.0 in | Wt 178.0 lb

## 2023-12-16 DIAGNOSIS — M79672 Pain in left foot: Secondary | ICD-10-CM | POA: Diagnosis not present

## 2023-12-16 DIAGNOSIS — S93439A Sprain of tibiofibular ligament of unspecified ankle, initial encounter: Secondary | ICD-10-CM

## 2023-12-16 DIAGNOSIS — M25572 Pain in left ankle and joints of left foot: Secondary | ICD-10-CM | POA: Diagnosis not present

## 2023-12-16 NOTE — Patient Instructions (Signed)
 Continue HEP   Tylenol [phone] mg 2-3 times a day for pain relief   PT referral   As needed follow up

## 2024-01-05 ENCOUNTER — Other Ambulatory Visit: Payer: Self-pay

## 2024-01-05 ENCOUNTER — Ambulatory Visit: Attending: Emergency Medicine

## 2024-01-05 DIAGNOSIS — M79672 Pain in left foot: Secondary | ICD-10-CM | POA: Diagnosis not present

## 2024-01-05 DIAGNOSIS — M6281 Muscle weakness (generalized): Secondary | ICD-10-CM | POA: Insufficient documentation

## 2024-01-05 DIAGNOSIS — S93439A Sprain of tibiofibular ligament of unspecified ankle, initial encounter: Secondary | ICD-10-CM | POA: Insufficient documentation

## 2024-01-05 DIAGNOSIS — M25572 Pain in left ankle and joints of left foot: Secondary | ICD-10-CM | POA: Diagnosis present

## 2024-01-05 NOTE — Therapy (Signed)
 OUTPATIENT PHYSICAL THERAPY LOWER EXTREMITY EVALUATION   Patient Name: Gina Guerrero MRN: [id number] DOB:11/08/1940, 83 y.o., female Today's Date: 01/05/2024  END OF SESSION:  PT End of Session - 01/05/24 1450     Visit Number 1    Number of Visits 16    Date for Recertification  03/06/24    Authorization Type [employer] P[employer]    PT Start Time 1450   pt late   PT Stop Time 1530    PT Time Calculation (min) 40 min    Activity Tolerance Patient tolerated treatment well          Past Medical History:  Diagnosis Date   Asthma    Cataract    Hyperlipidemia    Hypertension    Osteopenia determined by x-ray 11/2018   low FRAX score   Tachycardia    Past Surgical History:  Procedure Laterality Date   ABDOMINAL HYSTERECTOMY     EYE SURGERY Bilateral 2013   TOE SURGERY     Patient Active Problem List   Diagnosis Date Noted   Injury of left lower leg 11/18/2023   Dizziness 11/18/2023   Essential hypertension 08/06/2021   Dyslipidemia 08/06/2021   Prediabetes 04/23/2020   Mixed hyperlipidemia 04/23/2020   Osteopenia determined by x-ray 11/2018   Perennial allergic rhinitis 08/30/2018   Chronic cough 05/13/2016    PCP: [facility] Emil Schanz, MD PCP - General   REFERRING PROVIDER: Leonce Katz, DO Ref Provider   REFERRING DIAG:  [phone] (ICD-10-CM) - High ankle sprain, unspecified laterality, initial encounter  M79.672 (ICD-10-CM) - Left foot pain  M25.572 (ICD-10-CM) - Acute left ankle pain    THERAPY DIAG:  Pain in left ankle and joints of left foot  Muscle weakness (generalized)  Rationale for Evaluation and Treatment: rehabilitation  ONSET DATE: acute  SUBJECTIVE:   SUBJECTIVE STATEMENT: Severe ankle sprain trying to step on a bug. Didn't fall but hurt ankle bad. Has been trying to keep it moving as often as possible. Ankle swells up at end of day after being on it for too long.  PERTINENT HISTORY: HTN, osteopenia,  PAIN:  9W,  3C Are you having pain? Yes: NPRS scale: 10 Pain location: Left foot and shin Pain description: sharp pain initially, dull/achy (tingling middle toe) Aggravating factors: excessive  Relieving factors: n/a  PRECAUTIONS: None  RED FLAGS: None   WEIGHT BEARING RESTRICTIONS: No  FALLS:  Has patient fallen in last 6 months? No  OCCUPATION:   PLOF: Independent  PATIENT GOALS: walk without pain, decrease pain  OBJECTIVE:  Note: Objective measures were completed at Evaluation unless otherwise noted.   PATIENT SURVEYS:  LEFS  Extreme difficulty/unable (0), Quite a bit of difficulty (1), Moderate difficulty (2), Little difficulty (3), No difficulty (4) Survey date:    Any of your usual work, housework or school activities   2. Usual hobbies, recreational or sporting activities   3. Getting into/out of the bath   4. Walking between rooms   5. Putting on socks/shoes   6. Squatting    7. Lifting an object, like a bag of groceries from the floor   8. Performing light activities around your home   9. Performing heavy activities around your home   10. Getting into/out of a car   11. Walking 2 blocks   12. Walking 1 mile   13. Going up/down 10 stairs (1 flight)   14. Standing for 1 hour   15.  sitting for 1 hour  16. Running on even ground   17. Running on uneven ground   18. Making sharp turns while running fast   19. Hopping    20. Rolling over in bed   Score total:  57     COGNITION: Overall cognitive status: Within functional limits for tasks assessed     SENSATION: WFL Impaired sensation on L middle toe   POSTURE: rounded shoulders and forward head  PALPATION: TTP L ankle in lateral, medial side, achilles, anterior tib, calf, anterior TC joint areaf   LOWER EXTREMITY ROM:  Active ROM Right eval Left eval  Hip flexion wfl wfl  Hip extension    Hip abduction wfl wfl  Hip adduction wfl wfl  Hip internal rotation    Hip external rotation    Knee flexion  wfl wfl  Knee extension wfl wfl  Ankle dorsiflexion wfl wfl  Ankle plantarflexion wfl wfl  Ankle inversion wfl wfl  Ankle eversion wfl wfl   (Blank rows = not tested)  LOWER EXTREMITY MMT:  MMT Right eval Left eval  Hip flexion 4- 4-  Hip extension    Hip abduction 4 4  Hip adduction 4 4  Hip internal rotation    Hip external rotation    Knee flexion 4- 4-  Knee extension 4+ 4+  Ankle dorsiflexion 4 4  Ankle plantarflexion 4+ 4+  Ankle inversion 4 4  Ankle eversion 4 4   (Blank rows = not tested)  LOWER EXTREMITY SPECIAL TESTS:   -lateral tilt -medial tilt -high ankle sprain ROM WFL (minimal pain) Some general ankle inv and ev tenderness and calf tenderness                                                                                                                                  TREATMENT DATE:   TREATMENT 01/05/2024:  Therapeutic Exercise: Calf raises x8x3s Seated BL DF x8x3s Seated circles CW/CCW x8   Self-care/Home Management: Patient educated on HEP, POC, prognosis, and relevant tissues/anatomy.     PATIENT EDUCATION:  Education details: HEP Person educated: Patient Education method: Solicitor, and Handouts Education comprehension: verbalized understanding and returned demonstration  HOME EXERCISE PROGRAM: 5x/wk, 2x/day 2x8x3s Calf raises x8x3s Seated BL DF x8x3s Seated circles CW/CCW x8  ASSESSMENT:  CLINICAL IMPRESSION: EVAL: Patient is a 83 year old female who presents with L ankle pain s/p rolling ankle trying to step on bug. Patient presents with deficits in: excessive pain, ankle stability/control, strength, and overall activity tolerance. As a result, the patient would benefit from skilled PT to address aforementioned deficits via plan below.   OBJECTIVE IMPAIRMENTS: decreased balance, decreased strength, and pain.     PERSONAL FACTORS: Age, Time since onset of injury/illness/exacerbation, and 1-2  comorbidities:   are also affecting patient's functional outcome.   REHAB POTENTIAL: Good  CLINICAL DECISION MAKING: Stable/uncomplicated  EVALUATION COMPLEXITY: Moderate   GOALS: Goals reviewed with patient? No  SHORT TERM GOALS: Target date: 01/26/2024   1) Patient will demonstrate 75% HEP compliance to show independence with self-management of condition   Baseline: 0% Goal status: INITIAL  2) Patient will decrease worst pain to 7 at most to improve ADL completion and overall QOL   Baseline: 9 Goal status: INITIAL    LONG TERM GOALS: Target date: 03/06/24   1) Patient will demonstrate 100% HEP compliance to show independence with self-management of condition   Baseline: 0% Goal status: INITIAL  2) Patient will decrease worst pain to 5 at most to improve ADL completion and overall QOL   Baseline: 9 Goal status: INITIAL  3) Patient will demonstrate a 10 point improvement in LEFS to show improvements in ADL completion and overall QOL    Baseline: 57 Goal status: INITIAL  4) Patient will be able to walk at least 80% capacity to demonstrate improvements in LLE functional activity tolerance, strength, control, and balance.    Baseline: 25% Goal status: INITIAL      PLAN:  PT FREQUENCY: 1-2x/week  PT DURATION: 8 weeks  PLANNED INTERVENTIONS: 97110-Therapeutic exercises, 97530- Therapeutic activity, [id number]- Neuromuscular re-education, 97535- Self Care, [id number]- Manual therapy, and Patient/Family education  PLAN FOR NEXT SESSION: HEP assessment and progression, symptom modulation, and loading (isolated and/or functional). Manual therapy, aerobic, gait, and NME training as needed.     Washington Greener Eduardo Wurth  PT, DPT  01/05/2024, 4:06 PM

## 2024-01-13 ENCOUNTER — Ambulatory Visit: Payer: Self-pay

## 2024-01-13 DIAGNOSIS — M6281 Muscle weakness (generalized): Secondary | ICD-10-CM

## 2024-01-13 DIAGNOSIS — M25572 Pain in left ankle and joints of left foot: Secondary | ICD-10-CM

## 2024-01-13 NOTE — Therapy (Signed)
 OUTPATIENT PHYSICAL THERAPY LOWER EXTREMITY EVALUATION   Patient Name: Ameilia Rattan MRN: [id number] DOB:Oct 24, 1940, 83 y.o., female Today's Date: 01/13/2024  END OF SESSION:  PT End of Session - 01/13/24 1448     Visit Number 2    Number of Visits 16    Date for Recertification  03/06/24    Authorization Type [employer]    PT Start Time 1445    PT Stop Time 1525    PT Time Calculation (min) 40 min    Activity Tolerance Patient tolerated treatment well           Past Medical History:  Diagnosis Date   Asthma    Cataract    Hyperlipidemia    Hypertension    Osteopenia determined by x-ray 11/2018   low FRAX score   Tachycardia    Past Surgical History:  Procedure Laterality Date   ABDOMINAL HYSTERECTOMY     EYE SURGERY Bilateral 2013   TOE SURGERY     Patient Active Problem List   Diagnosis Date Noted   Injury of left lower leg 11/18/2023   Dizziness 11/18/2023   Essential hypertension 08/06/2021   Dyslipidemia 08/06/2021   Prediabetes 04/23/2020   Mixed hyperlipidemia 04/23/2020   Osteopenia determined by x-ray 11/2018   Perennial allergic rhinitis 08/30/2018   Chronic cough 05/13/2016    PCP: [facility] Emil Schanz, MD PCP - General   REFERRING PROVIDER: Leonce Katz, DO Ref Provider   REFERRING DIAG:  [phone] (ICD-10-CM) - High ankle sprain, unspecified laterality, initial encounter  M79.672 (ICD-10-CM) - Left foot pain  M25.572 (ICD-10-CM) - Acute left ankle pain    THERAPY DIAG:  Pain in left ankle and joints of left foot  Muscle weakness (generalized)  Rationale for Evaluation and Treatment: rehabilitation  ONSET DATE: acute  SUBJECTIVE:   SUBJECTIVE STATEMENT:  Doing really good today; no pain at rest. Exercises have been helping significantly and has doing them consistently.    Severe ankle sprain trying to step on a bug. Didn't fall but hurt ankle bad. Has been trying to keep it moving as often as  possible. Ankle swells up at end of day after being on it for too long.  PERTINENT HISTORY: HTN, osteopenia, dizziness PAIN:  9W, 0C Are you having pain? Yes: NPRS scale: 10 Pain location: Left foot and shin Pain description: sharp pain initially, dull/achy (tingling middle toe) Aggravating factors: excessive  Relieving factors: n/a  PRECAUTIONS: None  RED FLAGS: None   WEIGHT BEARING RESTRICTIONS: No  FALLS:  Has patient fallen in last 6 months? No  OCCUPATION:   PLOF: Independent  PATIENT GOALS: walk without pain, decrease pain  OBJECTIVE:  Note: Objective measures were completed at Evaluation unless otherwise noted.   PATIENT SURVEYS:  LEFS  Extreme difficulty/unable (0), Quite a bit of difficulty (1), Moderate difficulty (2), Little difficulty (3), No difficulty (4) Survey date:    Any of your usual work, housework or school activities   2. Usual hobbies, recreational or sporting activities   3. Getting into/out of the bath   4. Walking between rooms   5. Putting on socks/shoes   6. Squatting    7. Lifting an object, like a bag of groceries from the floor   8. Performing light activities around your home   9. Performing heavy activities around your home   10. Getting into/out of a car   11. Walking 2 blocks   12. Walking 1 mile   13. Going up/down  10 stairs (1 flight)   14. Standing for 1 hour   15.  sitting for 1 hour   16. Running on even ground   17. Running on uneven ground   18. Making sharp turns while running fast   19. Hopping    20. Rolling over in bed   Score total:  57     COGNITION: Overall cognitive status: Within functional limits for tasks assessed     SENSATION: WFL Impaired sensation on L middle toe   POSTURE: rounded shoulders and forward head  PALPATION: TTP L ankle in lateral, medial side, achilles, anterior tib, calf, anterior TC joint area   LOWER EXTREMITY ROM:  Active ROM Right eval Left eval  Hip flexion wfl  wfl  Hip extension    Hip abduction wfl wfl  Hip adduction wfl wfl  Hip internal rotation    Hip external rotation    Knee flexion wfl wfl  Knee extension wfl wfl  Ankle dorsiflexion wfl wfl  Ankle plantarflexion wfl wfl  Ankle inversion wfl wfl  Ankle eversion wfl wfl   (Blank rows = not tested)  LOWER EXTREMITY MMT:  MMT Right eval Left eval  Hip flexion 4- 4-  Hip extension    Hip abduction 4 4  Hip adduction 4 4  Hip internal rotation    Hip external rotation    Knee flexion 4- 4-  Knee extension 4+ 4+  Ankle dorsiflexion 4 4  Ankle plantarflexion 4+ 4+  Ankle inversion 4 4  Ankle eversion 4 4   (Blank rows = not tested)  LOWER EXTREMITY SPECIAL TESTS:   -lateral tilt -medial tilt -high ankle sprain ROM WFL (minimal pain) Some general ankle inv and ev tenderness and calf tenderness                                                                                                                                  TREATMENT DATE:  01/13/24   Therapeutic Exercise: Seated BL DF x8x3s, 2x8x3s RTB Seated  BL ankle circles x8 CW/CCW, 2x8 RTB  Seated GTB clamshells 2x8x3s Seated RTB ankle eversion 2x8x3s  Therapeutic activity: HEP reassessment and update Standing calf raise x8x3s BL UE support, 2x8x3s S UE support Standing weight shifts 2x8x3s w/ S UE support     TREATMENT 01/05/2024:  Therapeutic Exercise: Calf raises x8x3s Seated BL DF x8x3s Seated circles CW/CCW x8   Self-care/Home Management: Patient educated on HEP, POC, prognosis, and relevant tissues/anatomy.     PATIENT EDUCATION:  Education details: HEP Person educated: Patient Education method: Programmer, Multimedia, Facilities Manager, and Handouts Education comprehension: verbalized understanding and returned demonstration  HOME EXERCISE PROGRAM: 5x/wk, 2x/day 2x8x3s Calf raises x8x3s (single UE support) RTB Seated BL DF x8x3s RTB Seated circles CW/CCW x8  ASSESSMENT:  CLINICAL  IMPRESSION: Patient tolerated treatment with no increases in pain with progressions in single Left ankle loading and control, BL hip loading, SLS balance. Current deficits  include: functional activity tolerance, excessive pain, and L ankle stability and control. As a result, patient would continue to benefit from skilled PT to address said deficits via plan below.    EVAL: Patient is a 83 year old female who presents with L ankle pain s/p rolling ankle trying to step on bug. Patient presents with deficits in: excessive pain, ankle stability/control, strength, and overall activity tolerance. As a result, the patient would benefit from skilled PT to address aforementioned deficits via plan below.   OBJECTIVE IMPAIRMENTS: decreased balance, decreased strength, and pain.     PERSONAL FACTORS: Age, Time since onset of injury/illness/exacerbation, and 1-2 comorbidities:   are also affecting patient's functional outcome.   REHAB POTENTIAL: Good  CLINICAL DECISION MAKING: Stable/uncomplicated  EVALUATION COMPLEXITY: Moderate   GOALS: Goals reviewed with patient? No  SHORT TERM GOALS: Target date: 01/26/2024   1) Patient will demonstrate 75% HEP compliance to show independence with self-management of condition   Baseline: 0% Goal status: INITIAL  2) Patient will decrease worst pain to 7 at most to improve ADL completion and overall QOL   Baseline: 9 Goal status: INITIAL    LONG TERM GOALS: Target date: 03/06/24   1) Patient will demonstrate 100% HEP compliance to show independence with self-management of condition   Baseline: 0% Goal status: INITIAL  2) Patient will decrease worst pain to 5 at most to improve ADL completion and overall QOL   Baseline: 9 Goal status: INITIAL  3) Patient will demonstrate a 10 point improvement in LEFS to show improvements in ADL completion and overall QOL    Baseline: 57 Goal status: INITIAL  4) Patient will be able to walk at least  80% capacity to demonstrate improvements in LLE functional activity tolerance, strength, control, and balance.    Baseline: 25% Goal status: INITIAL      PLAN:  PT FREQUENCY: 1-2x/week  PT DURATION: 8 weeks  PLANNED INTERVENTIONS: 97110-Therapeutic exercises, 97530- Therapeutic activity, [id number]- Neuromuscular re-education, 97535- Self Care, [id number]- Manual therapy, and Patient/Family education  PLAN FOR NEXT SESSION: HEP assessment and progression, symptom modulation, and loading (isolated and/or functional). Manual therapy, aerobic, gait, and NME training as needed.     Washington Greener Maysen Bonsignore  PT, DPT  01/13/2024, 11:38 PM

## 2024-01-25 ENCOUNTER — Ambulatory Visit

## 2024-01-25 DIAGNOSIS — M25572 Pain in left ankle and joints of left foot: Secondary | ICD-10-CM | POA: Insufficient documentation

## 2024-01-25 DIAGNOSIS — M6281 Muscle weakness (generalized): Secondary | ICD-10-CM | POA: Diagnosis present

## 2024-01-25 NOTE — Therapy (Signed)
 OUTPATIENT PHYSICAL THERAPY TREATMENT   Patient Name: Gina Guerrero MRN: [id number] DOB:1940/10/07, 83 y.o., female Today's Date: 01/25/2024  END OF SESSION:  PT End of Session - 01/25/24 1403     Visit Number 3    Number of Visits 16    Date for Recertification  03/06/24    Authorization Type [employer]    PT Start Time 1403    PT Stop Time 1441    PT Time Calculation (min) 38 min    Activity Tolerance Patient tolerated treatment well            Past Medical History:  Diagnosis Date   Asthma    Cataract    Hyperlipidemia    Hypertension    Osteopenia determined by x-ray 11/2018   low FRAX score   Tachycardia    Past Surgical History:  Procedure Laterality Date   ABDOMINAL HYSTERECTOMY     EYE SURGERY Bilateral 2013   TOE SURGERY     Patient Active Problem List   Diagnosis Date Noted   Injury of left lower leg 11/18/2023   Dizziness 11/18/2023   Essential hypertension 08/06/2021   Dyslipidemia 08/06/2021   Prediabetes 04/23/2020   Mixed hyperlipidemia 04/23/2020   Osteopenia determined by x-ray 11/2018   Perennial allergic rhinitis 08/30/2018   Chronic cough 05/13/2016    PCP: Purcell Emil Schanz, MD PCP - General   REFERRING PROVIDER: Leonce Katz, DO Ref Provider   REFERRING DIAG:  [phone] (ICD-10-CM) - High ankle sprain, unspecified laterality, initial encounter  M79.672 (ICD-10-CM) - Left foot pain  M25.572 (ICD-10-CM) - Acute left ankle pain    THERAPY DIAG:  Pain in left ankle and joints of left foot  Muscle weakness (generalized)  Rationale for Evaluation and Treatment: rehabilitation  ONSET DATE: acute  SUBJECTIVE:   SUBJECTIVE STATEMENT: Pt presents to PT with reports of improved symptoms, only 2/10 L ankle pain. Has been compliant with HEP.   EVAL: Severe ankle sprain trying to step on a bug. Didn't fall but hurt ankle bad. Has been trying to keep it moving as often as possible. Ankle swells up at end  of day after being on it for too long.  PERTINENT HISTORY: HTN, osteopenia, dizziness PAIN:  9W, 0C Are you having pain?  Yes: NPRS scale: 10 Pain location: Left foot and shin Pain description: sharp pain initially, dull/achy (tingling middle toe) Aggravating factors: excessive  Relieving factors: n/a  PRECAUTIONS: None  RED FLAGS: None   WEIGHT BEARING RESTRICTIONS: No  FALLS:  Has patient fallen in last 6 months? No  OCCUPATION:   PLOF: Independent  PATIENT GOALS: walk without pain, decrease pain  OBJECTIVE:  Note: Objective measures were completed at Evaluation unless otherwise noted.   PATIENT SURVEYS:  LEFS  Extreme difficulty/unable (0), Quite a bit of difficulty (1), Moderate difficulty (2), Little difficulty (3), No difficulty (4) Survey date:    Any of your usual work, housework or school activities   2. Usual hobbies, recreational or sporting activities   3. Getting into/out of the bath   4. Walking between rooms   5. Putting on socks/shoes   6. Squatting    7. Lifting an object, like a bag of groceries from the floor   8. Performing light activities around your home   9. Performing heavy activities around your home   10. Getting into/out of a car   11. Walking 2 blocks   12. Walking 1 mile   13. Going up/down  10 stairs (1 flight)   14. Standing for 1 hour   15.  sitting for 1 hour   16. Running on even ground   17. Running on uneven ground   18. Making sharp turns while running fast   19. Hopping    20. Rolling over in bed   Score total:  57     COGNITION: Overall cognitive status: Within functional limits for tasks assessed     SENSATION: WFL Impaired sensation on L middle toe   POSTURE: rounded shoulders and forward head  PALPATION: TTP L ankle in lateral, medial side, achilles, anterior tib, calf, anterior TC joint area   LOWER EXTREMITY ROM:  Active ROM Right eval Left eval  Hip flexion wfl wfl  Hip extension    Hip  abduction wfl wfl  Hip adduction wfl wfl  Hip internal rotation    Hip external rotation    Knee flexion wfl wfl  Knee extension wfl wfl  Ankle dorsiflexion wfl wfl  Ankle plantarflexion wfl wfl  Ankle inversion wfl wfl  Ankle eversion wfl wfl   (Blank rows = not tested)  LOWER EXTREMITY MMT:  MMT Right eval Left eval  Hip flexion 4- 4-  Hip extension    Hip abduction 4 4  Hip adduction 4 4  Hip internal rotation    Hip external rotation    Knee flexion 4- 4-  Knee extension 4+ 4+  Ankle dorsiflexion 4 4  Ankle plantarflexion 4+ 4+  Ankle inversion 4 4  Ankle eversion 4 4   (Blank rows = not tested)  LOWER EXTREMITY SPECIAL TESTS:   -lateral tilt -medial tilt -high ankle sprain ROM WFL (minimal pain) Some general ankle inv and ev tenderness and calf tenderness                                                                                                                                  TREATMENT DATE:  01/25/24 STS 2x10 - no UE support L ankle DF/inv/ev 2x10 RTB Standing heel/toe 2x10 B UE Step ups fwd 2x10 L leading 8 in 1 UE Tandem stance at counter 3x30 L back FT on foam with head turns 2x30  Seated heel 2x15 15# KB L Standing hip abd 2x10 ea  01/13/24 Therapeutic Exercise: Seated BL DF x8x3s, 2x8x3s RTB Seated  BL ankle circles x8 CW/CCW, 2x8 RTB  Seated GTB clamshells 2x8x3s Seated RTB ankle eversion 2x8x3s  Therapeutic activity: HEP reassessment and update Standing calf raise x8x3s BL UE support, 2x8x3s S UE support Standing weight shifts 2x8x3s w/ S UE support     TREATMENT 01/05/2024:  Therapeutic Exercise: Calf raises x8x3s Seated BL DF x8x3s Seated circles CW/CCW x8   Self-care/Home Management: Patient educated on HEP, POC, prognosis, and relevant tissues/anatomy.     PATIENT EDUCATION:  Education details: HEP Person educated: Patient Education method: Explanation, Demonstration, and Handouts Education comprehension:  verbalized understanding and returned demonstration  HOME EXERCISE PROGRAM: 5x/wk, 2x/day 2x8x3s Calf raises x8x3s (single UE support) RTB Seated BL DF x8x3s RTB Seated circles CW/CCW x8  Access Code: [email or web address] URL: [email or web address] Date: 01/25/2024 Prepared by: Alm Kingdom  Exercises - Standing Tandem Balance with Counter Support  - 1 x daily - 7 x weekly - 2-3 sets - 30 sec hold - Corner Balance Feet Together: Eyes Open With Head Turns  - 1 x daily - 7 x weekly - 2-3 sets - 30 sec hold ASSESSMENT:  CLINICAL IMPRESSION: Pt was able to complete all prescribed exercises with no adverse effect. Today we progressed ankle strengthening and balance exercsies. HEP updated for addition of balance and stability work to L ankle. Pt is progressing well with therapy, will continue per POC.    EVAL: Patient is a 83 year old female who presents with L ankle pain s/p rolling ankle trying to step on bug. Patient presents with deficits in: excessive pain, ankle stability/control, strength, and overall activity tolerance. As a result, the patient would benefit from skilled PT to address aforementioned deficits via plan below.   OBJECTIVE IMPAIRMENTS: decreased balance, decreased strength, and pain.     PERSONAL FACTORS: Age, Time since onset of injury/illness/exacerbation, and 1-2 comorbidities:   are also affecting patient's functional outcome.   REHAB POTENTIAL: Good  CLINICAL DECISION MAKING: Stable/uncomplicated  EVALUATION COMPLEXITY: Moderate   GOALS: Goals reviewed with patient? No  SHORT TERM GOALS: Target date: 01/26/2024   1) Patient will demonstrate 75% HEP compliance to show independence with self-management of condition   Baseline: 0% Goal status: INITIAL  2) Patient will decrease worst pain to 7 at most to improve ADL completion and overall QOL   Baseline: 9 Goal status: INITIAL    LONG TERM GOALS: Target date: 03/06/24   1) Patient will  demonstrate 100% HEP compliance to show independence with self-management of condition   Baseline: 0% Goal status: INITIAL  2) Patient will decrease worst pain to 5 at most to improve ADL completion and overall QOL   Baseline: 9 Goal status: INITIAL  3) Patient will demonstrate a 10 point improvement in LEFS to show improvements in ADL completion and overall QOL    Baseline: 57 Goal status: INITIAL  4) Patient will be able to walk at least 80% capacity to demonstrate improvements in LLE functional activity tolerance, strength, control, and balance.    Baseline: 25% Goal status: INITIAL      PLAN:  PT FREQUENCY: 1-2x/week  PT DURATION: 8 weeks  PLANNED INTERVENTIONS: 97110-Therapeutic exercises, 97530- Therapeutic activity, [id number]- Neuromuscular re-education, 97535- Self Care, [id number]- Manual therapy, and Patient/Family education  PLAN FOR NEXT SESSION: HEP assessment and progression, symptom modulation, and loading (isolated and/or functional). Manual therapy, aerobic, gait, and NME training as needed.     Alm JAYSON Kingdom PT  01/25/24 3:26 PM

## 2024-01-27 ENCOUNTER — Ambulatory Visit: Admitting: Physical Therapy

## 2024-01-27 ENCOUNTER — Ambulatory Visit

## 2024-01-27 ENCOUNTER — Encounter: Payer: Self-pay | Admitting: Physical Therapy

## 2024-01-27 DIAGNOSIS — M25572 Pain in left ankle and joints of left foot: Secondary | ICD-10-CM | POA: Diagnosis not present

## 2024-01-27 DIAGNOSIS — M6281 Muscle weakness (generalized): Secondary | ICD-10-CM

## 2024-01-27 NOTE — Therapy (Signed)
 OUTPATIENT PHYSICAL THERAPY TREATMENT   Patient Name: Gina Guerrero MRN: [id number] DOB:August 22, 1940, 83 y.o., female Today's Date: 01/27/2024  END OF SESSION:  PT End of Session - 01/27/24 1638     Visit Number 4    Number of Visits 16    Date for Recertification  03/06/24    Authorization Type [employer]    PT Start Time 1620    PT Stop Time 1700    PT Time Calculation (min) 40 min    Activity Tolerance Patient tolerated treatment well    Behavior During Therapy [facility] for tasks assessed/performed             Past Medical History:  Diagnosis Date   Asthma    Cataract    Hyperlipidemia    Hypertension    Osteopenia determined by x-ray 11/2018   low FRAX score   Tachycardia    Past Surgical History:  Procedure Laterality Date   ABDOMINAL HYSTERECTOMY     EYE SURGERY Bilateral 2013   TOE SURGERY     Patient Active Problem List   Diagnosis Date Noted   Injury of left lower leg 11/18/2023   Dizziness 11/18/2023   Essential hypertension 08/06/2021   Dyslipidemia 08/06/2021   Prediabetes 04/23/2020   Mixed hyperlipidemia 04/23/2020   Osteopenia determined by x-ray 11/2018   Perennial allergic rhinitis 08/30/2018   Chronic cough 05/13/2016    PCP: [facility] Emil Schanz, MD PCP - General   REFERRING PROVIDER: Leonce Katz, DO Ref Provider   REFERRING DIAG:  [id number] (ICD-10-CM) - High ankle sprain, unspecified laterality, initial encounter  M79.672 (ICD-10-CM) - Left foot pain  M25.572 (ICD-10-CM) - Acute left ankle pain    THERAPY DIAG:  Pain in left ankle and joints of left foot  Muscle weakness (generalized)  Rationale for Evaluation and Treatment: rehabilitation  ONSET DATE: acute  SUBJECTIVE:   SUBJECTIVE STATEMENT: Pt states that she has been doing well since her previous session, reports compliance with HEP.   EVAL: Severe ankle sprain trying to step on a bug. Didn't fall but hurt ankle bad. Has been trying to keep it  moving as often as possible. Ankle swells up at end of day after being on it for too long.  PERTINENT HISTORY: HTN, osteopenia, dizziness PAIN:  9W, 0C Are you having pain?  Yes: NPRS scale: 10 Pain location: Left foot and shin Pain description: sharp pain initially, dull/achy (tingling middle toe) Aggravating factors: excessive  Relieving factors: n/a  PRECAUTIONS: None  RED FLAGS: None   WEIGHT BEARING RESTRICTIONS: No  FALLS:  Has patient fallen in last 6 months? No  OCCUPATION:   PLOF: Independent  PATIENT GOALS: walk without pain, decrease pain  OBJECTIVE:  Note: Objective measures were completed at Evaluation unless otherwise noted.   PATIENT SURVEYS:  LEFS  Extreme difficulty/unable (0), Quite a bit of difficulty (1), Moderate difficulty (2), Little difficulty (3), No difficulty (4) Survey date:    Any of your usual work, housework or school activities   2. Usual hobbies, recreational or sporting activities   3. Getting into/out of the bath   4. Walking between rooms   5. Putting on socks/shoes   6. Squatting    7. Lifting an object, like a bag of groceries from the floor   8. Performing light activities around your home   9. Performing heavy activities around your home   10. Getting into/out of a car   11. Walking 2 blocks   12.  Walking 1 mile   13. Going up/down 10 stairs (1 flight)   14. Standing for 1 hour   15.  sitting for 1 hour   16. Running on even ground   17. Running on uneven ground   18. Making sharp turns while running fast   19. Hopping    20. Rolling over in bed   Score total:  57     COGNITION: Overall cognitive status: Within functional limits for tasks assessed     SENSATION: WFL Impaired sensation on L middle toe   POSTURE: rounded shoulders and forward head  PALPATION: TTP L ankle in lateral, medial side, achilles, anterior tib, calf, anterior TC joint area   LOWER EXTREMITY ROM:  Active ROM Right eval Left eval   Hip flexion wfl wfl  Hip extension    Hip abduction wfl wfl  Hip adduction wfl wfl  Hip internal rotation    Hip external rotation    Knee flexion wfl wfl  Knee extension wfl wfl  Ankle dorsiflexion wfl wfl  Ankle plantarflexion wfl wfl  Ankle inversion wfl wfl  Ankle eversion wfl wfl   (Blank rows = not tested)  LOWER EXTREMITY MMT:  MMT Right eval Left eval  Hip flexion 4- 4-  Hip extension    Hip abduction 4 4  Hip adduction 4 4  Hip internal rotation    Hip external rotation    Knee flexion 4- 4-  Knee extension 4+ 4+  Ankle dorsiflexion 4 4  Ankle plantarflexion 4+ 4+  Ankle inversion 4 4  Ankle eversion 4 4   (Blank rows = not tested)  LOWER EXTREMITY SPECIAL TESTS:   -lateral tilt -medial tilt -high ankle sprain ROM WFL (minimal pain) Some general ankle inv and ev tenderness and calf tenderness                                                                                                                                  TREATMENT DATE:  [facility] Adult PT Treatment:                                                DATE: 01/27/24 Therapeutic Exercise: Standing knee flexion x10, 5 sec hold BLE with HHA for support Sit to Stand onto foam pad x15 with BUE hand use for push off Modified lunge in standing with unilateral HHA for support Lateral step downs from foam 3x10 BLE alternating between sets 3 way hip in standing x10 BLE Gait training: Unilateral TM wlaking LLE on belt x2 mins 0.5-0.    01/25/24 STS 2x10 - no UE support L ankle DF/inv/ev 2x10 RTB Standing heel/toe 2x10 B UE Step ups fwd 2x10 L leading 8 in 1 UE Tandem stance at counter 3x30 L back FT on foam with head  turns 2x30  Seated heel 2x15 15# KB L Standing hip abd 2x10 ea  01/13/24 Therapeutic Exercise: Seated BL DF x8x3s, 2x8x3s RTB Seated  BL ankle circles x8 CW/CCW, 2x8 RTB  Seated GTB clamshells 2x8x3s Seated RTB ankle eversion 2x8x3s  Therapeutic activity: HEP  reassessment and update Standing calf raise x8x3s BL UE support, 2x8x3s S UE support Standing weight shifts 2x8x3s w/ S UE support     TREATMENT 01/05/2024:  Therapeutic Exercise: Calf raises x8x3s Seated BL DF x8x3s Seated circles CW/CCW x8   Self-care/Home Management: Patient educated on HEP, POC, prognosis, and relevant tissues/anatomy.     PATIENT EDUCATION:  Education details: HEP Person educated: Patient Education method: Programmer, Multimedia, Facilities Manager, and Handouts Education comprehension: verbalized understanding and returned demonstration  HOME EXERCISE PROGRAM: 5x/wk, 2x/day 2x8x3s Calf raises x8x3s (single UE support) RTB Seated BL DF x8x3s RTB Seated circles CW/CCW x8  Access Code: [email or web address] URL: [email or web address] Date: 01/25/2024 Prepared by: Alm Kingdom  Exercises - Standing Tandem Balance with Counter Support  - 1 x daily - 7 x weekly - 2-3 sets - 30 sec hold - Corner Balance Feet Together: Eyes Open With Head Turns  - 1 x daily - 7 x weekly - 2-3 sets - 30 sec hold ASSESSMENT:  CLINICAL IMPRESSION: Pt has been responding well to her current regimen, notes decreased pain and improved tolerance to LLE weight bearing. She was able to progress functional movement patterns today without adverse symptoms in the ankle. Edema remains, but is overall improving, reduces overnight and returns as day progresses with dependent positioning. Pt stands to benefit from continued skilled physical therapy to address deficit areas and restore safety with activities and participations at home and in the community.     EVAL: Patient is a 83 year old female who presents with L ankle pain s/p rolling ankle trying to step on bug. Patient presents with deficits in: excessive pain, ankle stability/control, strength, and overall activity tolerance. As a result, the patient would benefit from skilled PT to address aforementioned deficits via plan below.   OBJECTIVE  IMPAIRMENTS: decreased balance, decreased strength, and pain.     PERSONAL FACTORS: Age, Time since onset of injury/illness/exacerbation, and 1-2 comorbidities:   are also affecting patient's functional outcome.   REHAB POTENTIAL: Good  CLINICAL DECISION MAKING: Stable/uncomplicated  EVALUATION COMPLEXITY: Moderate   GOALS: Goals reviewed with patient? No  SHORT TERM GOALS: Target date: 01/26/2024   1) Patient will demonstrate 75% HEP compliance to show independence with self-management of condition   Baseline: 0% Goal status: INITIAL  2) Patient will decrease worst pain to 7 at most to improve ADL completion and overall QOL   Baseline: 9 Goal status: INITIAL    LONG TERM GOALS: Target date: 03/06/24   1) Patient will demonstrate 100% HEP compliance to show independence with self-management of condition   Baseline: 0% Goal status: INITIAL  2) Patient will decrease worst pain to 5 at most to improve ADL completion and overall QOL   Baseline: 9 Goal status: INITIAL  3) Patient will demonstrate a 10 point improvement in LEFS to show improvements in ADL completion and overall QOL    Baseline: 57 Goal status: INITIAL  4) Patient will be able to walk at least 80% capacity to demonstrate improvements in LLE functional activity tolerance, strength, control, and balance.    Baseline: 25% Goal status: INITIAL      PLAN:  PT FREQUENCY: 1-2x/week  PT DURATION: 8 weeks  PLANNED INTERVENTIONS: 97110-Therapeutic exercises,  02469- Therapeutic activity, [id number]- Neuromuscular re-education, [phone]- Self Care, 02859- Manual therapy, and Patient/Family education  PLAN FOR NEXT SESSION: HEP assessment and progression, symptom modulation, and loading (isolated and/or functional). Manual therapy, aerobic, gait, and NME training as needed.     Stann DELENA Ohara PT  01/27/24 4:40 PM

## 2024-01-31 ENCOUNTER — Ambulatory Visit: Admitting: Emergency Medicine

## 2024-01-31 ENCOUNTER — Encounter: Payer: Self-pay | Admitting: Emergency Medicine

## 2024-01-31 VITALS — BP 126/80 | HR 80 | Temp 98.0°F | Ht 59.0 in | Wt 180.0 lb

## 2024-01-31 DIAGNOSIS — R7303 Prediabetes: Secondary | ICD-10-CM

## 2024-01-31 DIAGNOSIS — E785 Hyperlipidemia, unspecified: Secondary | ICD-10-CM | POA: Diagnosis not present

## 2024-01-31 DIAGNOSIS — I10 Essential (primary) hypertension: Secondary | ICD-10-CM

## 2024-01-31 LAB — POCT GLYCOSYLATED HEMOGLOBIN (HGB A1C): HbA1c POC (<> result, manual entry): 5.2 % (ref 4.0–5.6)

## 2024-01-31 NOTE — Progress Notes (Signed)
 Gina Guerrero 83 y.o.   Chief Complaint  Patient presents with   Follow-up    HISTORY OF PRESENT ILLNESS: This is a 84 y.o. female here for follow-up of multiple chronic medical conditions including hypertension and dyslipidemia Overall doing well.  Has no complaints or medical concerns today.  HPI   Prior to Admission medications   Medication Sig Start Date End Date Taking? Authorizing Provider  albuterol  (ACCUNEB ) 0.63 MG/3ML nebulizer solution Inhalation 07/06/19  Yes [provider]  aspirin EC 81 MG tablet Take 81 mg by mouth daily.   Yes [provider]  azelastine  (ASTELIN ) 0.1 % nasal spray Place 2 sprays into both nostrils 2 (two) times daily as needed for rhinitis. 03/02/19  Yes Iva Marty Saltness, MD  Cholecalciferol (VITAMIN D ) 50 MCG (2000 UT) CAPS Take 1 capsule (2,000 Units total) by mouth daily. 02/05/22  Yes Dennise Bamber, Emil Schanz, MD  diltiazem  (CARDIZEM  CD) 240 MG 24 hr capsule TAKE 1 CAPSULE(240 MG) BY MOUTH DAILY 02/26/23  Yes Shaletta Hinostroza Jose, MD  Multiple Vitamins-Minerals (PRESERVISION AREDS 2 PO) Take 1 tablet by mouth 2 (two) times daily.   Yes [provider]  propranolol  (INDERAL ) 40 MG tablet TAKE 1 TABLET BY MOUTH THREE TIMES DAILY AS NEEDED FOR HEART RACING 09/02/23  Yes Ladona Heinz, MD  diphenhydrAMINE (BENADRYL) 25 MG tablet Take 25 mg by mouth every 6 (six) hours as needed.    [provider]    No Known Allergies  Patient Active Problem List   Diagnosis Date Noted   Injury of left lower leg 11/18/2023   Dizziness 11/18/2023   Essential hypertension 08/06/2021   Dyslipidemia 08/06/2021   Prediabetes 04/23/2020   Mixed hyperlipidemia 04/23/2020   Osteopenia determined by x-ray 11/2018   Perennial allergic rhinitis 08/30/2018   Chronic cough 05/13/2016    Past Medical History:  Diagnosis Date   Asthma    Cataract    Hyperlipidemia    Hypertension    Osteopenia determined by x-ray 11/2018   low  FRAX score   Tachycardia     Past Surgical History:  Procedure Laterality Date   ABDOMINAL HYSTERECTOMY     EYE SURGERY Bilateral 2013   TOE SURGERY      Social History   Socioeconomic History   Marital status: Married    Spouse name: Alm   Number of children: 0   Years of education: Not on file   Highest education level: 12th grade  Occupational History   Occupation: RETIRED  Tobacco Use   Smoking status: Never   Smokeless tobacco: Never  Vaping Use   Vaping status: Never Used  Substance and Sexual Activity   Alcohol  use: No   Drug use: Never   Sexual activity: Not on file  Other Topics Concern   Not on file  Social History Narrative   Lives with husband and is currently taking care of her niece who is dying of brain cancer.     No children.   Social Drivers of Corporate Investment Banker Strain: Low Risk  (01/30/2024)   Overall Financial Resource Strain (CARDIA)    Difficulty of Paying Living Expenses: Not hard at all  Food Insecurity: No Food Insecurity (01/30/2024)   Hunger Vital Sign    Worried About Running Out of Food in the Last Year: Never true    Ran Out of Food in the Last Year: Never true  Transportation Needs: Unknown (01/30/2024)   PRAPARE - Transportation  Lack of Transportation (Medical): Not on file    Lack of Transportation (Non-Medical): No  Physical Activity: Insufficiently Active (01/30/2024)   Exercise Vital Sign    Days of Exercise per Week: 3 days    Minutes of Exercise per Session: 20 min  Stress: No Stress Concern Present (01/30/2024)   [employer] of Occupational Health - Occupational Stress Questionnaire    Feeling of Stress: Not at all  Social Connections: Unknown (01/30/2024)   Social Connection and Isolation Panel    Frequency of Communication with Friends and Family: Not on file    Frequency of Social Gatherings with Friends and Family: Once a week    Attends Religious Services: 1 to 4 times per year    Active Member of  [employer] or Organizations: Yes    Attends Banker Meetings: 1 to 4 times per year    Marital Status: Married  Catering Manager Violence: Not At Risk (05/18/2023)   Humiliation, Afraid, Rape, and Kick questionnaire    Fear of Current or Ex-Partner: No    Emotionally Abused: No    Physically Abused: No    Sexually Abused: No    Family History  Problem Relation Age of Onset   Alopecia Father    Heart disease Sister    COPD Brother    Allergic rhinitis Neg Hx    Angioedema Neg Hx    Asthma Neg Hx    Eczema Neg Hx    Immunodeficiency Neg Hx    Urticaria Neg Hx    Breast cancer Neg Hx      Review of Systems  Constitutional: Negative.  Negative for chills and fever.  HENT: Negative.  Negative for congestion and sore throat.   Respiratory: Negative.  Negative for cough and shortness of breath.   Cardiovascular: Negative.  Negative for chest pain and palpitations.  Gastrointestinal: Negative.  Negative for abdominal pain, diarrhea, nausea and vomiting.  Genitourinary: Negative.  Negative for dysuria and hematuria.  Skin: Negative.  Negative for rash.  Neurological: Negative.  Negative for dizziness and headaches.  All other systems reviewed and are negative.   Today's Vitals   01/31/24 1345  BP: 126/80  Pulse: 80  Temp: 98 F (36.7 C)  TempSrc: Oral  SpO2: 97%  Weight: 180 lb (81.6 kg)  Height: 4' 11 (1.499 m)   Body mass index is 36.36 kg/m.   Physical Exam Vitals reviewed.  Constitutional:      Appearance: Normal appearance.  HENT:     Head: Normocephalic.     Mouth/Throat:     Mouth: Mucous membranes are moist.     Pharynx: Oropharynx is clear.  Eyes:     Extraocular Movements: Extraocular movements intact.     Conjunctiva/sclera: Conjunctivae normal.     Pupils: Pupils are equal, round, and reactive to light.  Cardiovascular:     Rate and Rhythm: Normal rate and regular rhythm.     Pulses: Normal pulses.     Heart sounds: Normal heart sounds.   Pulmonary:     Effort: Pulmonary effort is normal.     Breath sounds: Normal breath sounds.  Abdominal:     Palpations: Abdomen is soft.     Tenderness: There is no abdominal tenderness.  Musculoskeletal:     Cervical back: No tenderness.  Lymphadenopathy:     Cervical: No cervical adenopathy.  Skin:    General: Skin is warm and dry.     Capillary Refill: Capillary refill takes less than 2  seconds.  Neurological:     General: No focal deficit present.     Mental Status: She is alert and oriented to person, place, and time.  Psychiatric:        Mood and Affect: Mood normal.        Behavior: Behavior normal.    Results for orders placed or performed in visit on 01/31/24 (from the past 24 hours)  POCT HgB A1C     Status: Normal   Collection Time: 01/31/24  1:57 PM  Result Value Ref Range   Hemoglobin A1C     HbA1c POC (<> result, manual entry) 5.2 4.0 - 5.6 %   HbA1c, POC (prediabetic range)     HbA1c, POC (controlled diabetic range)       ASSESSMENT & PLAN: A total of 40 minutes was spent with the patient and counseling/coordination of care regarding preparing for this visit, review of most recent office visit notes, review of multiple chronic medical conditions and their management, review of all medications, review of most recent bloodwork results, review of health maintenance items, education on nutrition, prognosis, documentation, and need for follow up.   Problem List Items Addressed This Visit       Cardiovascular and Mediastinum   Essential hypertension - Primary   BP Readings from Last 3 Encounters:  01/31/24 126/80  11/24/23 122/80  11/18/23 126/80  Well-controlled hypertension Continue diltiazem  240 mg daily         Other   Prediabetes   Lab Results  Component Value Date   HGBA1C 5.2 01/31/2024  Diet and nutrition discussed Stable condition       Relevant Orders   POCT HgB A1C (Completed)   Dyslipidemia   Stable chronic condition Presently  off medication   Lab Results  Component Value Date   CHOL 189 02/04/2023   HDL 59.40 02/04/2023   LDLCALC 117 (H) 02/04/2023   TRIG 61.0 02/04/2023   CHOLHDL 3 02/04/2023         Obesity, morbid (HCC)   Diet and nutrition discussed Advised to decrease amount of daily carbohydrate intake and daily calories and increase amount of plant-based protein in her diet Wt Readings from Last 3 Encounters:  01/31/24 180 lb (81.6 kg)  12/16/23 178 lb (80.7 kg)  11/24/23 174 lb (78.9 kg)         Patient Instructions  Health Maintenance After Age 47 After age 76, you are at a higher risk for certain long-term diseases and infections as well as injuries from falls. Falls are a major cause of broken bones and head injuries in people who are older than age 37. Getting regular preventive care can help to keep you healthy and well. Preventive care includes getting regular testing and making lifestyle changes as recommended by your health care provider. Talk with your health care provider about: Which screenings and tests you should have. A screening is a test that checks for a disease when you have no symptoms. A diet and exercise plan that is right for you. What should I know about screenings and tests to prevent falls? Screening and testing are the best ways to find a health problem early. Early diagnosis and treatment give you the best chance of managing medical conditions that are common after age 68. Certain conditions and lifestyle choices may make you more likely to have a fall. Your health care provider may recommend: Regular vision checks. Poor vision and conditions such as cataracts can make you more likely to  have a fall. If you wear glasses, make sure to get your prescription updated if your vision changes. Medicine review. Work with your health care provider to regularly review all of the medicines you are taking, including over-the-counter medicines. Ask your health care provider about any  side effects that may make you more likely to have a fall. Tell your health care provider if any medicines that you take make you feel dizzy or sleepy. Strength and balance checks. Your health care provider may recommend certain tests to check your strength and balance while standing, walking, or changing positions. Foot health exam. Foot pain and numbness, as well as not wearing proper footwear, can make you more likely to have a fall. Screenings, including: Osteoporosis screening. Osteoporosis is a condition that causes the bones to get weaker and break more easily. Blood pressure screening. Blood pressure changes and medicines to control blood pressure can make you feel dizzy. Depression screening. You may be more likely to have a fall if you have a fear of falling, feel depressed, or feel unable to do activities that you used to do. Alcohol  use screening. Using too much alcohol  can affect your balance and may make you more likely to have a fall. Follow these instructions at home: Lifestyle Do not drink alcohol  if: Your health care provider tells you not to drink. If you drink alcohol : Limit how much you have to: 0-1 drink a day for women. 0-2 drinks a day for men. Know how much alcohol  is in your drink. In the [place] one drink equals one 12 oz bottle of beer (355 mL), one 5 oz glass of wine (148 mL), or one 1 oz glass of hard liquor (44 mL). Do not use any products that contain nicotine or tobacco. These products include cigarettes, chewing tobacco, and vaping devices, such as e-cigarettes. If you need help quitting, ask your health care provider. Activity  Follow a regular exercise program to stay fit. This will help you maintain your balance. Ask your health care provider what types of exercise are appropriate for you. If you need a cane or walker, use it as recommended by your health care provider. Wear supportive shoes that have nonskid soles. Safety  Remove any tripping hazards,  such as rugs, cords, and clutter. Install safety equipment such as grab bars in bathrooms and safety rails on stairs. Keep rooms and walkways well-lit. General instructions Talk with your health care provider about your risks for falling. Tell your health care provider if: You fall. Be sure to tell your health care provider about all falls, even ones that seem minor. You feel dizzy, tiredness (fatigue), or off-balance. Take over-the-counter and prescription medicines only as told by your health care provider. These include supplements. Eat a healthy diet and maintain a healthy weight. A healthy diet includes low-fat dairy products, low-fat (lean) meats, and fiber from whole grains, beans, and lots of fruits and vegetables. Stay current with your vaccines. Schedule regular health, dental, and eye exams. Summary Having a healthy lifestyle and getting preventive care can help to protect your health and wellness after age 43. Screening and testing are the best way to find a health problem early and help you avoid having a fall. Early diagnosis and treatment give you the best chance for managing medical conditions that are more common for people who are older than age 49. Falls are a major cause of broken bones and head injuries in people who are older than age 39. Take precautions to  prevent a fall at home. Work with your health care provider to learn what changes you can make to improve your health and wellness and to prevent falls. This information is not intended to replace advice given to you by your health care provider. Make sure you discuss any questions you have with your health care provider. Document Revised: 07/01/2020 Document Reviewed: 07/01/2020 Elsevier Patient Education  2024 Elsevier Inc.         Emil Schaumann, MD [facility]

## 2024-01-31 NOTE — Assessment & Plan Note (Signed)
 Lab Results  Component Value Date   HGBA1C 5.2 01/31/2024  Diet and nutrition discussed Stable condition

## 2024-01-31 NOTE — Patient Instructions (Signed)
 Health Maintenance After Age 83 After age 27, you are at a higher risk for certain long-term diseases and infections as well as injuries from falls. Falls are a major cause of broken bones and head injuries in people who are older than age 73. Getting regular preventive care can help to keep you healthy and well. Preventive care includes getting regular testing and making lifestyle changes as recommended by your health care provider. Talk with your health care provider about: Which screenings and tests you should have. A screening is a test that checks for a disease when you have no symptoms. A diet and exercise plan that is right for you. What should I know about screenings and tests to prevent falls? Screening and testing are the best ways to find a health problem early. Early diagnosis and treatment give you the best chance of managing medical conditions that are common after age 90. Certain conditions and lifestyle choices may make you more likely to have a fall. Your health care provider may recommend: Regular vision checks. Poor vision and conditions such as cataracts can make you more likely to have a fall. If you wear glasses, make sure to get your prescription updated if your vision changes. Medicine review. Work with your health care provider to regularly review all of the medicines you are taking, including over-the-counter medicines. Ask your health care provider about any side effects that may make you more likely to have a fall. Tell your health care provider if any medicines that you take make you feel dizzy or sleepy. Strength and balance checks. Your health care provider may recommend certain tests to check your strength and balance while standing, walking, or changing positions. Foot health exam. Foot pain and numbness, as well as not wearing proper footwear, can make you more likely to have a fall. Screenings, including: Osteoporosis screening. Osteoporosis is a condition that causes  the bones to get weaker and break more easily. Blood pressure screening. Blood pressure changes and medicines to control blood pressure can make you feel dizzy. Depression screening. You may be more likely to have a fall if you have a fear of falling, feel depressed, or feel unable to do activities that you used to do. Alcohol  use screening. Using too much alcohol  can affect your balance and may make you more likely to have a fall. Follow these instructions at home: Lifestyle Do not drink alcohol  if: Your health care provider tells you not to drink. If you drink alcohol : Limit how much you have to: 0-1 drink a day for women. 0-2 drinks a day for men. Know how much alcohol  is in your drink. In the [place] one drink equals one 12 oz bottle of beer (355 mL), one 5 oz glass of wine (148 mL), or one 1 oz glass of hard liquor (44 mL). Do not use any products that contain nicotine or tobacco. These products include cigarettes, chewing tobacco, and vaping devices, such as e-cigarettes. If you need help quitting, ask your health care provider. Activity  Follow a regular exercise program to stay fit. This will help you maintain your balance. Ask your health care provider what types of exercise are appropriate for you. If you need a cane or walker, use it as recommended by your health care provider. Wear supportive shoes that have nonskid soles. Safety  Remove any tripping hazards, such as rugs, cords, and clutter. Install safety equipment such as grab bars in bathrooms and safety rails on stairs. Keep rooms and walkways  well-lit. General instructions Talk with your health care provider about your risks for falling. Tell your health care provider if: You fall. Be sure to tell your health care provider about all falls, even ones that seem minor. You feel dizzy, tiredness (fatigue), or off-balance. Take over-the-counter and prescription medicines only as told by your health care provider. These include  supplements. Eat a healthy diet and maintain a healthy weight. A healthy diet includes low-fat dairy products, low-fat (lean) meats, and fiber from whole grains, beans, and lots of fruits and vegetables. Stay current with your vaccines. Schedule regular health, dental, and eye exams. Summary Having a healthy lifestyle and getting preventive care can help to protect your health and wellness after age 15. Screening and testing are the best way to find a health problem early and help you avoid having a fall. Early diagnosis and treatment give you the best chance for managing medical conditions that are more common for people who are older than age 42. Falls are a major cause of broken bones and head injuries in people who are older than age 64. Take precautions to prevent a fall at home. Work with your health care provider to learn what changes you can make to improve your health and wellness and to prevent falls. This information is not intended to replace advice given to you by your health care provider. Make sure you discuss any questions you have with your health care provider. Document Revised: 07/01/2020 Document Reviewed: 07/01/2020 Elsevier Patient Education  2024 [employer].

## 2024-01-31 NOTE — Assessment & Plan Note (Signed)
 Diet and nutrition discussed Advised to decrease amount of daily carbohydrate intake and daily calories and increase amount of plant-based protein in her diet Wt Readings from Last 3 Encounters:  01/31/24 180 lb (81.6 kg)  12/16/23 178 lb (80.7 kg)  11/24/23 174 lb (78.9 kg)

## 2024-01-31 NOTE — Assessment & Plan Note (Signed)
 Stable chronic condition Presently off medication   Lab Results  Component Value Date   CHOL 189 02/04/2023   HDL 59.40 02/04/2023   LDLCALC 117 (H) 02/04/2023   TRIG 61.0 02/04/2023   CHOLHDL 3 02/04/2023

## 2024-01-31 NOTE — Assessment & Plan Note (Signed)
 BP Readings from Last 3 Encounters:  01/31/24 126/80  11/24/23 122/80  11/18/23 126/80  Well-controlled hypertension Continue diltiazem  240 mg daily

## 2024-02-15 ENCOUNTER — Ambulatory Visit

## 2024-02-15 DIAGNOSIS — M25572 Pain in left ankle and joints of left foot: Secondary | ICD-10-CM

## 2024-02-15 DIAGNOSIS — M6281 Muscle weakness (generalized): Secondary | ICD-10-CM

## 2024-02-15 NOTE — Therapy (Signed)
 " OUTPATIENT PHYSICAL THERAPY TREATMENT   Patient Name: Gina Guerrero MRN: [id number] DOB:1940/08/15, 83 y.o., female Today's Date: 02/15/2024  END OF SESSION:  PT End of Session - 02/15/24 1521     Visit Number 5    Number of Visits 16    Date for Recertification  03/06/24    Authorization Type [employer]    PT Start Time 1530    PT Stop Time 1610    PT Time Calculation (min) 40 min    Activity Tolerance Patient tolerated treatment well    Behavior During Therapy [facility] for tasks assessed/performed              Past Medical History:  Diagnosis Date   Asthma    Cataract    Hyperlipidemia    Hypertension    Osteopenia determined by x-ray 11/2018   low FRAX score   Tachycardia    Past Surgical History:  Procedure Laterality Date   ABDOMINAL HYSTERECTOMY     EYE SURGERY Bilateral 2013   TOE SURGERY     Patient Active Problem List   Diagnosis Date Noted   Obesity, morbid (HCC) 01/31/2024   Essential hypertension 08/06/2021   Dyslipidemia 08/06/2021   Prediabetes 04/23/2020   Osteopenia determined by x-ray 11/2018   Perennial allergic rhinitis 08/30/2018   Chronic cough 05/13/2016    PCP: Purcell Emil Schanz, MD PCP - General   REFERRING PROVIDER: Leonce Katz, DO Ref Provider   REFERRING DIAG:  [phone] (ICD-10-CM) - High ankle sprain, unspecified laterality, initial encounter  M79.672 (ICD-10-CM) - Left foot pain  M25.572 (ICD-10-CM) - Acute left ankle pain    THERAPY DIAG:  Pain in left ankle and joints of left foot  Muscle weakness (generalized)  Rationale for Evaluation and Treatment: rehabilitation  ONSET DATE: acute  SUBJECTIVE:   SUBJECTIVE STATEMENT: Pt states that she has been doing well since her previous session, reports compliance with HEP. No pain today. Swelling is present but continuing.   EVAL: Severe ankle sprain trying to step on a bug. Didn't fall but hurt ankle bad. Has been trying to keep it moving  as often as possible. Ankle swells up at end of day after being on it for too long.  PERTINENT HISTORY: HTN, osteopenia, dizziness PAIN:  9W, 0C Are you having pain?  Yes: NPRS scale: 10 Pain location: Left foot and shin Pain description: sharp pain initially, dull/achy (tingling middle toe) Aggravating factors: excessive  Relieving factors: n/a  PRECAUTIONS: None  RED FLAGS: None   WEIGHT BEARING RESTRICTIONS: No  FALLS:  Has patient fallen in last 6 months? No  OCCUPATION:   PLOF: Independent  PATIENT GOALS: walk without pain, decrease pain  OBJECTIVE:  Note: Objective measures were completed at Evaluation unless otherwise noted.   PATIENT SURVEYS:  LEFS  Extreme difficulty/unable (0), Quite a bit of difficulty (1), Moderate difficulty (2), Little difficulty (3), No difficulty (4) Survey date:    Any of your usual work, housework or school activities   2. Usual hobbies, recreational or sporting activities   3. Getting into/out of the bath   4. Walking between rooms   5. Putting on socks/shoes   6. Squatting    7. Lifting an object, like a bag of groceries from the floor   8. Performing light activities around your home   9. Performing heavy activities around your home   10. Getting into/out of a car   11. Walking 2 blocks   12. Walking  1 mile   13. Going up/down 10 stairs (1 flight)   14. Standing for 1 hour   15.  sitting for 1 hour   16. Running on even ground   17. Running on uneven ground   18. Making sharp turns while running fast   19. Hopping    20. Rolling over in bed   Score total:  57     COGNITION: Overall cognitive status: Within functional limits for tasks assessed     SENSATION: WFL Impaired sensation on L middle toe   POSTURE: rounded shoulders and forward head  PALPATION: TTP L ankle in lateral, medial side, achilles, anterior tib, calf, anterior TC joint area   LOWER EXTREMITY ROM:  Active ROM Right eval Left eval  Hip  flexion wfl wfl  Hip extension    Hip abduction wfl wfl  Hip adduction wfl wfl  Hip internal rotation    Hip external rotation    Knee flexion wfl wfl  Knee extension wfl wfl  Ankle dorsiflexion wfl wfl  Ankle plantarflexion wfl wfl  Ankle inversion wfl wfl  Ankle eversion wfl wfl   (Blank rows = not tested)  LOWER EXTREMITY MMT:  MMT Right eval Left eval  Hip flexion 4- 4-  Hip extension    Hip abduction 4 4  Hip adduction 4 4  Hip internal rotation    Hip external rotation    Knee flexion 4- 4-  Knee extension 4+ 4+  Ankle dorsiflexion 4 4  Ankle plantarflexion 4+ 4+  Ankle inversion 4 4  Ankle eversion 4 4   (Blank rows = not tested)  LOWER EXTREMITY SPECIAL TESTS:   -lateral tilt -medial tilt -high ankle sprain ROM WFL (minimal pain) Some general ankle inv and ev tenderness and calf tenderness                                                                                                                                  TREATMENT DATE:  02/15/24 Therapeutic Exercise: Seated  BL ankle circles x8 CW/CCW, 2x8 GTB  Seated BL DF 2x8x3s, x6x3sGTB Seated blue TB clamshells 2x6x3s Seated GTB ankle eversion 2x6x3s  Therapeutic activity: HEP reassessment and update Standing calf raise x8x3s no UE support, 2x8x3s S UE support Standing march x8x3s S UE, x8 no UE support (contact guard)   Did not do: Standing weight shifts 2x8x3s w/ S UE support   [facility] Adult PT Treatment:                                                DATE: 01/27/24 Therapeutic Exercise: Standing knee flexion x10, 5 sec hold BLE with HHA for support Sit to Stand onto foam pad x15 with BUE hand use for push off Modified lunge in standing with unilateral HHA for  support Lateral step downs from foam 3x10 BLE alternating between sets 3 way hip in standing x10 BLE Gait training: Unilateral TM wlaking LLE on belt x2 mins 0.5-0.    01/25/24 STS 2x10 - no UE support L ankle DF/inv/ev 2x10  RTB Standing heel/toe 2x10 B UE Step ups fwd 2x10 L leading 8 in 1 UE Tandem stance at counter 3x30 L back FT on foam with head turns 2x30  Seated heel 2x15 15# KB L Standing hip abd 2x10 ea  01/13/24 Therapeutic Exercise: Seated BL DF x8x3s, 2x8x3s RTB Seated  BL ankle circles x8 CW/CCW, 2x8 RTB  Seated GTB clamshells 2x8x3s Seated RTB ankle eversion 2x8x3s  Therapeutic activity: HEP reassessment and update Standing calf raise x8x3s BL UE support, 2x8x3s S UE support Standing weight shifts 2x8x3s w/ S UE support     TREATMENT 01/05/2024:  Therapeutic Exercise: Calf raises x8x3s Seated BL DF x8x3s Seated circles CW/CCW x8   Self-care/Home Management: Patient educated on HEP, POC, prognosis, and relevant tissues/anatomy.     PATIENT EDUCATION:  Education details: HEP Person educated: Patient Education method: Programmer, Multimedia, Facilities Manager, and Handouts Education comprehension: verbalized understanding and returned demonstration  HOME EXERCISE PROGRAM: 5x/wk, 2x/day 2x8x3s Calf raises x8x3s (single UE support) GTB Seated BL DF x8x3s GTB Seated circles CW/CCW x8  Counter top marches x6    Access Code: [email or web address] URL: [email or web address] Date: 01/25/2024 Prepared by: Alm Kingdom  Exercises - Standing Tandem Balance with Counter Support  - 1 x daily - 7 x weekly - 2-3 sets - 30 sec hold - Corner Balance Feet Together: Eyes Open With Head Turns  - 1 x daily - 7 x weekly - 2-3 sets - 30 sec hold   ASSESSMENT:  CLINICAL IMPRESSION: Patient tolerated treatment with no increases in pain with progressions in LLE loading/stability alongside dynamic balance. Current deficits include: ankle functional tolerance and dynamic balance. As a result, patient would continue to benefit from skilled PT to address said deficits via plan below.      EVAL: Patient is a 83 year old female who presents with L ankle pain s/p rolling ankle trying to step on bug.  Patient presents with deficits in: excessive pain, ankle stability/control, strength, and overall activity tolerance. As a result, the patient would benefit from skilled PT to address aforementioned deficits via plan below.   OBJECTIVE IMPAIRMENTS: decreased balance, decreased strength, and pain.     PERSONAL FACTORS: Age, Time since onset of injury/illness/exacerbation, and 1-2 comorbidities:   are also affecting patient's functional outcome.   REHAB POTENTIAL: Good  CLINICAL DECISION MAKING: Stable/uncomplicated  EVALUATION COMPLEXITY: Moderate   GOALS: Goals reviewed with patient? No  SHORT TERM GOALS: Target date: 01/26/2024   1) Patient will demonstrate 75% HEP compliance to show independence with self-management of condition   Baseline: 0% Goal status: INITIAL  2) Patient will decrease worst pain to 7 at most to improve ADL completion and overall QOL   Baseline: 9 Goal status: INITIAL    LONG TERM GOALS: Target date: 03/06/24   1) Patient will demonstrate 100% HEP compliance to show independence with self-management of condition   Baseline: 0% Goal status: INITIAL  2) Patient will decrease worst pain to 5 at most to improve ADL completion and overall QOL   Baseline: 9 Goal status: INITIAL  3) Patient will demonstrate a 10 point improvement in LEFS to show improvements in ADL completion and overall QOL    Baseline: 57 Goal status: INITIAL  4) Patient will  be able to walk at least 80% capacity to demonstrate improvements in LLE functional activity tolerance, strength, control, and balance.    Baseline: 25% Goal status: INITIAL      PLAN:  PT FREQUENCY: 1-2x/week  PT DURATION: 8 weeks  PLANNED INTERVENTIONS: 97110-Therapeutic exercises, 97530- Therapeutic activity, [id number]- Neuromuscular re-education, 97535- Self Care, [id number]- Manual therapy, and Patient/Family education  PLAN FOR NEXT SESSION: HEP assessment and progression, symptom  modulation, and loading (isolated and/or functional). Manual therapy, aerobic, gait, and NME training as needed. Continue with ankle and LLE strengthening with dynamic balance training.    Washington Greener Kelsei Defino PT  02/15/2024 4:11 PM   "

## 2024-03-06 ENCOUNTER — Ambulatory Visit: Attending: Sports Medicine

## 2024-03-06 DIAGNOSIS — M25572 Pain in left ankle and joints of left foot: Secondary | ICD-10-CM | POA: Insufficient documentation

## 2024-03-06 DIAGNOSIS — M6281 Muscle weakness (generalized): Secondary | ICD-10-CM | POA: Diagnosis present

## 2024-03-06 NOTE — Therapy (Signed)
 " OUTPATIENT PHYSICAL THERAPY TREATMENT PHYSICAL THERAPY DISCHARGE SUMMARY  Visits from Start of Care: 6  Current functional level related to goals / functional outcomes: See below   Remaining deficits: See below   Education / Equipment: HEP   Patient agrees to discharge. Patient goals were partially met. Patient is being discharged due to being pleased with the current functional level.   Patient Name: Gina Guerrero MRN: [id number] DOB:11/30/1940, 84 y.o., female Today's Date: 03/06/2024  END OF SESSION:  PT End of Session - 03/06/24 1533     Visit Number 6    Number of Visits 16    Date for Recertification  03/06/24    Authorization Type [employer]    PT Start Time 1530    PT Stop Time 1610    PT Time Calculation (min) 40 min    Activity Tolerance Patient tolerated treatment well    Behavior During Therapy [facility] for tasks assessed/performed               Past Medical History:  Diagnosis Date   Asthma    Cataract    Hyperlipidemia    Hypertension    Osteopenia determined by x-ray 11/2018   low FRAX score   Tachycardia    Past Surgical History:  Procedure Laterality Date   ABDOMINAL HYSTERECTOMY     EYE SURGERY Bilateral 2013   TOE SURGERY     Patient Active Problem List   Diagnosis Date Noted   Obesity, morbid (HCC) 01/31/2024   Essential hypertension 08/06/2021   Dyslipidemia 08/06/2021   Prediabetes 04/23/2020   Osteopenia determined by x-ray 11/2018   Perennial allergic rhinitis 08/30/2018   Chronic cough 05/13/2016    PCP: [facility] Emil Schanz, MD PCP - General   REFERRING PROVIDER: Leonce Katz, DO Ref Provider   REFERRING DIAG:  [phone] (ICD-10-CM) - High ankle sprain, unspecified laterality, initial encounter  M79.672 (ICD-10-CM) - Left foot pain  M25.572 (ICD-10-CM) - Acute left ankle pain    THERAPY DIAG:  Muscle weakness (generalized)  Pain in left ankle and joints of left foot  Rationale for  Evaluation and Treatment: rehabilitation  ONSET DATE: acute  SUBJECTIVE:   SUBJECTIVE STATEMENT: Pt states that she has been doing well since her previous session, reports compliance with HEP. No pain today. Swelling is present but continuing to improve.  EVAL: Severe ankle sprain trying to step on a bug. Didn't fall but hurt ankle bad. Has been trying to keep it moving as often as possible. Ankle swells up at end of day after being on it for too long.  PERTINENT HISTORY: HTN, osteopenia, dizziness PAIN:  9W, 0C Are you having pain?  Yes: NPRS scale: 10 Pain location: Left foot and shin Pain description: sharp pain initially, dull/achy (tingling middle toe) Aggravating factors: excessive  Relieving factors: n/a  PRECAUTIONS: None  RED FLAGS: None   WEIGHT BEARING RESTRICTIONS: No  FALLS:  Has patient fallen in last 6 months? No  OCCUPATION:   PLOF: Independent  PATIENT GOALS: walk without pain, decrease pain  OBJECTIVE:  Note: Objective measures were completed at Evaluation unless otherwise noted.   PATIENT SURVEYS:  LEFS  Extreme difficulty/unable (0), Quite a bit of difficulty (1), Moderate difficulty (2), Little difficulty (3), No difficulty (4) Survey date:    Any of your usual work, housework or school activities   2. Usual hobbies, recreational or sporting activities   3. Getting into/out of the bath   4. Walking between rooms  5. Putting on socks/shoes   6. Squatting    7. Lifting an object, like a bag of groceries from the floor   8. Performing light activities around your home   9. Performing heavy activities around your home   10. Getting into/out of a car   11. Walking 2 blocks   12. Walking 1 mile   13. Going up/down 10 stairs (1 flight)   14. Standing for 1 hour   15.  sitting for 1 hour   16. Running on even ground   17. Running on uneven ground   18. Making sharp turns while running fast   19. Hopping    20. Rolling over in bed   Score  total:  57, 63     COGNITION: Overall cognitive status: Within functional limits for tasks assessed     SENSATION: WFL Impaired sensation on L middle toe   POSTURE: rounded shoulders and forward head  PALPATION: TTP L ankle in lateral, medial side, achilles, anterior tib, calf, anterior TC joint area   LOWER EXTREMITY ROM:  Active ROM Right eval Left eval  Hip flexion wfl wfl  Hip extension    Hip abduction wfl wfl  Hip adduction wfl wfl  Hip internal rotation    Hip external rotation    Knee flexion wfl wfl  Knee extension wfl wfl  Ankle dorsiflexion wfl wfl  Ankle plantarflexion wfl wfl  Ankle inversion wfl wfl  Ankle eversion wfl wfl   (Blank rows = not tested)  LOWER EXTREMITY MMT: Reassessed 03/06/24 MMT Right eval Left eval  Hip flexion 4-, 4 4-, 4  Hip extension    Hip abduction 4, 4+ 4, 4+  Hip adduction 4, 4+ 4, 4+  Hip internal rotation    Hip external rotation    Knee flexion 4-, 4 4-, 4  Knee extension 4+ 4+  Ankle dorsiflexion 4 4  Ankle plantarflexion 4+ 4+  Ankle inversion 4 4  Ankle eversion 4 4   (Blank rows = not tested)  LOWER EXTREMITY SPECIAL TESTS:   -lateral tilt -medial tilt -high ankle sprain ROM WFL (minimal pain) Some general ankle inv and ev tenderness and calf tenderness                                                                                                                                  TREATMENT DATE:  03/06/24: Therapeutic Activity: Discharge note; fxnal reassessment and goal update    02/15/24 Therapeutic Exercise: Seated  BL ankle circles x8 CW/CCW, 2x8 GTB  Seated BL DF 2x8x3s, x6x3sGTB Seated blue TB clamshells 2x6x3s Seated GTB ankle eversion 2x6x3s  Therapeutic activity: HEP reassessment and update Standing calf raise x8x3s no UE support, 2x8x3s S UE support Standing march x8x3s S UE, x8 no UE support (contact guard)   Did not do: Standing weight shifts 2x8x3s w/ S UE  support   [facility] Adult PT Treatment:  DATE: 01/27/24 Therapeutic Exercise: Standing knee flexion x10, 5 sec hold BLE with HHA for support Sit to Stand onto foam pad x15 with BUE hand use for push off Modified lunge in standing with unilateral HHA for support Lateral step downs from foam 3x10 BLE alternating between sets 3 way hip in standing x10 BLE Gait training: Unilateral TM wlaking LLE on belt x2 mins 0.5-0.    01/25/24 STS 2x10 - no UE support L ankle DF/inv/ev 2x10 RTB Standing heel/toe 2x10 B UE Step ups fwd 2x10 L leading 8 in 1 UE Tandem stance at counter 3x30 L back FT on foam with head turns 2x30  Seated heel 2x15 15# KB L Standing hip abd 2x10 ea  01/13/24 Therapeutic Exercise: Seated BL DF x8x3s, 2x8x3s RTB Seated  BL ankle circles x8 CW/CCW, 2x8 RTB  Seated GTB clamshells 2x8x3s Seated RTB ankle eversion 2x8x3s  Therapeutic activity: HEP reassessment and update Standing calf raise x8x3s BL UE support, 2x8x3s S UE support Standing weight shifts 2x8x3s w/ S UE support     TREATMENT 01/05/2024:  Therapeutic Exercise: Calf raises x8x3s Seated BL DF x8x3s Seated circles CW/CCW x8   Self-care/Home Management: Patient educated on HEP, POC, prognosis, and relevant tissues/anatomy.     PATIENT EDUCATION:  Education details: HEP Person educated: Patient Education method: Programmer, Multimedia, Facilities Manager, and Handouts Education comprehension: verbalized understanding and returned demonstration  HOME EXERCISE PROGRAM: 5x/wk, 2x/day 2x8x3s Calf raises x8x3s (single UE support) GTB Seated BL DF x8x3s GTB Seated circles CW/CCW x8  Counter top marches x6    Access Code: [email or web address] URL: [email or web address] Date: 01/25/2024 Prepared by: Alm Kingdom  Exercises - Standing Tandem Balance with Counter Support  - 1 x daily - 7 x weekly - 2-3 sets - 30 sec hold - Corner Balance Feet Together:  Eyes Open With Head Turns  - 1 x daily - 7 x weekly - 2-3 sets - 30 sec hold   ASSESSMENT:  CLINICAL IMPRESSION: Patient met most of goals and is pleased with progress alongside with being independent with HEP. Remaining deficits are: n/a. As a result, Pt was discharged from PT.      EVAL: Patient is a 84 year old female who presents with L ankle pain s/p rolling ankle trying to step on bug. Patient presents with deficits in: excessive pain, ankle stability/control, strength, and overall activity tolerance. As a result, the patient would benefit from skilled PT to address aforementioned deficits via plan below.   OBJECTIVE IMPAIRMENTS: decreased balance, decreased strength, and pain.     PERSONAL FACTORS: Age, Time since onset of injury/illness/exacerbation, and 1-2 comorbidities:   are also affecting patient's functional outcome.   REHAB POTENTIAL: Good  CLINICAL DECISION MAKING: Stable/uncomplicated  EVALUATION COMPLEXITY: Moderate   GOALS: Goals reviewed with patient? No  SHORT TERM GOALS: Target date: 01/26/2024   1) Patient will demonstrate 75% HEP compliance to show independence with self-management of condition   Baseline: 0% Goal status: MET  2) Patient will decrease worst pain to 7 at most to improve ADL completion and overall QOL   Baseline: 9 Goal status: MET    LONG TERM GOALS: Target date: 03/06/24   1) Patient will demonstrate 100% HEP compliance to show independence with self-management of condition   Baseline: 0% Goal status: MET  2) Patient will decrease worst pain to 5 at most to improve ADL completion and overall QOL   Baseline: 9 Goal status: MET  3) Patient will demonstrate a 10 point improvement in  LEFS to show improvements in ADL completion and overall QOL    Baseline: 57 Goal status: NOT MET  4) Patient will be able to walk at least 80% capacity to demonstrate improvements in LLE functional activity tolerance, strength,  control, and balance.    Baseline: 25% Goal status: MET      PLAN:  PT FREQUENCY: 1-2x/week  PT DURATION: 8 weeks  PLANNED INTERVENTIONS: 97110-Therapeutic exercises, 97530- Therapeutic activity, [id number]- Neuromuscular re-education, 97535- Self Care, 02859- Manual therapy, and Patient/Family education  PLAN FOR NEXT SESSION: patient discharged.     [place] Greener Nohemi Nicklaus PT  03/06/2024 3:59 PM   "

## 2024-05-18 ENCOUNTER — Ambulatory Visit
